# Patient Record
Sex: Female | Born: 1995 | Race: Black or African American | Hispanic: No | Marital: Single
Health system: Midwestern US, Academic
[De-identification: ages and names within clinical notes are randomized; demographics above are authoritative.]

---

## 2016-08-25 ENCOUNTER — Encounter: Admit: 2016-08-25 | Discharge: 2016-08-25 | Payer: MEDICARE

## 2016-09-01 ENCOUNTER — Encounter: Admit: 2016-09-01 | Discharge: 2016-09-01 | Payer: MEDICARE

## 2016-09-02 ENCOUNTER — Encounter: Admit: 2016-09-02 | Discharge: 2016-09-02 | Payer: MEDICARE

## 2016-09-02 NOTE — Telephone Encounter
New Referral Intake  TO PATIENT:  Thank you for contacting us, how did  you hear about our transplant program? [facility]      Is English your primary language? Yes      Who is your kidney doctor?  Dr. Mercie Eon  Who is your primary doctor?  Arlys John Tagget   Have you had a hospitalization in the past 6 months?  Yes, in [place] - [facility] - infection from PD     Parathyroidectomy - 2 or 3 years ago at [facility]: Lambert Rhonda W     Past Medical History:  (if answer is yes, please specify)    How tall are you?  5 5  How much do you weigh? 238    Calculated BMI:  39.6   What is the cause of your kidney disease? Kidneys stopped growing at 21years old , did biopsy didn't find anything   Are you currently on dialysis?  Yes    Type/Start date/ Schedule:  PD/08/04/2007   What center: [facility]    Are you diabetic?  No     at what age were you diagnosed?  Have you been denied or listed by another transplant center?  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   No   Do you have a heart doctor? No   History of heart stents?  No    Are you on medication to keep stents open (ex. Coumadin, Plavix, Brilinta)? No    Are you on medication to help raise your blood pressure (Midodrine)? No    Lung disease? No    Are you on oxygen? No    Do you wear CPAP (breathing machine at night to sleep)? CPAP in [place], currently not using it    Are you currently smoking (marijuana or cigerettes)? No   TO PATIENT: If you currently smoke, you need to quit to be considered a transplant candidate.  Liver disease?  No      Cancer? No   Any active infections? No     History of HIV or Hepatitis? No     open wounds or sores? 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  Yes in [place] - [place] Do you use a cane/walker/ other assistive devices to get around? No     Past Surgical History:    Have you had any abdominal surgeries? PD Cath. , Removed 2x and replaced. Previous kidney txp   Have you had any surgeries on blood vessels? No   Have you had any amputations?  No   Would you accept a lifesaving blood transfusion? Yes     Demographic and Social History:    What gender were you at birth?  Female   What gender do you identify with currently? Female   What race do you identify with? 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 No    If so, they are welcome to come to evaluation with you.    TO PATIENT: You will need to bring your support person with you to the transplant evaluation.  This is required to be considered a candidate for transplant.  If you have interested living donors, they are welcome to come with you, as well.       Note to staff -   Obtain the following records:  ??? nephrology note  ??? hospital discharge summary within 6 month (or most recent)   ??? pathology report    Notify Dr. Curt Bears if delay in scheduling due to lack of records     If patient has possible living donor - have them call:  ??? If recipient last name, Forrest Moron, [phone]  ??? If recipient last name Kelli Churn, [phone]

## 2016-09-08 ENCOUNTER — Encounter: Admit: 2016-09-08 | Discharge: 2016-09-08 | Payer: MEDICARE

## 2016-10-05 ENCOUNTER — Encounter: Admit: 2016-10-05 | Discharge: 2016-10-05 | Payer: MEDICARE

## 2016-10-05 DIAGNOSIS — N186 End stage renal disease: Principal | ICD-10-CM

## 2016-10-07 ENCOUNTER — Encounter: Admit: 2016-10-07 | Discharge: 2016-10-07 | Payer: MEDICARE

## 2016-10-08 ENCOUNTER — Encounter: Admit: 2016-10-08 | Discharge: 2016-10-08 | Payer: MEDICARE

## 2016-10-08 NOTE — Telephone Encounter
BENEFIT COLLECTION:     Verified by: Barbaraann Faster         Date:  October 08, 2016  ID #: [id number]    EFF:10/10/07 A&B   Subscriber:Self    Ins Plan: Medicare A&B    Phone#:[phone]   Plan Type:Traditional  MEDICARE A&B - 2018 BENEFIT SUMMARY:  PART A: DAYS REFRESH AFTER 60 CONSECUTIVE OUTPT DAYS  DAYS 1-60 $1340 DEDUCTIBLE  DAYS 61-90 $335/DAY COPAY  DAYS 91-150 (USING ANY LIFETIME RESERVE DAYS) $670/DAY COPAY  DAYS > 150 PT PORTION 100%; after a 3-day minimum medically necessary inpatient hospital stay for a related illness or injury.  PART A/ SNF BENEFITS: IN 2018: DAYS 1-20 PT PORTION $0; DAYS 21-100 $167.50/DAY COPAY; DAYS > 100 PT PORTION 100%  PART B IN 2018: - 2018 PREM $134  DEDUCTIBLE $183, WITH 80% REIMBURSEMENT OF ALLOWABLE, NO OUT OF POCKET MAXIMUM  MEDICARE WILL PAY FOR DRUGS INFUSED THROUGH AN ITEM OF DME, LIKE AN INFUSION PUMP, OR DRUGS GIVEN BY A NEBULIZER WHEN GIVEN BY A LICENSED MEDICAL PROVIDER.  PART B WILL COVER IMMUNOSUPPRESSIVE DRUGS IF [facility] COVERED THE TRANSPLANT, EVEN AS SECONDARY PAYER  No Case Management, No transplant Network, No prior authorizations, No benefit limits    RX Plan:[employer]etna       Phone #: [phone]   Drug Copay/ Coinsurance: $1.25 - $3.70   Current Subsidy: Full Benefit Dual Eligible Effective 02/09/16  Monthly Premium: $0  Annual Drug Deductible: $0     SECONDARY  ID #: [id number]   Subscriber:Self   Ins Plan:[employer]     Phone#:[phone]   Plan Type:[employer]  KANCARE (prev [place] Medicaid): No Spend Down as of 10/08/16  $48 INPT ADMIT COPAY  NURSING HOME/LONG TERM CARE [phone]  OUTPT HOSPITAL $3/VISIT  SURGICENTER $3/DOS  OUTPT REHAB $1/VISIT  DME $3/CLAIM  HHC $3/VISIT  NON-EMERGENCY TRANSPORT OR AMBULANCE $3/DOS  OV $2/COPAY  MENTAL HEALTH CALL [facility] [phone], $3/VISIT  DENTAL $3/DOS  RX $3/FILL (NO MAIL ORDER)  If 2ndry to Medicare: PAYS [place] FOR IMMUNOS AFTER PART B  **IS IT COVERED? CALL [phone]  NO AUTHORIZATION REQUIRED WHEN SECONDARY Medicare A&B : Benefits verified/appropriate for evaluation with Medicare A&B and [employer]. No auth required to activate.

## 2016-10-12 ENCOUNTER — Encounter: Admit: 2016-10-12 | Discharge: 2016-10-12 | Payer: MEDICARE

## 2016-10-13 ENCOUNTER — Encounter: Admit: 2016-10-13 | Discharge: 2016-10-13 | Payer: MEDICARE

## 2016-10-13 DIAGNOSIS — Z01818 Encounter for other preprocedural examination: Principal | ICD-10-CM

## 2016-10-19 ENCOUNTER — Encounter: Admit: 2016-10-19 | Discharge: 2016-10-19 | Payer: MEDICARE

## 2016-11-11 ENCOUNTER — Encounter: Admit: 2016-11-11 | Discharge: 2016-11-11 | Payer: MEDICARE

## 2016-11-11 NOTE — Telephone Encounter
TC from [place], RN at Dr Packett's office. Left VM , has questions, provided callback number.    TC to [place], Charity fundraiser. In a room with a patient, left callback number.

## 2016-11-16 ENCOUNTER — Encounter: Admit: 2016-11-16 | Discharge: 2016-11-16 | Payer: MEDICARE

## 2016-11-16 NOTE — Progress Notes
Interventional Radiology Outpatient Scheduling Checklist      1.  Procedure:   Portacath placement      2.  Date of Procedure:   11/22/16      3.  Arrival Time:   0645      4.  Procedure Time:  0800      5.  Correct Procedural Room Assignment:  Bell Room 3      6.  Blood Thinners Triaged and instructed per protocol: N/A      7.  Order Verified: Yes      8.  Patient informed regarding procedure: Yes      9.  Patient instructed to have a driver:  Yes     10.  Patient instructed on NPO status: No solids after midnight, clear liquids until 0600, NPO from 0600 until after procedure     11.  Specimen needed: Y/N: NA     12.  Allergies Verified:  Y/N:  Yes     13.  Iodine Allergy: Y/N:  If yes and receiving Iodine has the patient been premedicated: Y/N: No     14.  Does the patient have labs according to IR procedural policy: Y/N: [facility] dialysis is drawing labs 11/17/16, will fax results to IR     15.  Will the patient need to be admitted/possible admission: Y/N:   NA     16.  If YES to possible admission has the patient been instructed: Y/N: NA     17.  Patient States Understanding:  Y/N  Yes     18.  Hx OSA:  Y/N: Pt instructed to bring PAP Machine:Yes, had a CPAP but left it in [place]

## 2016-11-22 ENCOUNTER — Ambulatory Visit: Admit: 2016-11-22 | Discharge: 2016-11-22 | Payer: MEDICARE

## 2016-11-22 DIAGNOSIS — N186 End stage renal disease: Principal | ICD-10-CM

## 2016-11-22 DIAGNOSIS — I871 Compression of vein: ICD-10-CM

## 2016-11-22 DIAGNOSIS — Z4901 Encounter for fitting and adjustment of extracorporeal dialysis catheter: Secondary | ICD-10-CM

## 2016-11-22 MED ORDER — MIDAZOLAM 1 MG/ML IJ SOLN
1 mg | Freq: Once | INTRAVENOUS | 0 refills | Status: CP
Start: 2016-11-22 — End: ?
  Administered 2016-11-22: 14:00:00 1 mg via INTRAVENOUS

## 2016-11-22 MED ORDER — MIDAZOLAM 1 MG/ML IJ SOLN
0 refills | Status: CP
Start: 2016-11-22 — End: ?
  Administered 2016-11-22 (×2): 1 mg via INTRAVENOUS

## 2016-11-22 MED ORDER — IOPAMIDOL 61 % IV SOLN
20 mL | Freq: Once | INTRAVENOUS | 0 refills | Status: CP
Start: 2016-11-22 — End: ?
  Administered 2016-11-22: 16:00:00 20 mL via INTRAVENOUS

## 2016-11-22 MED ORDER — ONDANSETRON HCL (PF) 4 MG/2 ML IJ SOLN
4 mg | Freq: Once | INTRAVENOUS | 0 refills | Status: CP
Start: 2016-11-22 — End: ?
  Administered 2016-11-22: 16:00:00 4 mg via INTRAVENOUS

## 2016-11-22 MED ORDER — FENTANYL CITRATE (PF) 50 MCG/ML IJ SOLN
0 refills | Status: CP
Start: 2016-11-22 — End: ?
  Administered 2016-11-22 (×2): 50 ug via INTRAVENOUS

## 2016-11-22 MED ORDER — CEFAZOLIN INJ 1GM IVP
2 g | Freq: Once | INTRAVENOUS | 0 refills | Status: CP
Start: 2016-11-22 — End: ?
  Administered 2016-11-22: 16:00:00 2 g via INTRAVENOUS

## 2016-11-22 MED ORDER — FENTANYL CITRATE (PF) 50 MCG/ML IJ SOLN
50 ug | Freq: Once | INTRAVENOUS | 0 refills | Status: CP
Start: 2016-11-22 — End: ?
  Administered 2016-11-22: 14:00:00 50 ug via INTRAVENOUS

## 2016-11-22 NOTE — Progress Notes
Sedation physician present in room.  Recent vitals and patient condition reviewed between sedating physician and nurse.  Reassessment completed.  Determination made to proceed with planned sedation.

## 2016-11-22 NOTE — Progress Notes
OK to be d/c'd home per MD order protocol. Pt is awake and alert. Denies pain. O2 sats >97% on RA. NSR on telemetry. Tolerating food and fluids well. Dressing is C/D/I. Pt verbalized understanding of d/c instructions. Advised to call the MD for any questions or concerns or return to ER if fever or significant pain develops. Family to drive pt home. Pt will have IV removed and will be transported downstairs to lobby via wheelchair with all belongings.

## 2016-11-22 NOTE — Other
Immediate Post Procedure Note    Date:  11/22/2016                                         Attending Physician:   Levora Angel, MD  Performing Provider:  Merla Riches, DO    Consent:  Consent obtained from patient.  Time out performed: Consent obtained, correct patient verified, correct procedure verified, correct site verified, patient marked as necessary.  Indications: ESRD, need for venous access    Anesthesia: Conscious Sedation  Procedure(s):  portacath placement  Findings:  Right and left IJs occluded. Placed via right innominate vein. See PACS dictation for additional description.     Estimated Blood Loss:  Minimal  Specimen(s) Removed/Disposition:  None  Complications: None  Patient Tolerated Procedure: Well  Post-Procedure Condition:  stable    Merla Riches, DO  Pager [phone]

## 2016-11-22 NOTE — H&P (View-Only)
Pre Procedure History and Physical/Sedation Plan-OP    Procedure Date: 11/22/2016     Planned Procedure(s):  Port placement    Indication for exam:  End Stage Renal disease, need for venous access   __________________________________________________________________    Chief Complaint:  See above    History of Present Illness: Debra Lam is a 21 y.o. female that presents to IR today for port placement. Patient denies complaint on exam.     There are no active problems to display for this patient.    No past medical history on file.   No past surgical history on file.   Prescriptions Prior to Admission   Medication Sig Dispense Refill Last Dose   ??? amLODIPine (NORVASC) 10 mg tablet Take 10 mg by mouth daily.   11/22/2016   ??? calcitriol (ROCALTROL) 0.5 mcg capsule Take 0.5 mcg by mouth twice daily.   11/22/2016   ??? calcium acetate (PHOSLO) 667 mg capsule Take 1,334 mg by mouth three times daily with meals.      ??? carvedilol (COREG) 25 mg tablet Take 25 mg by mouth twice daily with meals. Take with food.   Past Week   ??? fosinopril (MONOPRIL) 20 mg tablet Take 20 mg by mouth daily.   11/22/2016   ??? polyethylene glycol 3350 (MIRALAX) 17 g packet Take 17 g by mouth daily.      ??? potassium chloride SR (K-DUR) 10 mEq tablet Take 10 mEq by mouth daily. Take with a meal and a full glass of water.   11/22/2016   ??? trimethoprim/sulfamethoxazole (BACTRIM SS) 80/400 mg tablet Take 1 tablet by mouth daily.   11/22/2016     Allergies   Allergen Reactions   ??? Advair Diskus [Fluticasone-Salmeterol] ANAPHYLAXIS   ??? Adhesive Tape (Rosins) RASH   ??? Coumadin [Warfarin] RASH       Social History:   Social History   Substance Use Topics   ??? Smoking status: Not on file   ??? Smokeless tobacco: Not on file   ??? Alcohol use Not on file      No family history on file.     Review of Systems  Constitutional: negative  Respiratory: negative  Cardiovascular: negative  Gastrointestinal: negative Previous Anesthetic/Sedation History:  Patient denies adverse event.     Physical Exam:  Vital Signs: Last Filed In 24 Hours Vital Signs: 24 Hour Range   BP: 141/92 (10/15 0752)  Temp: 36.7 ???C (98.1 ???F) (10/15 0752)  Pulse: 98 (10/15 0752)  Respirations: 18 PER MINUTE (10/15 0752)  SpO2: 100 % (10/15 0752)  O2 Delivery: None (Room Air) (10/15 0752)  Height: 160 cm (63) (10/15 0752) BP: (141)/(92)   Temp:  [36.7 ???C (98.1 ???F)]   Pulse:  [98]   Respirations:  [18 PER MINUTE]   SpO2:  [100 %]   O2 Delivery: None (Room Air)            General appearance: alert and cooperative  Head and Neck: no abnormalities noted  Mouth: no abnormalities noted   Neurologic: Grossly normal  Lungs: nonlabored  Heart: regular rate and rhythm        Airway:  airway assessment performed  Mallampati III (soft palate, base of uvula visible)   Anesthesia Classification:  ASA III (A patient with a severe systemic disease that limits activity, but is not incapacitating)  Sedation/Medication Plan: Fentanyl and Midazolam  Personal history of sedation complications: Denies adverse event.   Family history of sedation complications: Denies adverse  event.   Medications for Reversal: Naloxone and Flumazenil  Discussion/Reviews:  Physician has discussed risks and alternatives of this type of sedation and above planned procedures with patient  NPO Status: Acceptable  Pregnancy Status: Denies chance of pregnancy        Lab/Radiology/Other Diagnostic Tests:  Labs:  Pertinent labs reviewed - hgb 9.1 on 10/10           Ski Polich Nugent, [facility]  Pager [phone]

## 2016-12-01 ENCOUNTER — Encounter: Admit: 2016-12-01 | Discharge: 2016-12-01 | Payer: MEDICARE

## 2016-12-06 ENCOUNTER — Encounter: Admit: 2016-12-06 | Discharge: 2016-12-06 | Payer: MEDICARE

## 2016-12-06 ENCOUNTER — Ambulatory Visit: Admit: 2016-12-06 | Discharge: 2016-12-06 | Payer: MEDICARE

## 2016-12-06 DIAGNOSIS — E1129 Type 2 diabetes mellitus with other diabetic kidney complication: ICD-10-CM

## 2016-12-06 DIAGNOSIS — N186 End stage renal disease: Principal | ICD-10-CM

## 2016-12-06 DIAGNOSIS — Z01818 Encounter for other preprocedural examination: ICD-10-CM

## 2016-12-06 DIAGNOSIS — Z114 Encounter for screening for human immunodeficiency virus [HIV]: ICD-10-CM

## 2016-12-06 DIAGNOSIS — Z0389 Encounter for observation for other suspected diseases and conditions ruled out: ICD-10-CM

## 2016-12-06 LAB — COMPREHENSIVE METABOLIC PANEL
Lab: 11 U/L (ref 7–56)
Lab: 137 MMOL/L (ref 137–147)
Lab: 14 U/L (ref 7–40)
Lab: 3.8 g/dL (ref 3.5–5.0)
Lab: 5.3 MMOL/L — ABNORMAL HIGH (ref 3.5–5.1)
Lab: 7.9 g/dL (ref 6.0–8.0)

## 2016-12-06 LAB — CBC AND DIFF
Lab: 28 % — ABNORMAL LOW (ref 36–45)
Lab: 29 pg (ref 26–34)
Lab: 3.1 M/UL — ABNORMAL LOW (ref 4.0–5.0)
Lab: 4.7 10*3/uL (ref 4.5–11.0)

## 2016-12-06 LAB — PROTIME INR (PT): Lab: 1.1 (ref 0.8–1.2)

## 2016-12-06 LAB — PHOSPHORUS: Lab: 10 mg/dL — ABNORMAL HIGH (ref >60)

## 2016-12-06 NOTE — Telephone Encounter
Financial Transplant Evaluation    Debra Lam met with Debra Lam, mom, brother and sister in law  for insurance consult. There appear to be no coverage/insurance barriers to transplant with Medicare ABD full subsidized with [employer] no spend down plan. Debra Lam expressed no insurance concerns related to cost of post-transplant care and medications and verbalized understanding of Transplant Finance Coordinator's discussion and documents presented. Advised patient to call me if any insurance changes. Provided patient with my contact information. Patient verbalized understanding. NO Financial Assessment given due to NO financial liabilities. NO Authorization required prior to listing.    Documents presented:  Signed Patient Liability Form, Medication Cost Estimate. MM 12/06/16

## 2016-12-06 NOTE — Progress Notes
Pharmacy Kidney Transplant Evaluation   I met with Debra Lam for their pharmacy transplant evaluation. A medication history and reconciliation were performed (including prescription medications, supplements, over the counter, and herbal products). The medication list was updated and the patient???s current medication list is included below.     Home Medications    Medication Sig   amLODIPine (NORVASC) 10 mg tablet Take 10 mg by mouth daily.   calcitriol (ROCALTROL) 0.5 mcg capsule Take 1 mcg by mouth daily.   calcium acetate (PHOSLO) 667 mg capsule Take 1,334 mg by mouth three times daily with meals.   carvedilol (COREG) 25 mg tablet Take 25 mg by mouth twice daily with meals. Take with food.   cetirizine (ZYRTEC) 5 mg tablet Take 5 mg by mouth every morning.   fosinopril (MONOPRIL) 40 mg tablet Take 40 mg by mouth daily.   polyethylene glycol 3350 (MIRALAX) 17 g packet Take 17 g by mouth daily as needed.   potassium chloride SR (K-DUR) 10 mEq tablet Take 10 mEq by mouth daily. Take with a meal and a full glass of water.        Medication Adherence:   Key (0= Never, 1= Rarely, 2= Sometimes, 3= Often, 4= Always)    Method utilized to manage medications: Pillbox for whole     How often do you forget to take one or more of your prescription medications? 0, states lately hasn't forgotten to take medication    How often do you decide not to take one or more of your prescription medications? 0    How often do you run out of one or more of your prescription medications before getting a new refill? 1, usually doesn't run out of medications but has been out of calcitriol not that long (about a week) but has not called to follow up with medications. Other medications are on automatic refill.     How often do you have difficulty swallowing medications or taking your medications? 0    How often do you have difficulty affording your prescription medications? 0 How often does your pharmacy have difficulty filing your prescription medications? 1, [facility] delayed calcitriol delivery; other medications are on automatic     Patient Education:  During this visit the patient was provided with an overview of therapies that will be initiated post-transplant, including but not limited to significant side effects and dosing schedules.Because this patient is a female of child-bearing potential, there is risk for fetal toxicity associated with mycophenolate use during pregnancy. REMS acknowledgement and documentation were previously completed and the patient was reminded of the importance of pregnancy prevention while taking mycophenolate.    Emphasis was placed on the importance of medication adherence both pre and post-transplant.  The patient was encouraged to contact the transplant pharmacist with any follow up questions.   Debra Lam expressed understanding of the information discussed.     Assessment:  Comments: Patient reports good medication compliance however she has not been taking calcitriol for at least a week and has not followed up with her pharmacy to obtain further refills. Patient also has a history of prior transplant however family and patient did not remember any medications patient was on previously.  Potential Drug Interactions: None  Potential Barriers to Transplantation: Medication compliance  Recommendation: Medication compliance should be address prior to transplantation.    Nonnie Done, PharmD, BCPS  Pager: [phone]  Office: [phone]

## 2016-12-06 NOTE — Progress Notes
12/06/16      Debra Lam    Dear Dr. Mercie Lam,    We had pleasure of meeting Debra Lam in our [facility] for evaluation for Kidney transplant. Enclosed are details from our visit.    HPI:    Debra Lam is a 21 y.o. 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 We do not have these records yet.     Mother, sister in law and brother are present for transplant evaluation today. Patient previously lived in [place], [place]. Moved to [facility] area in April 2018 to live with brother.     She was evaluated at [facility] but not listed for kidney transplant due to BMI of 38. BMI here today is 119.7 kg due to additional fluid gains. She performed echocardiogram but did not complete stress test (was not NPO).    Last hospitalization was 10/15 for port placement. She has limited peripheral venous access. Currently receiving IV iron.     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 One daytime well  EDW: Was 105 kg. Fluid gains have been increasing due to low prescription bags. Current weight 119.7 kg.   Urine output: Nil    History of PE   2016, likely provoked while on OCP  Allergy listed to coumadin  Was on eliquis, not currently on anticoagulation  No hypercoagulable panel reported    Sleep apnea  Has machine but needs tubing replacement    History of parathyroidectomy   2016  On phoslo    H/o perirectal abscess 2016  H/o perineal abscess- req I&D/ IV abx 2017    Childhood asthma, resolved  Currently on zyrtect    PUD  Endoscopy 2017, duodenitis    Screening  Pap smear October 2018, reported as normal Past Surgical History  PD catheter  Right chest port  kidney transplant  Parathyroidectomy    REVIEW OF SYSTEMS   Pre-Transplant Considerations:  CAD: No  Stroke: No  Cancer: No}  Liver Disease/Hepatitis:No  OSA/Pulmonary HTN: Yes  Midodrine use: No  Sensitizing Events: Transfusion, transplant  DVT: Yes, PE, on OCP  Nephrotic Syndrome:No  Weight Loss needed: Yes, goal 105 kg    General: Denies fever/chills  Skin: Denies rash. +dry skin  HENT: Denies cough, visual changes  Pulmonary: Denies dyspnea, wheezes  Gastrointestinal: Denies GERD and Diarrhea. History of duodenitis on EGD, currently asymptomatic  Genitourinary: Denies urinary tract infection and hematuria  Neurologic: Denies Weakness and Stroke  Psychological: Denies Depression and Anxiety  Hematologic/Lymphatic: Denies Bleeding. History of dvt/pe  Viral infections/ID: Denies history of shingles and herpes    Exercise Tolerance:  Debra Lam does not exercise routinely. She is able to ascend 2 flights of stairs without chest pain, shortness of breath, orthopnea, PND, claudication or resting leg pain.    All other pertinent ROS as above or negative      Mammogram Not applicable    Pap Smear Yes  Last performed: October 2018    Social History  Social History     Social History   ??? Marital status: Single     Spouse name: N/A   ??? Number  of children: 0   ??? Years of education: 65     Occupational History   ???       unemployed     Social History Main Topics   ??? Smoking status: Never Smoker   ??? Smokeless tobacco: Never Used   ??? Alcohol use No   ??? Drug use: No   ??? Sexual activity: Yes     Other Topics Concern   ??? Blood Transfusions Yes   ??? Exercise No     Social History Narrative   ??? No narrative on file       Family History  Family History   Problem Relation Age of Onset   ??? Hypertension Mother    ??? None Reported Brother          Allergies:   Allergies   Allergen Reactions   ??? Advair Diskus [Fluticasone-Salmeterol] ANAPHYLAXIS and HIVES ??? Adhesive Tape (Rosins) RASH   ??? Coumadin [Warfarin] RASH     Bleeding lesions           Current Outpatient Prescriptions:   ???  amLODIPine (NORVASC) 10 mg tablet, Take 10 mg by mouth daily., Disp: , Rfl:   ???  calcitriol (ROCALTROL) 0.5 mcg capsule, Take 1 mcg by mouth daily., Disp: , Rfl:   ???  calcium acetate (PHOSLO) 667 mg capsule, Take 1,334 mg by mouth three times daily with meals., Disp: , Rfl:   ???  carvedilol (COREG) 25 mg tablet, Take 25 mg by mouth twice daily with meals. Take with food., Disp: , Rfl:   ???  cetirizine (ZYRTEC) 5 mg tablet, Take 5 mg by mouth every morning., Disp: , Rfl:   ???  fosinopril (MONOPRIL) 40 mg tablet, Take 40 mg by mouth daily., Disp: , Rfl:   ???  polyethylene glycol 3350 (MIRALAX) 17 g packet, Take 17 g by mouth daily as needed., Disp: , Rfl:   ???  potassium chloride SR (K-DUR) 10 mEq tablet, Take 10 mEq by mouth daily. Take with a meal and a full glass of water., Disp: , Rfl:     CBC w/Diff    No results found for: WBC, RBC, HGB, HCT, MCV, MCH, MCHC, RDW, PLTCT, MPV No results found for: NEUT, ANC, LYMA, ALC, MONA, AMC, EOSA, AEC, BASA, ABC     Comprehensive Metabolic Profile    No results found for: NA, K, CL, CO2, GAP, BUN, CR, GLU No results found for: CA, PO4, ALBUMIN, TOTPROT, ALKPHOS, AST, ALT, TOTBILI, GFR, GFRAA     Urine    No results found for: UCOLOR, TURBID, USPGR, UPH, UPROTEIN, UAGLU, UKET, UBILE, UBLD, UROB No results found for: UNIT, ULEU, SULFSALAC, ICTOTEST, EHRLICH, UWBC, URBC, UCOMMENT     No results found for this or any previous visit.    Physical Exam:  Vitals:    12/06/16 1229 12/06/16 1230   BP: 136/75 118/74   Pulse: 81 86   Temp: 36.3 ???C (97.4 ???F)    TempSrc: Oral    SpO2: 100%    Weight: 119.7 kg (264 lb)    Height: 162.6 cm (64)      General:NAD, Alert and Oriented x3, calm and pleasant  HEENT exam: Moist Mucus Membranes, No oral lesions  Neck: Normal ROM, no LAD, no JVD. No carotid bruit is present  Heart: RRR, S1, S2, without murmur, rub, or gallop Lungs: Clear to auscultation bilaterally without wheezes or rhonchi  Abdomen: Soft, large pannus. Non tender abdomen without hepatosplenomegaly  RLQ retained allograft.  Neuro: Nonfocal deficits without tremors  M/S: Normal ROM and strength  Ext: 1+ edema is present in the lower extremities.   2+ dorsalis pedis pulses are present  Vascular: 2+ femoral pulses are present  Psyc: Appropriate affect and mood         Assessment and Plan:    Debra Lam is a 21 y.o. year old with ESRD from congenital/hypoplastic kidneys. 2008 kidney transplant failed quickly due to adenovirus and rejection after reducing immunosuppression.    Debra Lam is a reasonable candidate for a Kidney transplant.    Potential living donors: Yes, aunts     Potential barriers for Transplantation:  Multiple medical comorbidites, Body habitus and Psychosicial issues (low MOCA), prior transplant with likely sensitization    Plan for second transplant to go in [place].     Social support seems adequate from brother, sister in law and mother (mother lives in [place]).    ESRD  Hypoplastic kidneys per report.  Will request records from previous transplant.    Cardiovascular risk stratification  03/2016 Echo grade 2 diastolic dysfunction, EF 65%  Needs stress testing.    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 Recommend neuropsych testing for clarification. Social support seems adequate.    Imaging  Will obtain MRI[facility] from [facility]       I explained the advantages of transplantation over dialysis to the patient. I explained the advantages of living kidney donation over deceased donation. I reviewed immunosuppression and the increased risk of infections, diabetes, and malignancy post-transplantation. Deane Tomecia Taha was accompanied by her mother, brother, and sister in law.   We also discussed KDPI scores and new allocation system. The use, compliance with meds and side effects of immunosuppression including cancer and infection were also discussed. The risk of primary graft non-function, SGF, DGF, rejection, primary disease recurrence were also discussed. The patient demonstrated a full and adequate understanding and denied any questions or concerns at this time.    Marcena Legna Laskowski was informed they have a right to refuse any kidney that is offered to them for transplant without affecting their status on the wait list.    We will complete the evaluation process and discuss candidacy in the Transplant Selection Committee for a final decision.    Thank you for giving [place] the opportunity in taking care of this patient.  Please do not hesitate to contact us with any questions or concerns that you may have.    [phone]      Sincerely,  Angelita Ingles, MD    [facility]  CC: [place]

## 2016-12-06 NOTE — Progress Notes
Financial Transplant Evaluation    Debra Lam met with Debra Lam, mom, brother and sister in law  for insurance consult. There appear to be no coverage/insurance barriers to transplant with Medicare ABD full subsidized with [employer] no spend down plan. Debra Lam expressed no insurance concerns related to cost of post-transplant care and medications and verbalized understanding of Transplant Finance Coordinator's discussion and documents presented. Advised patient to call me if any insurance changes. Provided patient with my contact information. Patient verbalized understanding. NO Financial Assessment given due to NO financial liabilities. NO Authorization required prior to listing.    Documents presented:  Signed Patient Liability Form, Medication Cost Estimate. MM 12/06/16

## 2016-12-06 NOTE — Progress Notes
Date of Service: 12/06/2016     Subjective:             Debra Lam is a 21 y.o. female.    History of Present Illness  Hx of HTN, congenital hypoplastic kidneys, on HD since 07/23/2007 had a decease donor kidney transplant in 2008-07-22 failed due to rejection and potentially due to non compliance, she is currently on PD due to exhausted UE dialysis access, morbid obesity and sleep apnea, she is here for re do kidney transplant evaluation       Review of Systems       Past Medical History  ESRD diagnosis presumed hypoplastic kidneys, unclear if underlying GN  Dialysis Initiation Date: 07/23/07  ???  History of PE   07/23/14, likely provoked while on OCP  Allergy listed to coumadin  Was on eliquis, not currently on anticoagulation  No hypercoagulable panel reported  ???  Sleep apnea  Has machine but needs tubing replacement  ???  History of parathyroidectomy   07-23-2014  On phoslo  ???  H/o perirectal abscess 07-23-2014  H/o perineal abscess- req I&D/ IV abx Jul 23, 2015  ???  Childhood asthma, resolved  Currently on zyrtect  ???  PUD  Endoscopy Jul 23, 2015, duodenitis  ???  Screening  Pap smear October 2018, reported as normal  ???  ???  ???  Past Surgical History  PD catheter  Right chest port  kidney transplant  Parathyroidectomy      Family History   Problem Relation Age of Onset   ??? Hypertension Mother    ??? None Reported Brother      Social History     Social History   ??? Marital status: Single     Spouse name: N/A   ??? Number of children: 0   ??? Years of education: 87     Occupational History   ???       unemployed     Social History Main Topics   ??? Smoking status: Never Smoker   ??? Smokeless tobacco: Never Used   ??? Alcohol use No   ??? Drug use: No   ??? Sexual activity: Yes     Other Topics Concern   ??? Blood Transfusions Yes   ??? Exercise No     Social History Narrative   ??? No narrative on file           Objective:   Allergies as of 12/06/2016 - Reviewed 12/06/2016   Allergen Reaction Noted   ??? Advair diskus [fluticasone-salmeterol] ANAPHYLAXIS and HIVES 10/08/2016 ??? Adhesive tape (rosins) RASH 10/08/2016   ??? Coumadin [warfarin] RASH 10/08/2016           ??? amLODIPine (NORVASC) 10 mg tablet Take 10 mg by mouth daily.   ??? calcitriol (ROCALTROL) 0.5 mcg capsule Take 1 mcg by mouth daily.   ??? calcium acetate (PHOSLO) 667 mg capsule Take 1,334 mg by mouth three times daily with meals.   ??? carvedilol (COREG) 25 mg tablet Take 25 mg by mouth twice daily with meals. Take with food.   ??? cetirizine (ZYRTEC) 5 mg tablet Take 5 mg by mouth every morning.   ??? fosinopril (MONOPRIL) 40 mg tablet Take 40 mg by mouth daily.   ??? polyethylene glycol 3350 (MIRALAX) 17 g packet Take 17 g by mouth daily as needed.   ??? potassium chloride SR (K-DUR) 10 mEq tablet Take 10 mEq by mouth daily. Take with a meal and a full glass of water.  Vitals:    12/06/16 1235 12/06/16 1236   BP: 136/75 118/74   Pulse: 81 86   Temp: 36.3 ???C (97.4 ???F)    TempSrc: Oral    SpO2: 100%    Weight: 119.7 kg (264 lb)    Height: 162.6 cm (64)      Body mass index is 45.32 kg/m???.     Physical Exam    No results found for any previous visit.            Assessment and Plan:    21 y.o. female with past medical history significant for Hx of HTN, congenital hypoplastic kidneys, on HD since July 28, 2007 had a decease donor kidney transplant in 2008/07/27 failed due to rejection and potentially due to non compliance, she is currently on PD due to exhausted UE dialysis access, morbid obesity and sleep apnea, she is here for re do kidney transplant evaluation    Plan: The patient was seen and examined. she is currently not a good surgical candidate due to central obesity, and volume overload. I went ahead in discussed with the patient surgical aspects of kidney transplant, life commitment as well as elaborated on deceased versus living donor transplants, immunosuppression, risks and benefits in detail.  We will follow the patient's CT scan. Change PD to hemodialysis if possible,  The patient will be discussed in our selection committee. York Cerise, MD

## 2016-12-07 LAB — HIV 1& 2 AG-AB SCRN W REFLEX HIV 1 PCR QUANT: Lab: NEGATIVE MMOL/L — ABNORMAL LOW (ref 21–30)

## 2016-12-07 LAB — HEPATITIS B CORE AB TOT (IGG+IGM): Lab: NEGATIVE mg/dL — ABNORMAL HIGH (ref 7–25)

## 2016-12-07 LAB — HEPATITIS C ANTIBODY W REFLEX HCV PCR QUANT: Lab: NEGATIVE U/L — ABNORMAL HIGH (ref 25–110)

## 2016-12-07 LAB — HEMOGLOBIN A1C: Lab: 5.1 % (ref 4.0–6.0)

## 2016-12-07 LAB — HEPATITIS B SURFACE AG: Lab: NEGATIVE mg/dL (ref 0.3–1.2)

## 2016-12-07 LAB — HEPATITIS B SURFACE AB: Lab: 36 m[IU]/mL — ABNORMAL LOW (ref 8.5–10.6)

## 2016-12-07 LAB — SYPHILIS AB SCREEN: Lab: NEGATIVE

## 2016-12-07 NOTE — Progress Notes
Renal Transplant Nutrition Evaluation      Impressions: Patient has excessive abdominal obesity, would benefit on weight loss. She also does not follow her fluid or diet restrictions. Otherwise, I see no nutritional contraindications to transplantation at this time. Pt has an appropriate body habitus for RTx. Body mass index is 45.32 kg/m.  Estimated body mass index is 45.32 kg/m as calculated from the following:    Height as of this encounter: 162.6 cm (64").    Weight as of this encounter: 119.7 kg (264 lb).                  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 She was on dialysis (PD) 06/2007 and had a previous kidney transplant in 2008 that failed shortly after transplant due to adenovirus infection followed by rejection. Pt reports she has been trying to lose weight, but recently has been gaining weight, which she suspects is related to fluid retention. 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 Once there she snacks on chicken tenders with ranch, onion rings, mashed potatoes, macaroni and cheese, cookies and unlimited regular soda. Discussed eating before she goes to work, picking healthier options while she is there, along with limiting her soda intake. Encouraged >64 fluid ounces of fluid daily along with decreasing her sodium intake to prevent further fluid retention. Pt verbalized understanding.        Instructed on post-transplant nutrition related expectations. Discussed importance of hand hygiene, food safety, grapefruit avoidance.    Goals: Weight Loss    MD referral received on 12/07/2016 for medical nutrition therapy services.    Lajuana Carry, MS, RD, LD *[phone]

## 2016-12-08 ENCOUNTER — Encounter: Admit: 2016-12-08 | Discharge: 2016-12-08 | Payer: MEDICARE

## 2016-12-08 NOTE — Committee Review
Committee Review Note     Evaluation Date: 12/06/2016  Committee Review Date: 12/08/2016    Organ being evaluated for: Kidney    Transplant Phase: Evaluation  Transplant Status:  Active    Transplant Coordinator: Francee Piccolo  Transplant Surgeon:       Referring Physician: Malachy Chamber    Primary Diagnosis:   Secondary Diagnosis:     Committee Review Members:  Dietitian Lajuana Carry   Finance Mayra Alwyn Pea   Licensed Clinical Social Worker [facility]   Nephrology Sadie Haber, MD, Geri Seminole, MD, Angelita Ingles, MD, Pamella Pert, MD, Martyn Ehrich, APRN   Pharmacist Nonnie Done, [place]   Psychiatry Vanita Panda, MD   Transplant Carolee Rota, RN, Ralph Dowdy, RN, Alger Simons, RN, Sandria Senter, Vernia Buff, RN   Transplant Surgery Rodney Cruise, MD, Alfredo Bach, MD, Elayne Snare, MD       Transplant Eligibility: Adults age 53 or older.  A minor may be considered on an individual basis, End Stage Renal Disease as defined by CrCl or GFR of less than or equal to 20 mL/min; or currently on dialysis    Committee Review Decision: Deferred    Relative Contraindications: BMI > 40    Absolute Contraindications:     Committee Discussion Details: hypoplastic kidneys; txp in 2008? RLE, failed d/t Adenovirus; tried to get listed at [facility], BMI too high (45); on PD; PE in 2016- oral contraception; no functional limitations; +potential LD's; PRA 97%; Surg- SOA sitting there, volume issue; Dietitian- drinks pop/ eats fried foods; SW- does "ok"; may need to switch to ; low [facility]- Neuro Psych?    Action Items: DEFER- needs: Weight to 105 kilos; stress test eventually; Dr Graciela Husbands to talk to Dr Mercie Eon re: HD; possibly Neuro Psych    Financial Plan Submitted?: No    Financial Plan Approved?: Not applicable     Is Financial Auth Needed?: No    Recommended for KDPI > 85%:  Not applicable    Does patient need to return to committee?: Yes Reason: Address action item(s)

## 2016-12-08 NOTE — Telephone Encounter
Selection Meeting Preparation    Insurance has been verified though [employer].  As of 12/08/16 patient has active Medicare ABD. Ref.#:[id number]. With   [employer] No Spend Down. Ref.#: [id number].  *NO Financial Assessment Requried No OOP  *No Auth Needed for Listing  *No Financial Concerns / Barriers  MM 101/31/18

## 2016-12-09 ENCOUNTER — Encounter: Admit: 2016-12-09 | Discharge: 2016-12-09 | Payer: MEDICARE

## 2016-12-09 LAB — EPSTEIN BARR  PANEL(EBV)
Lab: NEGATIVE mg/dL — ABNORMAL HIGH (ref 0.4–1.00)
Lab: POSITIVE MMOL/L — ABNORMAL LOW (ref 98–110)
Lab: POSITIVE mg/dL (ref 70–100)

## 2016-12-09 LAB — CMV AB IGM: Lab: POSITIVE FL — AB (ref 80–100)

## 2016-12-09 LAB — HERPES SIMPLEX IGG AB (HSV IGG)
Lab: POSITIVE mL/min — AB
Lab: POSITIVE — AB (ref 3–12)

## 2016-12-09 LAB — CMV AB IGG: Lab: POSITIVE g/dL — ABNORMAL LOW (ref 12.0–15.0)

## 2016-12-09 NOTE — Telephone Encounter
Called patient and no answer. VM was full and could not leave a message.  Attempted the other number listed, and LVM for call back.    Letters completed and provided to MA to be mailed.    Transplant tab changed to appropriate phase.

## 2016-12-10 LAB — TOXICOLOGY,SERUM
Lab: NEGATIVE
Lab: NEGATIVE

## 2016-12-20 ENCOUNTER — Encounter: Admit: 2016-12-20 | Discharge: 2016-12-20 | Payer: MEDICARE

## 2016-12-21 ENCOUNTER — Encounter: Admit: 2016-12-21 | Discharge: 2016-12-21 | Payer: MEDICARE

## 2016-12-24 ENCOUNTER — Encounter: Admit: 2016-12-24 | Discharge: 2016-12-24 | Payer: MEDICARE

## 2016-12-24 DIAGNOSIS — N186 End stage renal disease: Principal | ICD-10-CM

## 2016-12-24 NOTE — Progress Notes
Interventional Radiology Outpatient Scheduling Checklist      1.  Procedure:   Hemodialysis Catheter Placement      2.  Date of Procedure:   01/03/17      3.  Arrival Time:  0900       4.  Procedure Time:1000       5.  Correct Procedural Room Assignment:  ICC Rm # 1      6.  Blood Thinners Triaged and instructed per protocol: N/A      7.  Order Verified: Yes      8.  Patient informed regarding procedure: Yes      9.  Patient instructed to have a driver:  Yes     10.  Patient instructed on NPO status: No solids 8 hours prior to procedure 0200, clear liquids till 2 hours prior to procedure 0800, then @ 2 hours prior to procedure NPO.      11.  Specimen needed: Y/N: No     12.  Allergies Verified:  Y/N:  Yes     13.  Iodine Allergy: Y/N:  If yes and receiving Iodine has the patient been premedicated: Y/N: No     14.  Does the patient have labs according to IR procedural policy: Y/N: Yes     15.  Will the patient need to be admitted/possible admission: Y/N:   NA     16.  If YES to possible admission has the patient been instructed: Y/N: NA     17.  Patient States Understanding:  Y/N  Yes     18.  Hx OSA:  Y/N: Pt instructed to bring PAP Machine:Patient on Oxygen. Instructed to bring CPAP.

## 2016-12-24 NOTE — Progress Notes
Interventional Radiology Outpatient Scheduling Checklist      1.  Procedure:   Hemodialysis Catheter Placement      2.  Date of Procedure: 12/27/16        3.  Arrival Time:  1000       4.  Procedure Time:  1100      5.  Correct Procedural Room Assignment:  ICC Rm # 2      6.  Blood Thinners Triaged and instructed per protocol: N/A      7.  Order Verified: Yes      8.  Patient informed regarding procedure: Yes      9.  Patient instructed to have a driver:  Yes     10.  Patient instructed on NPO status: No solids 8 hours prior to procedure 0300, clear liquids till 2 hours prior to procedure 0900, then @ 2 hours prior to procedure NPO.      11.  Specimen needed: Y/N: No     12.  Allergies Verified:  Y/N:  Yes     13.  Iodine Allergy: Y/N:  If yes and receiving Iodine has the patient been premedicated: Y/N: No     14.  Does the patient have labs according to IR procedural policy: Y/N: Yes     15.  Will the patient need to be admitted/possible admission: Y/N:   NA     16.  If YES to possible admission has the patient been instructed: Y/N: NA     17.  Patient States Understanding:  Y/N  Yes     18.  Hx OSA:  Y/N: Pt instructed to bring PAP Machine:N/A

## 2017-01-03 ENCOUNTER — Encounter: Admit: 2017-01-03 | Discharge: 2017-01-03 | Payer: MEDICARE

## 2017-01-03 NOTE — Telephone Encounter
Pt called to cancel appointment today and reschedule.  The dialysis catheter insertion was rescheduled for 01/07/2017 at 1400 in room IC 2.  Pre-procedure instructions given. Nothing to eat after 0600 and clear liquids until 1200.  Pt voiced understanding.  LDRN

## 2017-01-07 ENCOUNTER — Encounter: Admit: 2017-01-07 | Discharge: 2017-01-07 | Payer: MEDICARE

## 2017-01-07 ENCOUNTER — Ambulatory Visit: Admit: 2017-01-07 | Discharge: 2017-01-07 | Payer: MEDICARE

## 2017-01-07 DIAGNOSIS — Z9009 Acquired absence of other part of head and neck: ICD-10-CM

## 2017-01-07 DIAGNOSIS — Z4901 Encounter for fitting and adjustment of extracorporeal dialysis catheter: ICD-10-CM

## 2017-01-07 DIAGNOSIS — I2699 Other pulmonary embolism without acute cor pulmonale: ICD-10-CM

## 2017-01-07 DIAGNOSIS — I120 Hypertensive chronic kidney disease with stage 5 chronic kidney disease or end stage renal disease: Principal | ICD-10-CM

## 2017-01-07 DIAGNOSIS — G4730 Sleep apnea, unspecified: ICD-10-CM

## 2017-01-07 DIAGNOSIS — I10 Essential (primary) hypertension: ICD-10-CM

## 2017-01-07 DIAGNOSIS — N19 Unspecified kidney failure: Principal | ICD-10-CM

## 2017-01-07 DIAGNOSIS — K279 Peptic ulcer, site unspecified, unspecified as acute or chronic, without hemorrhage or perforation: ICD-10-CM

## 2017-01-07 DIAGNOSIS — F329 Major depressive disorder, single episode, unspecified: ICD-10-CM

## 2017-01-07 DIAGNOSIS — K611 Rectal abscess: ICD-10-CM

## 2017-01-07 DIAGNOSIS — F5089 Other specified eating disorder: ICD-10-CM

## 2017-01-07 DIAGNOSIS — N186 End stage renal disease: Secondary | ICD-10-CM

## 2017-01-07 MED ORDER — MIDAZOLAM 1 MG/ML IJ SOLN
0 refills | Status: CP
Start: 2017-01-07 — End: ?
  Administered 2017-01-07: 21:00:00 1 mg via INTRAVENOUS

## 2017-01-07 MED ORDER — HEPARIN (PORCINE) 1,000 UNIT/ML IJ SOLN
10 mL | Freq: Once | 0 refills | Status: DC
Start: 2017-01-07 — End: 2017-01-07

## 2017-01-07 MED ORDER — MIDAZOLAM 1 MG/ML IJ SOLN
1 mg | Freq: Once | INTRAVENOUS | 0 refills | Status: CP
Start: 2017-01-07 — End: ?
  Administered 2017-01-07: 20:00:00 1 mg via INTRAVENOUS

## 2017-01-07 MED ORDER — FENTANYL CITRATE (PF) 50 MCG/ML IJ SOLN
0 refills | Status: CP
Start: 2017-01-07 — End: ?
  Administered 2017-01-07 (×2): 25 ug via INTRAVENOUS

## 2017-01-07 MED ORDER — FENTANYL CITRATE (PF) 50 MCG/ML IJ SOLN
25-50 ug | Freq: Once | INTRAVENOUS | 0 refills | Status: CP
Start: 2017-01-07 — End: ?
  Administered 2017-01-07: 20:00:00 50 ug via INTRAVENOUS

## 2017-01-07 MED ORDER — ANTICOAG SODIUM CITRATE SOLN 5ML
5.8 mL | Freq: Once | 0 refills | Status: CP
Start: 2017-01-07 — End: ?
  Administered 2017-01-07: 21:00:00 5.8 mL

## 2017-01-07 NOTE — Progress Notes
Notified Dr. Loma MessingHund prior to sedating pt or wheeling back to procedure room that pt has a hx of obstructive sleep apnea and does not have CPAP because it is still in [place]. Okay per Dr. Loma MessingHund to proceed without.

## 2017-01-07 NOTE — Other
Immediate Post Procedure Note    Date:  01/07/2017                                         Attending Physician:   Robbie LisAdam Alli, MD  Performing Provider:  Claudina LickSamuel Tracey Stewart, MD    Consent:  Consent obtained from patient.  Time out performed: Consent obtained, correct patient verified, correct procedure verified, correct site verified, patient marked as necessary.  Pre/Post Procedure Diagnosis:  Renal Failure  Indications:  Need for HD    Anesthesia: Local 10 mL 1% lidocaine with epinephrine with IV sedation Fentanyl and Versed  Procedure(s):  hemodialysis catheter placement, tunneled, right femoral   Findings:  Patent right femoral vein via ultrasound. Catheter tip within subhepatic IVC. Please see separately dictated radiology report for complete details of procedure.      Estimated Blood Loss:  Minimal  Specimen(s) Removed/Disposition:  None  Complications: None  Patient Tolerated Procedure: Well  Post-Procedure Condition:  stable    Claudina LickSamuel Mohini Heathcock, MD  Pager [phone]

## 2017-01-07 NOTE — H&P (View-Only)
IR Pre Procedure History and Physical/Sedation Plan    Name:Debra Lam                                                                   MRN: [id number]                 DOB:Mar 13, 1995          Age: 21 y.o.  Admission Date: 01/07/2017             Days Admitted: LOS: 0 days      Procedure Date: 01/07/2017     Planned Procedure(s):  Tunneled HD line placement  Indication:  HD    ________________________________________________________________________    Chief Complaint:  Need for hemodialysis    History of Present Illness: Debra Lam is a 21 y.o. female.  Presents today for procedure.    Active Problems:    * No active hospital problems. *    Past Medical History:   Diagnosis Date   ??? H/O parathyroidectomy    ??? Hypertension    ??? Morbid obesity (HCC)    ??? Perirectal abscess    ??? PUD (peptic ulcer disease)    ??? Pulmonary embolism (HCC)    ??? Renal failure    ??? Sleep apnea       Past Surgical History:   Procedure Laterality Date   ??? KIDNEY TRANSPLANT  2009   ??? CENTRAL VENOUS CATHETER      hemodialysis catheter placement   ??? TUNNELED VENOUS PORT PLACEMENT          Medications:  For Outpatients and Same Day Surgery   No current facility-administered medications for this encounter.        Allergies:  Advair diskus [fluticasone-salmeterol]; Adhesive tape (rosins); and Coumadin [warfarin]    Social History     Social History   ??? Marital status: Single     Spouse name: N/A   ??? Number of children: 0   ??? Years of education: 60     Occupational History   ???       unemployed     Social History Main Topics   ??? Smoking status: Never Smoker   ??? Smokeless tobacco: Never Used   ??? Alcohol use No   ??? Drug use: No   ??? Sexual activity: Yes     Other Topics Concern   ??? Blood Transfusions Yes   ??? Exercise No     Social History Narrative   ??? No narrative on file     Family history reviewed; non-contributory    Review of Systems:  All other systems reviewed and are negative. Previous Anesthetic/Sedation History:  Yes   no adverse events      Physical Exam:                       Vital Signs: Last Filed                 Vital Signs: 24 Hour Range           There were no vitals filed for this visit.      General appearance: alert, cooperative and no distress  Neurologic: Grossly normal  Lungs: Equal chest rise bilaterally, no  audible stridor or wheezing  Heart: normal apical impulse, distal pulses in tact with regular rate and rhythm, no audible murmur  Abdomen: soft, non-tender. No masses,  no organomegaly  Extremities: extremities normal, atraumatic, no cyanosis or edema      Airway:  airway assessment performed  Mallampati II (soft palate, uvula, fauces visible)  Anesthesia Classification:  ASA II (A normal patient with mild systemic disease)  NPO Status: Acceptable  Pregnancy Status: Not Pregnant  Sedation/Medication Plan:  Fentanyl and Midazolam  Discussion/Reviews: Physician has discussed risks and alternatives of this type of sedation and above planned procedures with patient    Lab/Other Diagnostic Tests:  Labs: 24-hour labs:  No results found for this visit on 01/07/17 (from the past 24 hour(s)).    Notes:  none    Claudina Lick, MD  Pager [phone]

## 2017-01-07 NOTE — Progress Notes
Sedation physician present in room. Recent vitals and patient condition reviewed between sedating physician and nurse. Reassessment completed. Determination made to proceed with planned sedation.

## 2017-01-09 ENCOUNTER — Encounter: Admit: 2017-01-09 | Discharge: 2017-01-10 | Payer: MEDICARE

## 2017-01-12 ENCOUNTER — Encounter: Admit: 2017-01-12 | Discharge: 2017-01-12 | Payer: MEDICARE

## 2017-01-12 DIAGNOSIS — Z7901 Long term (current) use of anticoagulants: ICD-10-CM

## 2017-01-12 DIAGNOSIS — R578 Other shock: ICD-10-CM

## 2017-01-12 MED ORDER — CARVEDILOL 25 MG PO TAB
25 mg | Freq: Once | ORAL | 0 refills | Status: CP
Start: 2017-01-12 — End: ?
  Administered 2017-01-13: 06:00:00 25 mg via ORAL

## 2017-01-12 MED ORDER — FENTANYL CITRATE (PF) 50 MCG/ML IJ SOLN
25 ug | Freq: Once | INTRAVENOUS | 0 refills | Status: CP
Start: 2017-01-12 — End: ?
  Administered 2017-01-13: 06:00:00 25 ug via INTRAVENOUS

## 2017-01-12 MED ORDER — PIPERACILLIN-TAZOBACTAM-DEXTRS 2.25 GRAM/50 ML IV PGBK
2.25 g | INTRAVENOUS | 0 refills | Status: DC
Start: 2017-01-12 — End: 2017-01-17
  Administered 2017-01-13 – 2017-01-17 (×12): 2.25 g via INTRAVENOUS

## 2017-01-12 MED ORDER — PIPERACILLIN-TAZOBACTAM-DEXTRS 3.375 GRAM/50 ML IV PGBK
3.375 g | INTRAVENOUS | 0 refills | Status: DC
Start: 2017-01-12 — End: 2017-01-13

## 2017-01-12 MED ORDER — SODIUM CHLORIDE 0.9 % IV SOLP
300 mL | INTRAVENOUS | 0 refills | Status: CP | PRN
Start: 2017-01-12 — End: ?
  Administered 2017-01-13: 06:00:00 300 mL via INTRAVENOUS

## 2017-01-12 MED ORDER — HEPARIN, PORCINE (PF) 5,000 UNIT/0.5 ML IJ SYRG
5000 [IU] | SUBCUTANEOUS | 0 refills | Status: DC
Start: 2017-01-12 — End: 2017-01-14
  Administered 2017-01-13 – 2017-01-14 (×4): 5000 [IU] via SUBCUTANEOUS

## 2017-01-12 MED ORDER — VANCOMYCIN RANDOM DOSING
1 | INTRAVENOUS | 0 refills | Status: DC
Start: 2017-01-12 — End: 2017-01-13

## 2017-01-12 MED ORDER — VANCOMYCIN 1,750 MG IVPB
15 mg/kg | Freq: Two times a day (BID) | INTRAVENOUS | 0 refills | Status: DC
Start: 2017-01-12 — End: 2017-01-13

## 2017-01-12 MED ORDER — CETIRIZINE 10 MG PO TAB
5 mg | Freq: Every morning | ORAL | 0 refills | Status: DC
Start: 2017-01-12 — End: 2017-01-29
  Administered 2017-01-13 – 2017-01-28 (×15): 5 mg via ORAL

## 2017-01-12 MED ORDER — AMLODIPINE 10 MG PO TAB
10 mg | Freq: Every day | ORAL | 0 refills | Status: DC
Start: 2017-01-12 — End: 2017-01-14
  Administered 2017-01-13 – 2017-01-14 (×2): 10 mg via ORAL

## 2017-01-12 MED ORDER — SODIUM CHLORIDE 0.9 % IV SOLP
250 mL | INTRAVENOUS | 0 refills | Status: DC | PRN
Start: 2017-01-12 — End: 2017-01-13

## 2017-01-12 MED ORDER — VANCOMYCIN PHARMACY TO MANAGE
1 | 0 refills | Status: DC
Start: 2017-01-12 — End: 2017-01-13

## 2017-01-12 MED ORDER — CARVEDILOL 25 MG PO TAB
25 mg | Freq: Two times a day (BID) | ORAL | 0 refills | Status: DC
Start: 2017-01-12 — End: 2017-01-14
  Administered 2017-01-13 – 2017-01-14 (×3): 25 mg via ORAL

## 2017-01-12 MED ORDER — ANTICOAG SODIUM CITRATE SOLN 5ML
1-5 mL | Freq: Once | 0 refills | Status: CP
Start: 2017-01-12 — End: ?

## 2017-01-12 MED ORDER — SODIUM CHLORIDE 0.9 % IV SOLP
1000 mL | INTRAVENOUS | 0 refills | Status: DC | PRN
Start: 2017-01-12 — End: 2017-01-13

## 2017-01-13 ENCOUNTER — Inpatient Hospital Stay
Admit: 2017-01-13 | Discharge: 2017-01-28 | Disposition: A | Discharge status: Home / Self Care | Payer: MEDICARE | Source: Other Acute Inpatient Hospital

## 2017-01-13 ENCOUNTER — Inpatient Hospital Stay: Admit: 2017-01-13 | Discharge: 2017-01-13 | Payer: MEDICARE

## 2017-01-13 ENCOUNTER — Encounter: Admit: 2017-01-13 | Discharge: 2017-01-13 | Payer: MEDICARE

## 2017-01-13 LAB — PHOSPHORUS
Lab: 5.4 mg/dL — ABNORMAL HIGH (ref 2.0–4.5)
Lab: 2.5 mg/dL — ABNORMAL HIGH (ref >60)

## 2017-01-13 LAB — BETA-HCG: Lab: 1 U/L (ref <5)

## 2017-01-13 LAB — COMPREHENSIVE METABOLIC PANEL
Lab: 115 U/L — ABNORMAL HIGH (ref 25–110)
Lab: 12 U/L (ref 7–40)
Lab: 13 10*3/uL — ABNORMAL HIGH (ref 3–12)
Lab: 131 MMOL/L — ABNORMAL LOW (ref 137–147)
Lab: 22 MMOL/L — ABNORMAL HIGH (ref 21–30)
Lab: 3 mL/min — ABNORMAL LOW (ref >60)
Lab: 4 mL/min — ABNORMAL LOW (ref >60)
Lab: 5.9 MMOL/L — ABNORMAL HIGH (ref 3.5–5.1)
Lab: 7 U/L — ABNORMAL LOW (ref 7–56)
Lab: 134 MMOL/L — ABNORMAL LOW (ref >60)

## 2017-01-13 LAB — THYROID STIMULATING HORMONE-TSH: Lab: 0.3 uU/mL (ref 0.35–5.00)

## 2017-01-13 LAB — BLOOD GASES, CENTRAL VENOUS
Lab: 27 MMOL/L
Lab: 3.3 MMOL/L
Lab: 36 mmHg — ABNORMAL LOW (ref 40–50)
Lab: 44 mmHg (ref >40)
Lab: 65 % (ref 65–75)
Lab: 7.4 — ABNORMAL HIGH (ref 7.30–7.40)

## 2017-01-13 LAB — PTT (APTT): Lab: 25 s — ABNORMAL LOW (ref 20.0–36.0)

## 2017-01-13 LAB — VANCOMYCIN TROUGH: Lab: 28 ug/mL — ABNORMAL HIGH (ref 10.0–20.0)

## 2017-01-13 LAB — RVP VIRAL PANEL PCR: Lab: DETECTED

## 2017-01-13 LAB — MAGNESIUM
Lab: 2.1 mg/dL — ABNORMAL LOW (ref 1.6–2.6)
Lab: 1.8 mg/dL — ABNORMAL LOW (ref 1.6–2.6)

## 2017-01-13 LAB — LACTIC ACID (BG - RAPID LACTATE)
Lab: 1 MMOL/L (ref 0.5–2.0)
Lab: 0.8 MMOL/L (ref 0.5–2.0)
Lab: 0.8 MMOL/L (ref 0.5–2.0)

## 2017-01-13 LAB — CREATINE KINASE-CPK: Lab: 150 U/L (ref 21–215)

## 2017-01-13 LAB — PROTIME INR (PT): Lab: 1.1 M/UL — ABNORMAL LOW (ref 0.8–1.2)

## 2017-01-13 LAB — SED RATE: Lab: 44 mm/h — ABNORMAL HIGH (ref 0–20)

## 2017-01-13 LAB — LIPASE: Lab: 50 U/L (ref 11–82)

## 2017-01-13 LAB — CBC AND DIFF
Lab: 15 10*3/uL — ABNORMAL HIGH (ref 4.5–11.0)
Lab: 14 K/UL — ABNORMAL HIGH (ref 4.5–11.0)

## 2017-01-13 LAB — BNP (B-TYPE NATRIURETIC PEPTI): Lab: 49 pg/mL (ref 0–100)

## 2017-01-13 MED ORDER — FENTANYL CITRATE (PF) 50 MCG/ML IJ SOLN
25 ug | INTRAVENOUS | 0 refills | Status: DC | PRN
Start: 2017-01-13 — End: 2017-01-13
  Administered 2017-01-13 (×5): 25 ug via INTRAVENOUS

## 2017-01-13 MED ORDER — ACETAMINOPHEN 325 MG PO TAB
650 mg | ORAL | 0 refills | Status: DC | PRN
Start: 2017-01-13 — End: 2017-01-15
  Administered 2017-01-13 – 2017-01-15 (×6): 650 mg via ORAL

## 2017-01-13 MED ORDER — ONDANSETRON HCL (PF) 4 MG/2 ML IJ SOLN
4 mg | INTRAVENOUS | 0 refills | Status: DC | PRN
Start: 2017-01-13 — End: 2017-01-29
  Administered 2017-01-15 – 2017-01-27 (×21): 4 mg via INTRAVENOUS

## 2017-01-13 MED ORDER — LABETALOL 5 MG/ML IV SYRG
10 mg | Freq: Once | INTRAVENOUS | 0 refills | Status: DC
Start: 2017-01-13 — End: 2017-01-13

## 2017-01-13 MED ORDER — HYDROCODONE-ACETAMINOPHEN 5-325 MG PO TAB
1 | ORAL | 0 refills | Status: DC | PRN
Start: 2017-01-13 — End: 2017-01-14
  Administered 2017-01-13 – 2017-01-14 (×2): 1 via ORAL

## 2017-01-13 MED ORDER — LIDOCAINE 5 % TP PTMD
1 | Freq: Every day | TOPICAL | 0 refills | Status: DC
Start: 2017-01-13 — End: 2017-01-29
  Administered 2017-01-13 – 2017-01-24 (×9): 1 via TOPICAL

## 2017-01-13 MED ORDER — EMU OIL 120ML
TOPICAL | 0 refills | Status: DC | PRN
Start: 2017-01-13 — End: 2017-01-29
  Administered 2017-01-13 – 2017-01-15 (×2): 120.000 mL via TOPICAL

## 2017-01-13 MED ORDER — LINEZOLID IN DEXTROSE 5% 600 MG/300 ML IV PGBK
600 mg | Freq: Two times a day (BID) | INTRAVENOUS | 0 refills | Status: DC
Start: 2017-01-13 — End: 2017-01-17
  Administered 2017-01-14 – 2017-01-17 (×8): 600 mg via INTRAVENOUS

## 2017-01-13 MED ADMIN — LABETALOL 5 MG/ML IV SYRG [86579]: INTRAVENOUS | @ 08:00:00 | Stop: 2017-01-13

## 2017-01-13 MED ADMIN — ANTICOAG SODIUM CITRATE SOLN 5ML [210585]: 5 mL | @ 10:00:00 | Stop: 2017-01-13 | NDC 54029018302

## 2017-01-13 NOTE — Case Management (ED)
Case Management Admission Assessment    NAME:Debra Lam                          MRN: [id number]             DOB:05/30/1995          AGE: 21 y.o.  ADMISSION DATE: 01/12/2017             DAYS ADMITTED: LOS: 1 day      Today???s Date: 01/13/2017    Source of Information: Patient, Brother, Sister-in-law    Patient deferred assessment to brother at bedside. Brother deferred assessment to Sister-in-law via telephone; pt consented to [facility] completing assessment with sister-in-law via telephone.     12/05- Admitted from OSH w/worsening renal failure and right thigh pain.       Plan  Plan: Case Management Assessment, Assist PRN with SW/NCM Services, Discharge Planning for Home Anticipated d/t adequate support system in place prior to admission w/o significant changes in the interim.    Introduced self and CM role. Run sheets will need to be sent to pt's OP HD clinic upon d/c. [facility] contacted regarding pt's [place] Medicaid status.     Patient Address/Phone  [address]  [place] [address]  [phone] (home)     Emergency Contact  Extended Emergency Contact Information  Primary Emergency Contact: [employer]  Home Phone: [phone]  Mobile Phone: [phone]  Relation: Brother    Healthcare Directive     None    Transportation  Does the patient need discharge transport arranged?: No  Transportation Name, Phone and Availability #1: Johny Drilling (brother) - [phone] or Asher Muir (sister-in-law) [phone]    Expected Discharge Date       Living Situation Prior to Admission  ? Living Arrangements  Type of Residence: Home, independent  Living Arrangements: Family members(brother and sister-in-law)  How many levels in the residence?: 1  Does residence have entry and/or side stairs?: Yes  Assistance needed prior to admit or anticipated on discharge: Yes(intermittant when sick)  Who provides assistance or could if needed?: brother and sister-in-law  Are they in good health?: Unknown ? Level of Function   Prior level of function: Independent  ? Cognitive Abilities   Cognitive Abilities: Alert and Oriented    Financial Resources  ? Coverage  Primary Insurance: Medicare(Part A and B)  Secondary Insurance: Medicaid  Additional Coverage: RX(no difficulties w/affordability)    ? Source of Income   Source Of Income: SSDI  ? Financial Assistance Needed?  [facility] e-mailed [facility] as pt currently has a lapse in [place] Medicaid d/t clerical confusion regarding her address being in [place] or [place]. Pt moved to [place] from [place] in April 2018; Medicaid was transferred from [place] to [place] without issue until very recently.     Psychosocial Needs -Unable to assess d/t completing assessment w/family.   ? Mental Health  Mental Health History: (unable to assess)  ? Substance Use History  Substance Use History Screen: (unable to assess)  ? Other  Unable to assess.     Current/Previous Services  ? PCP  Wilford Grist, MD, [phone], [phone]  ? Pharmacy    [facility] [phone] - [place] [place] - [address]  [address]  [place] [address]  Phone: [phone] Fax: [phone]    [facility] - [place], [place] - [address]  [address]  [place]  [address]  Phone: [phone] Fax: [phone]    ? Durable Medical Equipment   Durable Medical Equipment at home: None  ? Home Health  Receiving home health: No  ? Hemodialysis or Peritoneal Dialysis  Undergoing hemodialysis or peritoneal dialysis: Yes  Hemodialysis or Peritoneal Dialysis: Hemodialysis  Location: [facility]  Days attending: Tuesday, Thursday, Saturday  Transportation to treatment via: Family  ? Tube/Enteral Feeds  Receive tube/enteral feeds: No  ? Infusion  Receive infusions: No  ? Private Duty  Private duty help used: No  ? Home and Community Based Services  Home and community based services: No  ? [employer]: N/A  ? Hospice  Hospice: No  ? Outpatient Therapy  PT: No  OT: No  SLP: No  ? Skilled Nursing Facility/Nursing Home  SNF: No NH: No  ? Inpatient Rehab  IPR: No  ? Long-Term Acute Care Hospital  LTACH: No  ? Acute Hospital Stay  Acute Hospital Stay: No      Gorden Harms, BSN, RN, CCRN, SCRN, CNRN  Integrated Nurse Case Manager  Pager: [phone]

## 2017-01-13 NOTE — Progress Notes
21 yo female pt presents to OSH ED with pain in her rt thigh.  Pt had a dialysis catheter placed on 11/30 here at [facility].  The day after placement she presented to the ED with pain but was treated and released.  Pt was dialyzed using the catheter yesterday for the first time.  Today she presents with severe thigh pain.  Redness and bruising noted around site.  Distal pulses present.  MD states that pt has an elevated potassium and creatinine with a fever even after dialysis.  Pt alert and oriented x 3.  VSS. Pain is better after pain meds.

## 2017-01-13 NOTE — Progress Notes
MICU STAFF NOTE      I have seen, examined, and personally fully evaluated the patient, who is critically ill with the conditions listed below:    Active Problems:    ESRD (end stage renal disease) on dialysis (HCC)    Morbid obesity (HCC)    Sepsis (HCC)    Hyperkalemia      I spent 40 minutes (excluding time spent performing or supervising any procedures) providing and personally directing critical care services including:     Systems review and physical examination   Serial evaluation and management of hemodynamics   Serial evaluation and management of respiratory status   Review and management of ICU prophylaxis and core measures   Review of laboratory data   Review of telemetry data   Review of imaging studies   Review of medications   Fluid and electrolyte management    Patient is critically ill with acute hypoxemic respiratory failure, severe sepsis, ESRD requiring emergent HD on 12/5, hx of DDRT s/p rejection and + RVP for parainfluenza. Patient reporting severe right HD thigh pain around catheter site. CT RLE and pelvis with no acute process. Continue sepsis workup. Continue broad spectrum abx. Patient currently requiring continuous NIPPV for dyspnea. Continue broncho-dilators. B-HCG negative.  Obtain lower extremity dopplers.  Patient with a hx of non-compliance with medications and CPAP per family. Continue supportive and ICU care.     Staff name:  Dionne AnoUsman Jovian Lembcke, MD Date:  01/13/2017

## 2017-01-13 NOTE — Progress Notes
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 Placed right femoral tunneled HD catheter in IR on 11/30. Admitted with right thigh pain and sepsis. Found to have parainfluenza virus.    Right Thigh Pain  - Tunneled HD catheter placed 11/30  - No erythema or discharge on exam  - Right groin moist and malodorous on exam  - HD catheter functional   - Current pain control with APAP, Norco and lidoderm patch  - Likely post surgical changes about tunneled HD on recent CT and [place]    Recommendations:  - Keep area (right groin) clean and dry   - Apply nystatin powder to groin as needed   - Pain control per primary team  - No need for catheter removal at this time      We appreciate taking part in Debra Lam' care. Please do not hesitate to contact our team with any further questions or concerns. Discussed above findings with Dr. Felipa Furnace who agrees with assessment and plan.    Debra Lick, MD  Pager [phone]      ________________________________________________________________________    Subjective:    Post-Procedural Day:  6  Debra Lam is a 21 y.o. female.  Overnight Events:Right thigh pain. Leukocytosis. Fever .      Objective:  Medications:  Scheduled Meds:  amLODIPine (NORVASC) tablet 10 mg 10 mg Oral QDAY carvedilol (COREG) tablet 25 mg 25 mg Oral BID w/meals   cetirizine (ZYRTEC) tablet 5 mg 5 mg Oral QAM8   heparin (porcine) PF syringe 5,000 Units 5,000 Units Subcutaneous Q8H   lidocaine (LIDODERM) 5 % topical patch 1 patch 1 patch Topical QDAY   piperacillin/tazobactam  (ZOSYN) 2.25 g/50 mL iso-osmotic IVPB 2.25 g Intravenous Q8H*   vancomycin, random dosing 1 each Intravenous Random Dosing   Continuous Infusions:  PRN and Respiratory Meds:acetaminophen Q4H PRN, emu PRN, fentaNYL citrate PF Q1H PRN, HYDROcodone/acetaminophen Q6H PRN, ondansetron (ZOFRAN) IV Q6H PRN, vancomycin, pharmacy to manage Per Pharmacy                      Vital Signs: Last Filed                Vital Signs: 24 Hour Range   BP: 141/81 (12/06 1700)  Temp: 37.9 ???C (100.3 ???F) (12/06 1600)  Pulse: 107 (12/06 1700)  Respirations: 30 PER MINUTE (12/06 1700)  SpO2: 100 % (  12/06 1700)  O2 Delivery: Non Invasive Ventilation (CPAP) (12/06 1000)  SpO2 Pulse: 107 (12/06 1700)  Height: 162.6 cm (64) (12/05 2235)  BP: (76-218)/(32-138)   Temp:  [37.2 ???C (99 ???F)-39.6 ???C (103.2 ???F)]   Pulse:  [93-137]   Respirations:  [0 PER MINUTE-45 PER MINUTE]   SpO2:  [73 %-100 %]   O2 Delivery: Non Invasive Ventilation (CPAP)   Intensity Pain Scale (Self Report): (not recorded)                   Physical Exam  General appearance: alert  Neurologic: Grossly normal  Extremities: Tunneled HD in right upper thigh. Access site in right groin. Right inguinal crease moist and malodorous. No skin erythyma or discharge from surgical sites or around drain.    Lab Review    Results for orders placed or performed during the hospital encounter of 01/12/17 (from the past 24 hour(s))   CREATINE KINASE-CPK    Collection Time: 01/12/17 11:10 PM   Result Value Ref Range    Creatine Kinase 150 21 - 215 U/L   CBC AND DIFF    Collection Time: 01/12/17 11:10 PM   Result Value Ref Range    White Blood Cells 15.8 (H) 4.5 - 11.0 K/UL    RBC 2.80 (L) 4.0 - 5.0 M/UL Hemoglobin 8.2 (L) 12.0 - 15.0 GM/DL    Hematocrit 16.1 (L) 36 - 45 %    MCV 89.9 80 - 100 FL    MCH 29.3 26 - 34 PG    MCHC 32.6 32.0 - 36.0 G/DL    RDW 09.6 (H) 11 - 15 %    Platelet Count 175 150 - 400 K/UL    MPV 7.8 7 - 11 FL    Neutrophils 88 (H) 41 - 77 %    Lymphocytes 5 (L) 24 - 44 %    Monocytes 7 4 - 12 %    Eosinophils 0 0 - 5 %    Basophils 0 0 - 2 %    Absolute Neutrophil Count 13.90 (H) 1.8 - 7.0 K/UL    Absolute Lymph Count 0.80 (L) 1.0 - 4.8 K/UL    Absolute Monocyte Count 1.10 (H) 0 - 0.80 K/UL    Absolute Eosinophil Count 0.00 0 - 0.45 K/UL    Absolute Basophil Count 0.00 0 - 0.20 K/UL   PROTIME INR (PT)    Collection Time: 01/12/17 11:10 PM   Result Value Ref Range    INR 1.1 0.8 - 1.2   PTT (APTT)    Collection Time: 01/12/17 11:10 PM   Result Value Ref Range    APTT 25.3 20.0 - 36.0 SEC   COMPREHENSIVE METABOLIC PANEL    Collection Time: 01/12/17 11:10 PM   Result Value Ref Range    Sodium 131 (L) 137 - 147 MMOL/L    Potassium 5.9 (H) 3.5 - 5.1 MMOL/L    Chloride 96 (L) 98 - 110 MMOL/L    Glucose 99 70 - 100 MG/DL    Blood Urea Nitrogen 58 (H) 7 - 25 MG/DL    Creatinine 04.54 (H) 0.4 - 1.00 MG/DL    Calcium 8.0 (L) 8.5 - 10.6 MG/DL    Total Protein 8.1 (H) 6.0 - 8.0 G/DL    Total Bilirubin 0.4 0.3 - 1.2 MG/DL    Albumin 3.7 3.5 - 5.0 G/DL    Alk Phosphatase 098 (H) 25 - 110 U/L    AST (SGOT) 12 7 - 40  U/L    CO2 22 21 - 30 MMOL/L    ALT (SGPT) 7 7 - 56 U/L    Anion Gap 13 (H) 3 - 12    eGFR Non African American 3 (L) >60 mL/min    eGFR African American 4 (L) >60 mL/min   MAGNESIUM    Collection Time: 01/12/17 11:10 PM   Result Value Ref Range    Magnesium 2.1 1.6 - 2.6 mg/dL   PHOSPHORUS    Collection Time: 01/12/17 11:10 PM   Result Value Ref Range    Phosphorus 5.4 (H) 2.0 - 4.5 MG/DL   VANCOMYCIN TROUGH    Collection Time: 01/12/17 11:10 PM   Result Value Ref Range    Vancomycin Trough 28.0 (H) 10.0 - 20.0 MCG/ML   LACTIC ACID (BG - RAPID LACTATE)    Collection Time: 01/12/17 11:10 PM Result Value Ref Range    Lactic Acid,BG 1.0 0.5 - 2.0 MMOL/L   THYROID STIMULATING HORMONE-TSH    Collection Time: 01/12/17 11:10 PM   Result Value Ref Range    TSH 0.380 0.35 - 5.00 MCU/ML   BLOOD BANK SAMPLE HOLD    Collection Time: 01/12/17 11:10 PM   Result Value Ref Range    BB Sample hold IN LAB    LACTIC ACID (BG - RAPID LACTATE)    Collection Time: 01/13/17  3:25 AM   Result Value Ref Range    Lactic Acid,BG 0.8 0.5 - 2.0 MMOL/L   CBC AND DIFF    Collection Time: 01/13/17  3:27 AM   Result Value Ref Range    White Blood Cells 14.7 (H) 4.5 - 11.0 K/UL    RBC 2.97 (L) 4.0 - 5.0 M/UL    Hemoglobin 8.6 (L) 12.0 - 15.0 GM/DL    Hematocrit 81.1 (L) 36 - 45 %    MCV 89.1 80 - 100 FL    MCH 29.1 26 - 34 PG    MCHC 32.7 32.0 - 36.0 G/DL    RDW 91.4 (H) 11 - 15 %    Platelet Count 172 150 - 400 K/UL    MPV 7.8 7 - 11 FL    Neutrophils 88 (H) 41 - 77 %    Lymphocytes 5 (L) 24 - 44 %    Monocytes 7 4 - 12 %    Eosinophils 0 0 - 5 %    Basophils 0 0 - 2 %    Absolute Neutrophil Count 13.00 (H) 1.8 - 7.0 K/UL    Absolute Lymph Count 0.70 (L) 1.0 - 4.8 K/UL    Absolute Monocyte Count 1.00 (H) 0 - 0.80 K/UL    Absolute Eosinophil Count 0.00 0 - 0.45 K/UL    Absolute Basophil Count 0.00 0 - 0.20 K/UL   COMPREHENSIVE METABOLIC PANEL    Collection Time: 01/13/17  3:27 AM   Result Value Ref Range    Sodium 134 (L) 137 - 147 MMOL/L    Potassium 3.4 (L) 3.5 - 5.1 MMOL/L    Chloride 95 (L) 98 - 110 MMOL/L    Glucose 98 70 - 100 MG/DL    Blood Urea Nitrogen 19 7 - 25 MG/DL    Creatinine 7.82 (H) 0.4 - 1.00 MG/DL    Calcium 8.8 8.5 - 95.6 MG/DL    Total Protein 8.7 (H) 6.0 - 8.0 G/DL    Total Bilirubin 0.6 0.3 - 1.2 MG/DL    Albumin 3.8 3.5 - 5.0 G/DL    Alk Phosphatase  120 (H) 25 - 110 U/L    AST (SGOT) 14 7 - 40 U/L    CO2 27 21 - 30 MMOL/L    ALT (SGPT) 10 7 - 56 U/L    Anion Gap 12 3 - 12    eGFR Non African American 10 (L) >60 mL/min    eGFR African American 13 (L) >60 mL/min   MAGNESIUM    Collection Time: 01/13/17  3:27 AM Result Value Ref Range    Magnesium 1.8 1.6 - 2.6 mg/dL   PHOSPHORUS    Collection Time: 01/13/17  3:27 AM   Result Value Ref Range    Phosphorus 2.5 2.0 - 4.5 MG/DL   BETA-HCG    Collection Time: 01/13/17  3:27 AM   Result Value Ref Range    Beta-HCG,Serum 1 <5 U/L   LIPASE    Collection Time: 01/13/17  3:27 AM   Result Value Ref Range    Lipase 50 11 - 82 U/L   RVP VIRAL PANEL PCR    Collection Time: 01/13/17  8:43 AM   Result Value Ref Range    Specimen Source       NASOPHARYNGEAL SWAB  This assay uses analyte specific reagents and has not been cleared by the [place]   Food and Drug Administration.  The performance characteristics were determined   by the [facility].      Adenovirus NOT DETECTED     Coronavirus 229E NOT DETECTED     Coronavirus HKU1 NOT DETECTED     Coronavirus NL63 NOT DETECTED     Coronavirus OC43 NOT DETECTED     Human Metapneumovirus NOT DETECTED     Human Rhinovirus/ENTEROVIRUS NOT DETECTED     Influenza A H1N1 2009 NOT DETECTED     Influenza A H1 NOT DETECTED     Influenza A H3 NOT DETECTED     Influenza B NOT DETECTED     Parainfluenza 1 NOT DETECTED     Parainfluenza 2 NOT DETECTED     Parainfluenza 3 NOT DETECTED     Parainfluenza 4 DETECTED     RSV NOT DETECTED     Bordetella Pertussis NOT DETECTED     Chlamydophila Pneumoniae NOT DETECTED     Mycoplasma Pneumoniae NOT DETECTED    LACTIC ACID (BG - RAPID LACTATE)    Collection Time: 01/13/17  9:17 AM   Result Value Ref Range    Lactic Acid,BG 0.8 0.5 - 2.0 MMOL/L   BLOOD GASES, CENTRAL VENOUS    Collection Time: 01/13/17  9:17 AM   Result Value Ref Range    PH-Central Venous 7.42 (H) 7.30 - 7.40    PCO2-Central Venous 44 >40 MMHG    PO2-Central Venous 36 (L) 40 - 50 MMHG    Base Excess-Central Venous 3.3 MMOL/L    O2 Sat (Calc)-Central Venous 65.2 65 - 75 %    Bicarb-Central Venous 27.0 MMOL/L

## 2017-01-13 NOTE — Other
Critical result or procedure called (document test and value, and read back):  Rt HD culture positive for gram + cocci resembling strep  Time MD/NP Notified:  1745  MD/NP Name:  APRN Bebermeyer  MD/NP Response/Orders Given:  Noted

## 2017-01-13 NOTE — H&P (View-Only)
Trauma/Critical Care    Admission History and Physical Assessment    Name:  Debra Lam                                                     MRN:  [id number]   Admission Date:  01/12/2017                     Assessment/Plan:    Active Problems:    Acute renal failure on dialysis [facility])      FEN: No IVF, on CRRT, replace lytes as needed, NPO for now  ID:    #Sepsis  -Febrile, elevated wBC, tachycardic at [facility]  -Started on Vanc and Zosyn prior to transport here.    -Suspect R HD cath as source.  Patient also has port  PLAN  -Blood cultures x2, lactic acid 1.0, CXR without infiltrate or effusion, UA with culture if producing urine     -Continue Vanc and Zosyn  -Concern for abscess at R femoral access.  Will order CT Pelvis and RLE to evaluate.     CNS:    Patient alert and oriented, answers questions appropriately.  No concerns at this time.    Cardiovascular:    #HTN  -Patient with BP in the 180s systolic  -PTA meds include Amlodipine, Coreg, and Fosinopril  PLAN  -Continue PTA Amlodipine and Coreg  -Hold ACEi for hyperkalemia    Respiratory:    #Obstructive Sleep Apnea  -Has CPAP at home but has not used for the past month as she has lost the power cord.     GI:    Denies N/V, ABD pain, constipation, diarrhea.  No concerns at this time.     GU:    #Hyperkalemia  -No EKG changes associated with hyperkalemia.   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  -On HD since 2010.  Patient had to switch to PD due to lack of venous access.  -Re-evaluated October 2018 for kidney transplant  -R Femoral tunneled HD cath placed by IR on 11/30.  -Last hemodialysis on 01/11/17, tolerated well.  -Presented to OLF on 12/5 for R thigh pain, found to be febrile, tachycardic, elevated WBC, elevated potassium and creatinine.  PLAN  -Needs urgent dialysis.  Dialysis nurses present at bedside to setup equipment.  -Nephro consulted, recommendations appreciated. -Patient received abx en route to [place] and will continue after dialysis.    Patient seen and discussed with Dr. Felipe Drone    Prophylaxis Review:  Lines:  Yes; [employer]; Indication:  Hemodialysis/Plasmapheresis; Type:  Femoral  Urinary Catheter:  No  Antibiotic Usage:  Yes; Infection present or suspected:  Femoral line infection  VTE:  Pharmacological prophylaxis; SQ Heparin    Disposition/Family:   Family present. Continue admission for CRRT  Code Status:  Full Code  __________________________________________________________________________________  Primary Care Physician: [facility]  Verified    Chief Complaint:  Acute renal failure  History of Present Illness:  Debra Lam is a 21 y.o. 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      Patient  had R tunneled hemodialysis catheter placed by IR here on 11/30.  Patient reporting the pain in her R thigh has been present ever since that time.  The catheter was first used for hemodialysis on 01/11/17 and patient states this was complicated as the line kept clotting off.  Patient presented to the ED in [facility] for continued thigh pain and was found to have elevated Cr with potassium in the 7s, febrile, tachycardic, with elevated WBCs.  Patient was started on Vanc and Zosyn for suspected line infection , received insulin for hyperkalemia, and transferred here for urgent dialysis.    Patient reports exquisite R thigh pain, chills, oliguria.  Denies SHOB, CP, ABD pain, N/V, numbness or tingling in extremities.    Past Medical History:   Diagnosis Date   ??? Depression    ??? H/O parathyroidectomy    ??? Hypertension    ??? Morbid obesity (HCC)    ??? Perirectal abscess    ??? Pica    ??? PUD (peptic ulcer disease)    ??? Pulmonary embolism (HCC)    ??? Renal failure ??? Sleep apnea      Past Surgical History:   Procedure Laterality Date   ??? KIDNEY TRANSPLANT  2009   ??? CENTRAL VENOUS CATHETER      hemodialysis catheter placement   ??? TUNNELED VENOUS PORT PLACEMENT       Family history reviewed; non-contributory  Social History     Social History   ??? Marital status: Single     Spouse name: N/A   ??? Number of children: 0   ??? Years of education: 66     Occupational History   ???       unemployed     Social History Main Topics   ??? Smoking status: Never Smoker   ??? Smokeless tobacco: Never Used   ??? Alcohol use No   ??? Drug use: No   ??? Sexual activity: Yes     Other Topics Concern   ??? Blood Transfusions Yes   ??? Exercise No     Social History Narrative   ??? No narrative on file     Immunizations (includes history and patient reported):   There is no immunization history on file for this patient.   Allergies:  Advair diskus [fluticasone-salmeterol]; Adhesive tape (rosins); and Coumadin [warfarin]    Medications:    Prescriptions Prior to Admission   Medication Sig   ??? amLODIPine (NORVASC) 10 mg tablet Take 10 mg by mouth daily.   ??? calcitriol (ROCALTROL) 0.5 mcg capsule Take 1 mcg by mouth daily.   ??? calcium acetate (PHOSLO) 667 mg capsule Take 1,334 mg by mouth three times daily with meals.   ??? carvedilol (COREG) 25 mg tablet Take 25 mg by mouth twice daily with meals. Take with food.   ??? cetirizine (ZYRTEC) 5 mg tablet Take 5 mg by mouth every morning.   ??? fosinopril (MONOPRIL) 40 mg tablet Take 40 mg by mouth daily.   ??? polyethylene glycol 3350 (MIRALAX) 17 g packet Take 17 g by mouth daily as needed.   ??? potassium chloride SR (K-DUR) 10 mEq tablet Take 10 mEq by mouth daily. Take with a meal and a full glass of water.     Review of Systems:  A 14 point review of systems was negative except for: as noted in HPI    Vital Signs: Last Filed In 24 Hours  Vital Signs: 24 Hour Range   BP: 171/109 (12/05 2235)  Temp: 37.2 ???C (99 ???F) (12/05 2235)  Pulse: 112 (12/05 2235) Respirations: 22 PER MINUTE (12/05 2235)  SpO2: 100 % (12/05 2235)  O2 Delivery: None (Room Air) (12/05 2235)  SpO2 Pulse: 113 (12/05 2235)  Height: 162.6 cm (64) (12/05 2235)  BP: (171)/(109)   Temp:  [37.2 ???C (99 ???F)]   Pulse:  [112]   Respirations:  [22 PER MINUTE]   SpO2:  [100 %]   O2 Delivery: None (Room Air)          Physical Exam:   General appearance:  alert, cooperative, mild distress and morbidly obese  Head:  Normocephalic, without obvious abnormality, atraumatic  Eyes:  conjunctivae/corneas clear. PERRL, EOM's intact. Fundi benign  Neck:  supple, symmetrical, trachea midline, no JVD and    Lungs:  clear to auscultation bilaterally  Heart:  regular rate and rhythm, S1, S2 normal, no murmur  Abdomen:  soft, non-tender. Bowel sounds normal. No masses,  no organomegaly  Extremities:  extremities normal, atraumatic, no cyanosis or edema.  R thigh tender to palpation  Neurologic:  Grossly normal  Peripheral pulses: DP 2+ and symmetric  Skin:  Skin color, texture, turgor normal. No rashes or lesions  Psych:   Anxious  Ventilator/ Respiratory Support:  No     Lab:  24-hour labs:    Results for orders placed or performed during the hospital encounter of 01/12/17 (from the past 24 hour(s))   CREATINE KINASE-CPK    Collection Time: 01/12/17 11:10 PM   Result Value Ref Range    Creatine Kinase 150 21 - 215 U/L   CBC AND DIFF    Collection Time: 01/12/17 11:10 PM   Result Value Ref Range    White Blood Cells 15.8 (H) 4.5 - 11.0 K/UL    RBC 2.80 (L) 4.0 - 5.0 M/UL    Hemoglobin 8.2 (L) 12.0 - 15.0 GM/DL    Hematocrit 16.1 (L) 36 - 45 %    MCV 89.9 80 - 100 FL    MCH 29.3 26 - 34 PG    MCHC 32.6 32.0 - 36.0 G/DL    RDW 09.6 (H) 11 - 15 %    Platelet Count 175 150 - 400 K/UL    MPV 7.8 7 - 11 FL    Neutrophils 88 (H) 41 - 77 %    Lymphocytes 5 (L) 24 - 44 %    Monocytes 7 4 - 12 %    Eosinophils 0 0 - 5 %    Basophils 0 0 - 2 %    Absolute Neutrophil Count 13.90 (H) 1.8 - 7.0 K/UL Absolute Lymph Count 0.80 (L) 1.0 - 4.8 K/UL    Absolute Monocyte Count 1.10 (H) 0 - 0.80 K/UL    Absolute Eosinophil Count 0.00 0 - 0.45 K/UL    Absolute Basophil Count 0.00 0 - 0.20 K/UL   PROTIME INR (PT)    Collection Time: 01/12/17 11:10 PM   Result Value Ref Range    INR 1.1 0.8 - 1.2   PTT (APTT)    Collection Time: 01/12/17 11:10 PM   Result Value Ref Range    APTT 25.3 20.0 - 36.0 SEC   COMPREHENSIVE METABOLIC PANEL    Collection Time: 01/12/17 11:10 PM   Result Value Ref Range    Sodium 131 (L) 137 - 147 MMOL/L    Potassium 5.9 (H) 3.5 - 5.1 MMOL/L  Chloride 96 (L) 98 - 110 MMOL/L    Glucose 99 70 - 100 MG/DL    Blood Urea Nitrogen 58 (H) 7 - 25 MG/DL    Creatinine 81.19 (H) 0.4 - 1.00 MG/DL    Calcium 8.0 (L) 8.5 - 10.6 MG/DL    Total Protein 8.1 (H) 6.0 - 8.0 G/DL    Total Bilirubin 0.4 0.3 - 1.2 MG/DL    Albumin 3.7 3.5 - 5.0 G/DL    Alk Phosphatase 147 (H) 25 - 110 U/L    AST (SGOT) 12 7 - 40 U/L    CO2 22 21 - 30 MMOL/L    ALT (SGPT) 7 7 - 56 U/L    Anion Gap 13 (H) 3 - 12    eGFR Non African American 3 (L) >60 mL/min    eGFR African American 4 (L) >60 mL/min   MAGNESIUM    Collection Time: 01/12/17 11:10 PM   Result Value Ref Range    Magnesium 2.1 1.6 - 2.6 mg/dL   PHOSPHORUS    Collection Time: 01/12/17 11:10 PM   Result Value Ref Range    Phosphorus 5.4 (H) 2.0 - 4.5 MG/DL   VANCOMYCIN TROUGH    Collection Time: 01/12/17 11:10 PM   Result Value Ref Range    Vancomycin Trough 28.0 (H) 10.0 - 20.0 MCG/ML   LACTIC ACID (BG - RAPID LACTATE)    Collection Time: 01/12/17 11:10 PM   Result Value Ref Range    Lactic Acid,BG 1.0 0.5 - 2.0 MMOL/L          Radiology and Other Diagnostic Procedures Review:    Pertinent radiology reviewed.                                                                                       Stephanie Coup. Sibyl Parr, DO  Pager [phone]

## 2017-01-13 NOTE — Progress Notes
0740: Report received, bedside safety check and assessment complete. Monitor and alarms verified. Vital signs consistent with patient trends per flow sheet. Patient is AOx4, moving all extremities, and following commands. Patient complains of a 7/10 pain in right leg - states PRN fentanyl is managing pain at this time. Bed is locked in lowest position, call light within reach, patient's brother at bedside. Please see flow sheet for complete assessment.

## 2017-01-13 NOTE — Consults
Renal Consult    [name]  Admission Date:  01/12/2017         Assessment and Plan     Active Problems:    ESRD (end stage renal disease) on dialysis (HCC)    Morbid obesity (HCC)    Sepsis (HCC)    Hyperkalemia        Debra Lam is a 21 y.o. 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 admitted with sepsis     ESRD on HD  Hx of failed renal transplant in 2008  - ESRD 2/2 congenital/hypoplastic kidneys  - Dialysis schedule: [facility] (recently switched from PD; last PD date 12/2-12/3; first HD session on 01/11/17)  - Dialysis unit: [facility]  - Access: Right tunneled femoral line; PD catheter  - Dry weight: unknown     Sepsis  - febrile, leukocytosis, and tachycardic on admission   - has port, RLE tunneled HD, and PD catheter    Severe right thigh pain  - CT abd and LE reviewed     History of parathyroidectomy  Hyperphosphatemia  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   - 2016 parathyroidectomy   - On phoslo    SOB  - positive for parainfluenza     H/o perirectal abscess 2016  H/o perineal abscess- req I&D/ IV abx 2017  HTN  OSA  Hx of PE  - 2016, likely provoked while on OCP  - Was on eliquis, not currently on anticoagulation    Recommendations:  - renally dose meds  - HD on 01/12/17 for hyperkalemia; hyperK has resolved  - renal diet with low K  - no indication for RRT today  - Will daily monitor for RRT needs  - agree with broad spectrum abx and sepsis workup  - would evaluate for DVT  - follow up OSH cultures  - discussed with IR; IR to evaluate Right femoral HD cath      pt discussed with Dr. Lou Miner, MD  Nephrology fellow  Pager [phone]        History     Reason for Consult: ESRD on HD with hyperkalemia     HPI: Debra Lam is a 21 y.o. 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. She was on dialysis (PD) 06/2007 and had a previous kidney transplant in 2008 that failed shortly after transplant due to adenovirus infection followed by rejection, per record review. Per reports, pt has limited peripheral venous access. Pt has been on peritoneal dialysis, however, she continued to gain weight on PD and thus decision was made to switch pt to HD to assist her to qualify for renal transplant. She had a tunneled right femoral line placed on 01/07/17; she continued PD with last night on 01/09/17. She had her first HD session on 01/11/17. Pt presented was transferred from OSH for right thigh pain and sepsis. Pt reports that her right thigh has been painful since the tunneled right femoral HD placement on 01/07/17.     Pt had emergent dialysis on 01/12/17 for hyperkalemia. She is on BiPAP this morning and was found to be parainfluenza positive. Hyperkalemia has resolved.       Past Medical History   Past Medical History:   Diagnosis Date   ??? Depression    ??? H/O parathyroidectomy    ??? Hypertension    ??? Morbid obesity (HCC)    ??? Perirectal abscess    ??? Pica    ???  PUD (peptic ulcer disease)    ??? Pulmonary embolism (HCC)    ??? Renal failure    ??? Sleep apnea        Past Surgical History:   Procedure Laterality Date   ??? KIDNEY TRANSPLANT  2009   ??? CENTRAL VENOUS CATHETER      hemodialysis catheter placement   ??? TUNNELED VENOUS PORT PLACEMENT         Family History  Family History   Problem Relation Age of Onset   ??? Hypertension Mother    ??? None Reported Brother          Social History   Social History     Socioeconomic History   ??? Marital status: Single     Spouse name: Not on file   ??? Number of children: 0   ??? Years of education: 40   ??? Highest education level: Not on file   Social Needs   ??? Financial resource strain: Not on file   ??? Food insecurity - worry: Not on file   ??? Food insecurity - inability: Not on file   ??? Transportation needs - medical: Not on file   ??? Transportation needs - non-medical: Not on file   Occupational History     Comment: unemployed   Tobacco Use   ??? Smoking status: Never Smoker ??? Smokeless tobacco: Never Used   Substance and Sexual Activity   ??? Alcohol use: No   ??? Drug use: No   ??? Sexual activity: Yes   Other Topics Concern   ??? Military Service Not Asked   ??? Blood Transfusions Yes   ??? Caffeine Concern Not Asked   ??? Occupational Exposure Not Asked   ??? Hobby Hazards Not Asked   ??? Sleep Concern Not Asked   ??? Stress Concern Not Asked   ??? Weight Concern Not Asked   ??? Special Diet Not Asked   ??? Back Care Not Asked   ??? Exercise No   ??? Bike Helmet Not Asked   ??? Seat Belt Not Asked   ??? Self-Exams Not Asked   Social History Narrative   ??? Not on file         Medications  MEDS  amLODIPine 10 mg Oral QDAY   carvedilol 25 mg Oral BID w/meals   cetirizine 5 mg Oral QAM8   heparin (porcine) 5,000 Units Subcutaneous Q8H   lidocaine 1 patch Topical QDAY   piperacillin/tazobactam  (ZOSYN) IVPB 2.25 g Intravenous Q8H*   vancomycin, random dosing 1 each Intravenous Random Dosing    IV MEDS  Prn acetaminophen Q4H PRN 650 mg at 01/13/17 0502, fentaNYL citrate PF Q1H PRN 25 mcg at 01/13/17 0932, HYDROcodone/acetaminophen Q6H PRN, ondansetron (ZOFRAN) IV Q6H PRN, vancomycin, pharmacy to manage Per Pharmacy     HOME MEDS  Prior to Admission Medications   Prescriptions Last Dose Informant Patient Reported? Taking?   amLODIPine (NORVASC) 10 mg tablet   Yes No   Sig: Take 10 mg by mouth daily.   calcitriol (ROCALTROL) 0.5 mcg capsule   Yes No   Sig: Take 1 mcg by mouth daily.   calcium acetate (PHOSLO) 667 mg capsule   Yes No   Sig: Take 1,334 mg by mouth three times daily with meals.   carvedilol (COREG) 25 mg tablet   Yes No   Sig: Take 25 mg by mouth twice daily with meals. Take with food.   cetirizine (ZYRTEC) 5 mg tablet   Yes No  Sig: Take 5 mg by mouth every morning.   fosinopril (MONOPRIL) 40 mg tablet   Yes No   Sig: Take 40 mg by mouth daily.   polyethylene glycol 3350 (MIRALAX) 17 g packet   Yes No   Sig: Take 17 g by mouth daily as needed.   potassium chloride SR (K-DUR) 10 mEq tablet   Yes No Sig: Take 10 mEq by mouth daily. Take with a meal and a full glass of water.      Facility-Administered Medications: None          Review of Systems  Constitutional: fatigue   Eyes: negative  Ears, nose, mouth, throat, and face: negative  Respiratory: SOB  Cardiovascular: negative  Gastrointestinal: negative  Genitourinary:negative  Integument/breast: negative  Hematologic/lymphatic: negative  Musculoskeletal: Right thigh pain  Neurological: negative  Endocrine: negative        Physical Exam        Vital Signs: Last Filed In 24 Hours Vital Signs: 24 Hour Range   BP: 163/92 (12/06 1000)  Temp: 39.3 ???C (102.7 ???F) (12/06 1914)  Pulse: 120 (12/06 1000)  Respirations: 19 PER MINUTE (12/06 1000)  SpO2: 90 % (12/06 0830)  O2 Delivery: Non Invasive Ventilation (CPAP) (12/06 1000)  SpO2 Pulse: 126 (12/06 0900)  Height: 162.6 cm (64) (12/05 2235) BP: (76-218)/(32-138)   Temp:  [37.2 ???C (99 ???F)-39.4 ???C (103 ???F)]   Pulse:  [93-137]   Respirations:  [0 PER MINUTE-45 PER MINUTE]   SpO2:  [90 %-100 %]   O2 Delivery: Non Invasive Ventilation (CPAP)   Intensity Pain Scale (Self Report): Asleep (01/13/17 0600)      Vitals:    01/12/17 2235 01/12/17 2315 01/13/17 0345   Weight: 112.4 kg (247 lb 12.8 oz) 111.4 kg (245 lb 9.5 oz) 111 kg (244 lb 11.4 oz)       Intake/Output Summary (Last 24 hours) at 01/13/2017 1122  Last data filed at 01/13/2017 0400  Gross per 24 hour   Intake 1290 ml   Output 2402 ml   Net -1112 ml        General:  Alert, cooperative; on BiPAP  Head:  Normocephalic, without obvious abnormality, atraumatic  Eyes:  Conjunctivae/corneas clear.  PERRL, EOMs intact.  Neck:  Supple  Chest: port  Lungs:  CTAB anteriorly   Heart:    RRR; S1, S2 normal; No M/R/G  Abdomen:  Soft, non-tender; PD catheter  Extremities:  Right thigh exquisitely tender to light touch around HD catheter; right thigh edema > left thigh;    Skin:   Dry skin; No rashes or lesions  Neurologic:  CNII - XII grossly intact.       Labs     Recent Labs 01/12/17   2310  01/13/17   0327   NA  131*  134*   K  5.9*  3.4*   CL  96*  95*   CO2  22  27   GAP  13*  12   BUN  58*  19   CR  13.89*  5.10*   GLU  99  98   CA  8.0*  8.8   ALBUMIN  3.7  3.8   MG  2.1  1.8   PO4  5.4*  2.5   TSH  0.380   --        Recent Labs      01/12/17   2310  01/13/17   0327   WBC  15.8*  14.7*  HGB  8.2*  8.6*   HCT  25.1*  26.4*   PLTCT  175  172   INR  1.1   --    PTT  25.3   --    AST  12  14   ALT  7  10   ALKPHOS  115*  120*      Estimated Creatinine Clearance: 20.9 mL/min (A) (based on SCr of 5.1 mg/dL (H)).  Vitals:    01/12/17 2235 01/12/17 2315 01/13/17 0345   Weight: 112.4 kg (247 lb 12.8 oz) 111.4 kg (245 lb 9.5 oz) 111 kg (244 lb 11.4 oz)    No results for input(s): PHART, PO2ART in the last 72 hours.    Invalid input(s): PC02A                        Radiology     Pertinent radiology reviewed.

## 2017-01-13 NOTE — Progress Notes
Pharmacy Vancomycin Note  Subjective:   Debra Lam is a 21 y.o. female being treated for concern for line infection.    Objective:     Current Vancomycin Orders   Medication Dose Route Frequency    vancomycin, pharmacy to manage  1 each Service Per Pharmacy    vancomycin, random dosing  1 each Intravenous Random Dosing     Start Date of  Vancomycin therapy: 01/12/2017  Additional Abx: zosyn    White Blood Cells   Date/Time Value Ref Range Status   01/13/2017 0327 14.7 (H) 4.5 - 11.0 K/UL Final   01/12/2017 2310 15.8 (H) 4.5 - 11.0 K/UL Final     Creatinine   Date/Time Value Ref Range Status   01/13/2017 0327 5.10 (H) 0.4 - 1.00 MG/DL Final   13/08/657812/06/2016 [id number] 13.89 (H) 0.4 - 1.00 MG/DL Final     Blood Urea Nitrogen   Date/Time Value Ref Range Status   01/13/2017 0327 19 7 - 25 MG/DL Final     Estimated CrCl: HD    Intake/Output Summary (Last 24 hours) at 01/13/2017 1105  Last data filed at 01/13/2017 0400  Gross per 24 hour   Intake 1290 ml   Output 2402 ml   Net -1112 ml      UOP:    Actual Weight:  111 kg (244 lb 11.4 oz)  Dosing BW:  111 kg   Drug Levels:  Vancomycin Trough   Date/Time Value Ref Range Status   01/12/2017 2310 28.0 (H) 10.0 - 20.0 MCG/ML Final       Assessment:   The vancomycin random obtained on admission yesterday was above the target range for giving a dose. Unknown the timing of this level in relation to the dose. Pt received 1 session of HD after this level but not anticipated that the level will be < 15 w/ one HD session.   Target levels for this patient: trough 10-15 mcg/ml; random < 15 mcg/ml.    Plan:   1. Continue vancomycin dosed based on random levels. No dose today  2. Next scheduled level(s): 12/7 w/ AM labs  3. Pharmacy will continue to monitor and adjust therapy as needed.    Farrell OursMikka Shanon Becvar, [facility]urg[facility] LLCHARMD  01/13/2017

## 2017-01-14 LAB — GRAM STAIN

## 2017-01-14 LAB — HEPATITIS PANEL, ACUTE
Lab: NEGATIVE
Lab: NEGATIVE

## 2017-01-14 LAB — C REACTIVE PROTEIN (CRP): Lab: 22 mg/dL — ABNORMAL HIGH (ref <1.0)

## 2017-01-14 LAB — PROTIME INR (PT): Lab: 1.3 % — ABNORMAL HIGH (ref 0.8–1.2)

## 2017-01-14 LAB — CORTISOL,RANDOM: Lab: 11 ug/dL (ref 5.0–20.0)

## 2017-01-14 LAB — FIBRINOGEN: Lab: 563 mg/dL — ABNORMAL HIGH (ref 200–400)

## 2017-01-14 LAB — LDH-LACTATE DEHYDROGENASE: Lab: 205 U/L (ref >60)

## 2017-01-14 LAB — CBC AND DIFF: Lab: 14 10*3/uL — ABNORMAL HIGH (ref 4.5–11.0)

## 2017-01-14 LAB — HAPTOGLOBIN: Lab: 336 mg/dL — ABNORMAL HIGH (ref 16–200)

## 2017-01-14 LAB — CBC
Lab: 12 K/UL — ABNORMAL HIGH (ref 4.5–11.0)
Lab: 2.3 M/UL — ABNORMAL LOW (ref 4.0–5.0)
Lab: 6.8 g/dL — ABNORMAL LOW (ref 12.0–15.0)
Lab: 90 FL (ref 80–100)

## 2017-01-14 LAB — HIV 1& 2 AG-AB SCRN W REFLEX HIV 1 PCR QUANT: Lab: NEGATIVE

## 2017-01-14 LAB — COMPREHENSIVE METABOLIC PANEL: Lab: 130 MMOL/L — ABNORMAL LOW (ref 137–147)

## 2017-01-14 LAB — PHOSPHORUS: Lab: 6.6 mg/dL — ABNORMAL HIGH (ref 2.0–4.5)

## 2017-01-14 LAB — MAGNESIUM: Lab: 1.9 mg/dL — ABNORMAL LOW (ref 1.6–2.6)

## 2017-01-14 MED ORDER — HYDROCODONE-ACETAMINOPHEN 5-325 MG PO TAB
1 | ORAL | 0 refills | Status: DC | PRN
Start: 2017-01-14 — End: 2017-01-15
  Administered 2017-01-14 – 2017-01-15 (×2): 1 via ORAL

## 2017-01-14 MED ORDER — ANTICOAG SODIUM CITRATE SOLN 5ML
1-5 mL | Freq: Once | 0 refills | Status: DC
Start: 2017-01-14 — End: 2017-01-14

## 2017-01-14 MED ORDER — HEPARIN (PORCINE) BOLUS FROM DIALYSIS SYRINGE
1000 [IU] | INTRAVENOUS | 0 refills | Status: DC | PRN
Start: 2017-01-14 — End: 2017-01-15

## 2017-01-14 MED ORDER — ANTICOAG SODIUM CITRATE SOLN 5ML
1-5 mL | Freq: Once | 0 refills | Status: DC
Start: 2017-01-14 — End: 2017-01-15

## 2017-01-14 MED ORDER — HEPARIN (PORCINE) 1000 UNITS/ML FOR USE IN DIALYSIS MACHINE
500 [IU]/h | INTRAVENOUS | 0 refills | Status: DC
Start: 2017-01-14 — End: 2017-01-15
  Administered 2017-01-14: 21:00:00 1000 [IU]/h via INTRAVENOUS

## 2017-01-14 MED ORDER — SODIUM CHLORIDE 0.9 % IV SOLP
300 mL | INTRAVENOUS | 0 refills | Status: DC | PRN
Start: 2017-01-14 — End: 2017-01-14

## 2017-01-14 MED ORDER — SODIUM CHLORIDE 0.9 % IV SOLP
1000 mL | INTRAVENOUS | 0 refills | Status: DC | PRN
Start: 2017-01-14 — End: 2017-01-15

## 2017-01-14 MED ORDER — FENTANYL CITRATE (PF) 50 MCG/ML IJ SOLN
25-100 ug | Freq: Once | INTRAVENOUS | 0 refills | Status: CP
Start: 2017-01-14 — End: ?
  Administered 2017-01-15: 01:00:00 50 ug via INTRAVENOUS

## 2017-01-14 MED ORDER — SODIUM CHLORIDE 0.9 % IV SOLP
1000 mL | INTRAVENOUS | 0 refills | Status: DC | PRN
Start: 2017-01-14 — End: 2017-01-14

## 2017-01-14 MED ORDER — SODIUM CHLORIDE 0.9 % IV SOLP
300 mL | INTRAVENOUS | 0 refills | Status: CP | PRN
Start: 2017-01-14 — End: ?
  Administered 2017-01-14: 20:00:00 300 mL via INTRAVENOUS

## 2017-01-14 MED ORDER — SODIUM CHLORIDE 0.9 % IV SOLP
250 mL | INTRAVENOUS | 0 refills | Status: DC | PRN
Start: 2017-01-14 — End: 2017-01-14

## 2017-01-14 MED ORDER — SODIUM CHLORIDE 0.9 % IV SOLP
250 mL | INTRAVENOUS | 0 refills | Status: DC | PRN
Start: 2017-01-14 — End: 2017-01-15

## 2017-01-14 MED ORDER — PERFLUTREN LIPID MICROSPHERES 1.1 MG/ML IV SUSP
1-20 mL | Freq: Once | INTRAVENOUS | 0 refills | Status: CP
Start: 2017-01-14 — End: ?
  Administered 2017-01-14: 15:00:00 2 mL via INTRAVENOUS

## 2017-01-14 MED ADMIN — ANTICOAG SODIUM CITRATE SOLN 5ML [210585]: 5 mL | INTRAVENOUS_CENTRAL | @ 20:00:00 | Stop: 2017-01-14 | NDC 54029018302

## 2017-01-14 MED ADMIN — HEPARIN (PORCINE) 1,000 UNIT/ML IJ SOLN [10176]: 10000 [IU] | INTRAVENOUS_CENTRAL | @ 21:00:00 | Stop: 2017-01-14 | NDC 63323054011

## 2017-01-14 NOTE — Progress Notes
Spoke with [employer] Charge Nurse who reports that Ms. Maurine MinisterDennis will get her line pulled today after dialysis. They understand it is infected and she needs it removed tonight after dialysis.

## 2017-01-14 NOTE — Progress Notes
Dr Sibyl Parrhapman [phone]([id number]) notified of patient's 103.3 F temp  Orders to provide tylenol and continue monitoring.

## 2017-01-14 NOTE — Progress Notes
RT Adult Assessment Note    NAME:Debra Lam             MRN: [id number]             DOB:11/19/1995          AGE: 21 y.o.  ADMISSION DATE: 01/12/2017             DAYS ADMITTED: LOS: 2 days    RT Treatment Plan:       Protocol Plan: Procedures  Oxygen/Humidity: O2 to keep SpO2 > 92%  Monitoring: Pulse oximetry BID & PRN  Comment: OSA    Additional Comments:  Impressions of the patient: Pt Alert  Intervention(s)/outcome(s): Bipap at noc for OSA    Vital Signs:  Pulse: Pulse: 105  RR: Respirations: 16 PER MINUTE  SpO2: SpO2: 100 %  O2 Device: $$ O2 Device: Cannula  Liter Flow: O2 Liter Flow: 2 lpm  O2%:    Breath Sounds:  Clear/ Decrease  Respiratory Effort: Respiratory Effort: Non-Labored

## 2017-01-14 NOTE — Other
Critical result or procedure called (document test and value, and read back): R chest port + blood culture gram + cocci resembling strep  Time MD/NP Notified:  1550  MD/NP Name:  Joni ReiningNicole, APRN  MD/NP Response/Orders Given:  No new orders.

## 2017-01-14 NOTE — Progress Notes
[id number] - patient removed CPAP to get up to the bedside commode - refused to replace CPAP on return to bed    2200 Per[facility]    2225 Dr Sibyl Parrhapman ([id number]) notified of inability to obtain second peripheral blood culture at this time - orders to draw from accessed right SC port.    2300 Rt called to discuss patient's decreased Sp02, poor perfusion waveform, and the and need to turn NC up to 4L p NC because patient declines to wear CPAP at this time.    2320  RT at bedside attempted with trouble shooting SP02 and strongly encouraged patient to wear CPAP - patient refused RT's request at this time.    2340 Second blood culture send from accessed right SC port.

## 2017-01-14 NOTE — Transfer Summaries
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 To ICU for urgent HD and management. On zosyn/zyvox for sepsis, blood cx + for Gm + cocci. RVP + for parainfluenza. Renal consulted, plan for HD today and removal of cath. ID following as well. Stable for floor today.   Significant Medication Information (to include antibiotic duration/indication, anticoagulation and steroids, etc.):    Zosyn, zyvox - ID following  Held PTA coreg and norvasc after this AM doses 2/2 soft BPs and plan for HD  PRN norco for thigh pain, use with caution 2/2 renal failure and sedation yesterday  Procedures With Dates:    Plan for HD cath removal  Consults:  ID  Renal  Follow-Up Items:  Cultures - blood from HD and Port; HD cath site swab, HD cath tip cx   Renal recs  Activity/Weight bearing status:  As tolerated  Discharge Plan:  Likely home - pt states [facility] [place] [facility]     Tammi Boulier, APRN  Pulmonary Critical Care  Pager: [phone]    M3 team pager [phone]

## 2017-01-14 NOTE — Progress Notes
Report received and care assumed. Patient alert and oriented x4, see eMAR for pain or discomfort interventions.  VSS per patient trends. Initial assessment completed and charted per ICU flowsheet. Plan of care reviewed. Lines, gtts, and monitor verified.     1030-Pt now tele status.     1200-Pt c/o R leg pain, pain meds not due at this time. Joni ReiningNicole, APRN notified.    1315-Pt taken to IP HD at this time, updated Arlys JohnBrian, RN with needing a blood culture from HD line and 1 unit of PRBC to be given.     1645-Full report given to unit 46 Elmyra RicksNicolas, RN.   1700-Notified Joni ReiningNicole, APRN regarding pt's 1 unit of PRBC. Blood bank still screening antibodies.  1800-Pt's belongings taken to 4605, pt will be transferring to 4605 after HD is completed.  Notified unit 2446 Nicolas, RN that blood bank found PRBC for pt and able to give.    1815-IR resident pager [phone]

## 2017-01-14 NOTE — Progress Notes
[facility]   Brother at bedside - questions answered

## 2017-01-14 NOTE — Progress Notes
OCCUPATIONAL THERAPY      OT attempted to see pt this pm although pt unavailable; down for dialysis. OT will continue to follow and complete evaluation as available.  Simon Rheinraci Dezi Brauner, OTR/L [phone]

## 2017-01-14 NOTE — Progress Notes
Renal Consult    Debra Lam  Admission Date:  01/12/2017         Assessment and Plan     Active Problems:    ESRD (end stage renal disease) on dialysis (HCC)    Morbid obesity (HCC)    Sepsis (HCC)    Hyperkalemia    Infection due to parainfluenza virus 4    Encephalopathy    Tachycardia    HTN (hypertension)    OSA (obstructive sleep apnea)    Acute respiratory failure with hypoxia (HCC)    Anemia        Debra Lam is a 21 y.o. 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 admitted with sepsis     ESRD on HD  Hx of failed renal transplant in 2008  - ESRD 2/2 congenital/hypoplastic kidneys  - Dialysis schedule: [facility] (recently switched from PD; last PD date 12/2-12/3; first HD session on 01/11/17)  - Dialysis unit: [facility]  - Access: Right tunneled femoral line; PD catheter  - Dry weight: unknown     Sepsis  - febrile, leukocytosis, and tachycardic on admission   - has port, RLE tunneled HD, and PD catheter    Severe right thigh pain  - CT abd and LE reviewed     History of parathyroidectomy  Hyperphosphatemia  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 Pt seen on HD, plan for 4 hr run  ??? Written for 1-2L UF but probably will not be able to remove given borderline hypotension  ??? Plan for IR to remove femoral catheter after HD and send tip for culture  ??? We will evaluate her dialysis needs on a daily basis. I suspect she will do fine for a few days without dialysis. If needed, we can bridge her by restarting PD until it is deemed safe to replace a tunneled HD catheter.      Francena Hanly  Pager [phone]        History     HPI: Debra Lam is a 21 y.o. female Pt seen on dialysis this afternoon. She feels fine other than her fevers. Blood cultures grew Group B Strep in 1/2 bottles. She remains on Zosyn and linezolid. I/O +1272 mL. BP is a little low at start of dialysis.    Past Medical History   Past Medical History:   Diagnosis Date   ??? Depression    ??? H/O parathyroidectomy    ??? Hypertension    ??? Morbid obesity (HCC)    ??? Perirectal abscess    ??? Pica    ??? PUD (peptic ulcer disease)    ??? Pulmonary embolism (HCC)    ??? Renal failure    ??? Sleep apnea        Past Surgical History:   Procedure Laterality Date   ??? KIDNEY TRANSPLANT  2009   ??? CENTRAL VENOUS CATHETER      hemodialysis catheter placement   ??? TUNNELED VENOUS PORT PLACEMENT         Family History  Family History   Problem Relation Age of  Onset   ??? Hypertension Mother    ??? None Reported Brother          Social History   Social History     Socioeconomic History   ??? Marital status: Single     Spouse name: Not on file   ??? Number of children: 0   ??? Years of education: 48   ??? Highest education level: Not on file   Social Needs   ??? Financial resource strain: Not on file   ??? Food insecurity - worry: Not on file   ??? Food insecurity - inability: Not on file   ??? Transportation needs - medical: Not on file   ??? Transportation needs - non-medical: Not on file   Occupational History     Comment: unemployed   Tobacco Use   ??? Smoking status: Never Smoker   ??? Smokeless tobacco: Never Used   Substance and Sexual Activity   ??? Alcohol use: No   ??? Drug use: No   ??? Sexual activity: Yes   Other Topics Concern   ??? Military Service Not Asked   ??? Blood Transfusions Yes   ??? Caffeine Concern Not Asked   ??? Occupational Exposure Not Asked   ??? Hobby Hazards Not Asked   ??? Sleep Concern Not Asked   ??? Stress Concern Not Asked   ??? Weight Concern Not Asked   ??? Special Diet Not Asked   ??? Back Care Not Asked   ??? Exercise No   ??? Bike Helmet Not Asked   ??? Seat Belt Not Asked   ??? Self-Exams Not Asked   Social History Narrative   ??? Not on file Medications  MEDS    amLODIPine 10 mg Oral QDAY   anticoagulant sodium citrate   NOW   anticoagulant sodium citrate 1-5 mL Intra-catheter Once in IP Dialysis   carvedilol 25 mg Oral BID w/meals   cetirizine 5 mg Oral QAM8   heparin (porcine)   NOW   lidocaine 1 patch Topical QDAY   linezolid  (ZYVOX) IVPB 600 mg Intravenous Q12H*   piperacillin/tazobactam  (ZOSYN) IVPB 2.25 g Intravenous Q8H*    IV MEDS  ??? heparin (porcine) 1,000 units/mL syringe for use in dialysis machine Stopped (01/14/17 0800)     Prn acetaminophen Q4H PRN 650 mg at 01/13/17 2343, emu PRN, heparin IP Dialysis PRN, HYDROcodone/acetaminophen Q8H PRN 1 tablet at 01/14/17 1047, ondansetron (ZOFRAN) IV Q6H PRN, sodium chloride 0.9% (NS) IP Dialysis PRN, sodium chloride 0.9% (NS) IP Dialysis PRN, sodium chloride 0.9% (NS) IP Dialysis PRN     HOME MEDS  Prior to Admission Medications   Prescriptions Last Dose Informant Patient Reported? Taking?   amLODIPine (NORVASC) 10 mg tablet   Yes No   Sig: Take 10 mg by mouth daily.   calcitriol (ROCALTROL) 0.5 mcg capsule   Yes No   Sig: Take 1 mcg by mouth daily.   calcium acetate (PHOSLO) 667 mg capsule   Yes No   Sig: Take 1,334 mg by mouth three times daily with meals.   carvedilol (COREG) 25 mg tablet   Yes No   Sig: Take 25 mg by mouth twice daily with meals. Take with food.   cetirizine (ZYRTEC) 5 mg tablet   Yes No   Sig: Take 5 mg by mouth every morning.   fosinopril (MONOPRIL) 40 mg tablet   Yes No   Sig: Take 40 mg by mouth daily.   polyethylene glycol 3350 (MIRALAX) 17 g  packet   Yes No   Sig: Take 17 g by mouth daily as needed.   potassium chloride SR (K-DUR) 10 mEq tablet   Yes No   Sig: Take 10 mEq by mouth daily. Take with a meal and a full glass of water.      Facility-Administered Medications: None          Review of Systems  12 pt ROS was negative        Physical Exam        Vital Signs: Last Filed In 24 Hours Vital Signs: 24 Hour Range   BP: 116/72 (12/07 1430) Temp: 37.3 ???C (99.2 ???F) (12/07 1200)  Pulse: 86 (12/07 1430)  Respirations: 28 PER MINUTE (12/07 1430)  SpO2: 91 % (12/07 1430)  O2 Delivery: None (Room Air) (12/07 1400)  SpO2 Pulse: 86 (12/07 1430)  Height: 162.6 cm (64) (12/07 0904) BP: (97-151)/(50-99)   Temp:  [37.3 ???C (99.2 ???F)-39.6 ???C (103.3 ???F)]   Pulse:  [86-118]   Respirations:  [0 PER MINUTE-39 PER MINUTE]   SpO2:  [73 %-100 %]   O2 Delivery: None (Room Air)   Intensity Pain Scale (Self Report): 8 (01/14/17 0800)      Vitals:    01/13/17 0345 01/14/17 0500 01/14/17 0904   Weight: 111 kg (244 lb 11.4 oz) 112 kg (246 lb 14.6 oz) 112 kg (246 lb 14.6 oz)       Intake/Output Summary (Last 24 hours) at 01/14/2017 1456  Last data filed at 01/14/2017 1430  Gross per 24 hour   Intake 1202 ml   Output ???   Net 1202 ml        General:  Alert, cooperative, obese female  Head:  Normocephalic, without obvious abnormality, atraumatic  Eyes:  Conjunctivae/corneas clear.  PERRL, EOMs intact.  Neck:  Supple  Chest: port  Lungs:  CTAB anteriorly   Heart:    RRR; S1, S2 normal; No M/R/G  Abdomen:  Soft, non-tender; PD catheter, site clean and dry  Extremities:  Right thigh exquisitely tender to light touch around HD catheter; right thigh edema > left thigh;    Skin:   Dry skin; No rashes or lesions  Neurologic:  CNII - XII grossly intact.       Labs     Recent Labs      01/12/17   2310  01/13/17   0327  01/14/17   0418   NA  131*  134*  130*   K  5.9*  3.4*  4.6   CL  96*  95*  93*   CO2  22  27  27    GAP  13*  12  10   BUN  58*  19  41*   CR  13.89*  5.10*  10.94*   GLU  99  98  104*   CA  8.0*  8.8  7.1*   ALBUMIN  3.7  3.8  3.3*   MG  2.1  1.8  1.9   PO4  5.4*  2.5  6.6*   TSH  0.380   --    --        Recent Labs      01/12/17   2310  01/13/17   0327  01/14/17   0418  01/14/17   0930  01/14/17   1035   WBC  15.8*  14.7*  14.1*  12.1*   --    HGB  8.2*  8.6*  7.1*  6.8*   --    HCT  25.1*  26.4*  22.0*  21.1*   --    PLTCT  175  172  142*  137*   -- INR  1.1   --    --    --   1.3*   PTT  25.3   --    --    --    --    AST  12  14  17    --    --    ALT  7  10  11    --    --    ALKPHOS  115*  120*  89   --    --       Estimated Creatinine Clearance: 9.8 mL/min (A) (based on SCr of 10.94 mg/dL (H)).  Vitals:    01/13/17 0345 01/14/17 0500 01/14/17 0904   Weight: 111 kg (244 lb 11.4 oz) 112 kg (246 lb 14.6 oz) 112 kg (246 lb 14.6 oz)    No results for input(s): PHART, PO2ART in the last 72 hours.    Invalid input(s): PC02A                        Radiology     Pertinent radiology reviewed.

## 2017-01-14 NOTE — Progress Notes
PHYSICAL THERAPY  NOTE         Patient denies changes from baseline mobility and reports no history of balance loss or falls in the last three months.  Patient endorses no concerns with mobility at home.  Currently, the patient is ambulating (in room/ on unit) without difficulty.       Encouraged patient to continue mobilization while in the hospital and discussed the mobility plan with the bedside nursing staff.  Physical therapy services will be discontinued at this time, please re-consult if the patient has a change in mobility status.      Therapist: Ernestine McmurrayBrandon Hennessy Bartel, PT  Date: 01/14/2017

## 2017-01-14 NOTE — Progress Notes
Call from OSH ([facility] [phone] - lab number),  Morrie SheldonAshley, who reports blood cultures drawn in the ER  01/12/17 @1745  from patient's left arm returned a critical result of   GRAM POSITIVE COCCI RESEMBLING STREPTOCOCCI.    Blood cultures drawn in the [facility] ED - 01/12/17 @ 2300 from right HD cath also showed GRAM POSITIVE COCCI RESEMBLING STREPTOCOCCI.    No changes to plan of care at this time.

## 2017-01-14 NOTE — Procedures
Report received from Primary Care RN, Halina AndreasKeating  Pt arrived via bed and attached to nbp, cardiac and oximetry monitoring.   Pt ID and consent verified.   Right Tunneled dialysis catheter accessed, with both ports aspirating and flushing without difficulty.   HD ORDERS: Conventional Hemodialysis for 4.0 hours, DiaCap Pro 13, 1-2 L net UF pr Dr. Cathie HoopsYu, 2K 2.5Ca bath per protocol, Max Blood Blow 400, Max Dialysate Flow 800.   1344 TX START. Prescribed treatment parameters achieved. Lines and access secure In view and intact. Face uncovered and in view. Dr. Cathie HoopsYu notified patient on treatment and estimated time off.   1745TX END. Net UF 700 MLS   Tunneled Hemodialysis Line flushed and packed with citrate, See Hemodialysis Flow Sheet and MAR for details.. Report called to Primary Care RN,  Halina AndreaskEATING

## 2017-01-14 NOTE — Case Management (ED)
Case Management Progress Note    NAME:Debra Lam                          MRN: [id number]              DOB:04/23/1995          AGE: 21 y.o.  ADMISSION DATE: 01/12/2017             DAYS ADMITTED: LOS: 2 days      Todays Date: 01/14/2017    Plan  DC planning ongoing.   EMR reviewed. Discussed patient with SW.     Interventions  ? Support      ? Info or Referral     [facility]     ? Discharge Planning       ? Medication Needs      ? Financial      ? Legal      ? Other        Disposition  ? Expected Discharge Date    Expected Discharge Date: 01/18/17  ? Transportation   Does the patient need discharge transport arranged?: No  Transportation Name, Phone and Availability #1: Johny DrillingJermaine Murphy (brother) - [phone] or Asher MuirJamie (sister-in-law) - [phone]  ? Next Level of Care (Acute Psych discharges only)      ? Discharge Disposition                  Durable Medical Equipment      No service has been selected for the patient.      [facility] Destination      No service has been selected for the patient.      [facility]      No service has been selected for the patient.      [facility]      Service Provider Request Status Selected Services Address Phone Number Fax Number    [facility] [address], [place] [phone]        Mertie MooresMeghan [name], RN, BSN   Nurse Case Manager  Pediatrics/ PICU  Phone [phone]  Pager [phone]                                                                     Nurse Case Manager Weekend Needs      Instructions for CM W/E Staff:  Send Run sheets to dialysis and notify of dc.     Teaching Needs Prior to DC:  n/a    Expected delivery of any needed medication/supplies/equipment:  n/a    Agency Expected SOC and Services Planned:

## 2017-01-14 NOTE — Care Coordination-Inpatient
Patient stable for floor transfer, bed assigned in Unit 46. Will place on MPF. MICU to take calls overnight.       Sylvan CheeseSidra Jodye Scali, MD  AOD

## 2017-01-15 ENCOUNTER — Inpatient Hospital Stay: Admit: 2017-01-15 | Discharge: 2017-01-15 | Payer: MEDICARE

## 2017-01-15 DIAGNOSIS — T80211A Bloodstream infection due to central venous catheter, initial encounter: Principal | ICD-10-CM

## 2017-01-15 LAB — CBC AND DIFF: Lab: 9.4 K/UL (ref 4.5–11.0)

## 2017-01-15 LAB — IRON + BINDING CAPACITY + %SAT+ FERRITIN
Lab: 10 % — ABNORMAL LOW (ref 28–42)
Lab: 207 ug/dL — ABNORMAL LOW (ref 270–380)
Lab: 21 ug/dL — ABNORMAL LOW (ref 50–160)
Lab: 610 ng/mL — ABNORMAL HIGH (ref 10–200)

## 2017-01-15 LAB — COMPREHENSIVE METABOLIC PANEL: Lab: 134 MMOL/L — ABNORMAL LOW (ref 137–147)

## 2017-01-15 LAB — MAGNESIUM: Lab: 1.8 mg/dL — ABNORMAL LOW (ref 1.6–2.6)

## 2017-01-15 LAB — PHOSPHORUS: Lab: 4.7 mg/dL — ABNORMAL HIGH (ref 2.0–4.5)

## 2017-01-15 LAB — HEMOGLOBIN & HEMATOCRIT
Lab: 20 % — ABNORMAL LOW (ref 36–45)
Lab: 6.6 g/dL — ABNORMAL LOW (ref 12.0–15.0)

## 2017-01-15 LAB — PTT (APTT): Lab: 24 s (ref 24.0–36.5)

## 2017-01-15 MED ORDER — TIZANIDINE 4 MG PO TAB
4 mg | ORAL | 0 refills | Status: DC | PRN
Start: 2017-01-15 — End: 2017-01-26
  Administered 2017-01-15 – 2017-01-19 (×5): 4 mg via ORAL

## 2017-01-15 MED ORDER — HYDROCODONE-ACETAMINOPHEN 5-325 MG PO TAB
1 | ORAL | 0 refills | Status: DC | PRN
Start: 2017-01-15 — End: 2017-01-15
  Administered 2017-01-15: 10:00:00 1 via ORAL

## 2017-01-15 MED ORDER — CALCIUM ACETATE 667 MG PO CAP
1334 mg | Freq: Three times a day (TID) | ORAL | 0 refills | Status: DC
Start: 2017-01-15 — End: 2017-01-28
  Administered 2017-01-15 – 2017-01-27 (×15): 1334 mg via ORAL

## 2017-01-15 MED ORDER — HEPARIN (PORCINE) BOLUS FOR CONTINUOUS INF (VIAL)
80 [IU]/kg | Freq: Once | INTRAVENOUS | 0 refills | Status: CP
Start: 2017-01-15 — End: ?
  Administered 2017-01-15: 21:00:00 9180 [IU] via INTRAVENOUS

## 2017-01-15 MED ORDER — HYDROCODONE-ACETAMINOPHEN 5-325 MG PO TAB
1 | ORAL | 0 refills | Status: DC | PRN
Start: 2017-01-15 — End: 2017-01-19
  Administered 2017-01-15 – 2017-01-19 (×7): 1 via ORAL

## 2017-01-15 MED ORDER — HEPARIN (PORCINE) 1,000 UNIT/ML IJ SOLN
20-40 [IU]/kg | INTRAVENOUS | 0 refills | Status: DC
Start: 2017-01-15 — End: 2017-01-19
  Administered 2017-01-16: 13:00:00 2296 [IU] via INTRAVENOUS
  Administered 2017-01-16: 20:00:00 2300 [IU] via INTRAVENOUS

## 2017-01-15 MED ORDER — ACETAMINOPHEN 325 MG PO TAB
650 mg | ORAL | 0 refills | Status: DC | PRN
Start: 2017-01-15 — End: 2017-01-15
  Administered 2017-01-15: 14:00:00 650 mg via ORAL

## 2017-01-15 MED ORDER — HEPARIN (PORCINE) IN 5 % DEX 20,000 UNIT/500 ML (40 UNIT/ML) IV SOLP
100-2000 [IU]/h | INTRAVENOUS | 0 refills | Status: DC
Start: 2017-01-15 — End: 2017-01-19
  Administered 2017-01-15: 21:00:00 2000 [IU]/h via INTRAVENOUS
  Administered 2017-01-16: 15:00:00 2100 [IU]/h via INTRAVENOUS
  Administered 2017-01-16: 06:00:00 2000 [IU]/h via INTRAVENOUS
  Administered 2017-01-17 – 2017-01-19 (×7): 2200 [IU]/h via INTRAVENOUS

## 2017-01-15 MED ORDER — SENNA/DOCUSATE(#) 8.8/50MG/10ML PO SOLN
10 mL | Freq: Two times a day (BID) | ORAL | 0 refills | Status: DC
Start: 2017-01-15 — End: 2017-01-29
  Administered 2017-01-24: 16:00:00 10 mL via ORAL

## 2017-01-15 MED ORDER — PANTOPRAZOLE 40 MG IV SOLR
40 mg | Freq: Two times a day (BID) | INTRAVENOUS | 0 refills | Status: DC
Start: 2017-01-15 — End: 2017-01-19
  Administered 2017-01-15 – 2017-01-19 (×8): 40 mg via INTRAVENOUS

## 2017-01-15 NOTE — Other
Immediate Post Procedure Note    Date:  01/14/2017                                         Attending Physician:   B. Ree Kidauster, MD  Performing Provider:  Paschal DoppSilas H. Mayford KnifeWilliams, MD    Pre/Post Procedure Diagnosis:  Suspected right femoral line infection  Indications:  Suspected right femoral line infection    Anesthesia: Local 5 mL 2% lidocaine without epinephrine  Procedure(s):  Right femoral tunneled line removal  Findings:  Successful removal of tunneled right femoral venous catheter. Catheter tip collected and sent to the lab in sterile fashion.     Estimated Blood Loss:  Minimal  Specimen(s) Removed/Disposition:  Yes, sent to pathology  Complications: None  Patient Tolerated Procedure: Well  Post-Procedure Condition:  stable    Debra Krull H. Mayford KnifeWilliams, MD  Pager [phone]

## 2017-01-15 NOTE — Consults
Gastroenterology Consult Note  Patient Name:Debra Lam         ZOX:[id number]  Admission Date: 01/12/2017 10:33 PM        Reason for consult:  acute anemia. Hx of PUD due to NSAID. need AC for DVT    Assessment:  Debra Lam is a 21 y.o. 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 We are consulted to perform EGD to r/o any possible bleeding GI sources.    # Acute on chronic anemia  # iron studies suggestive of mixed picture, IDA and ACD  # no signs of overt GI bleeding, no abdominal pain, no reflux symptoms  # hx of PUD 9 years ago  DDx: PUD, esophagtitis, gastritis, AVMs  - given her down-trending Hgb she was started on PPI iv bid    Recommendations:  - Keep NPO past midnight on Sunday for EGD on Monday  - please obtain records from previous [facility] [place], [place])    Patient discussed with attending on service, Dr. Jomarie Longs.    Renea Ee, MD  Gastroenterology & Hepatology Fellow  Pager [phone]      -----------------------------  HPI/Subjective:  Debra Lam is a 21 y.o. 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 She was transferred from OSH for worsening renal failure and R thigh pain and at R femoral access and fever and leukocytosis, she was started on Vancomycin and Zosyn for suspected line infection, she was also hyperkalemic and was given insulin and required urgent dialysis.    She also has a hx of PUD and currently her Hgb dropped mildly. We are being consulted to perform EGD to r/o any source of bleeding since patient will also require anticoagulation due to newly diagnosed DVT.    She denies any hematemesis, melena, hematochezia. She denies any NSAID use, she has also stopped taking PPI, since she does not have any reflux or dyspepsia symptoms anymore. Her PUD hx is from 9 years ago, when she was 21 yrs old, at that time she had severe epigastric pain and melena.    US doppler of LE 01/15/17 shows partially occlusive acute appearing thrombus within the right external iliac and right common femoral veins.    CT Pelvis w/o contrast 01/13/17 shows Right lower extremity tunneled femoral central venous catheter in place. There is subcutaneous stranding along the catheter tract within the upper thigh and groin, which is likely postprocedural in nature. Mild cellulitis is less likely. There is no evidence of abscess or subcutaneous gas or significant fascial fluid to suggest necrotizing fasciitis.    Pt denies any smoking, ETOH abuse, recreational drug use.    She denies any FHx of GI cancers.    PMH:  Past Medical History:   Diagnosis Date   ??? Depression    ??? H/O parathyroidectomy    ??? Hypertension    ??? Morbid obesity (HCC)    ??? Perirectal abscess    ??? Pica    ??? PUD (peptic ulcer disease)    ??? Pulmonary embolism (HCC)    ??? Renal failure    ??? Sleep apnea  Past Surgical History:   Procedure Laterality Date   ??? KIDNEY TRANSPLANT  2009   ??? CENTRAL VENOUS CATHETER      hemodialysis catheter placement   ??? TUNNELED VENOUS PORT PLACEMENT       Social History     Socioeconomic History   ??? Marital status: Single     Spouse name: Not on file   ??? Number of children: 0   ??? Years of education: 77   ??? Highest education level: Not on file   Social Needs   ??? Financial resource strain: Not on file   ??? Food insecurity - worry: Not on file   ??? Food insecurity - inability: Not on file   ??? Transportation needs - medical: Not on file   ??? Transportation needs - non-medical: Not on file   Occupational History     Comment: unemployed   Tobacco Use ??? Smoking status: Never Smoker   ??? Smokeless tobacco: Never Used   Substance and Sexual Activity   ??? Alcohol use: No   ??? Drug use: No   ??? Sexual activity: Yes   Other Topics Concern   ??? Military Service Not Asked   ??? Blood Transfusions Yes   ??? Caffeine Concern Not Asked   ??? Occupational Exposure Not Asked   ??? Hobby Hazards Not Asked   ??? Sleep Concern Not Asked   ??? Stress Concern Not Asked   ??? Weight Concern Not Asked   ??? Special Diet Not Asked   ??? Back Care Not Asked   ??? Exercise No   ??? Bike Helmet Not Asked   ??? Seat Belt Not Asked   ??? Self-Exams Not Asked   Social History Narrative   ??? Not on file     Family History   Problem Relation Age of Onset   ??? Hypertension Mother    ??? None Reported Brother        Current medications:  No current facility-administered medications on file prior to encounter.      Current Outpatient Medications on File Prior to Encounter   Medication Sig Dispense Refill   ??? amLODIPine (NORVASC) 10 mg tablet Take 10 mg by mouth daily.     ??? calcitriol (ROCALTROL) 0.5 mcg capsule Take 1 mcg by mouth daily.     ??? calcium acetate (PHOSLO) 667 mg capsule Take 1,334 mg by mouth three times daily with meals.     ??? carvedilol (COREG) 25 mg tablet Take 25 mg by mouth twice daily with meals. Take with food.     ??? cetirizine (ZYRTEC) 5 mg tablet Take 5 mg by mouth every morning.     ??? Diphenhydramine-Acetaminophen (TYLENOL PM EXTRA STRENGTH) 25-500 mg tab tablet Take 2 tablets by mouth at bedtime as needed.     ??? fosinopril (MONOPRIL) 40 mg tablet Take 40 mg by mouth daily.     ??? HYDROcodone/acetaminophen (NORCO) 5/325 mg tablet Take 1 tablet by mouth every 4 hours as needed for Pain     ??? polyethylene glycol 3350 (MIRALAX) 17 g packet Take 17 g by mouth daily as needed.     ??? potassium chloride SR (K-DUR) 10 mEq tablet Take 10 mEq by mouth daily. Take with a meal and a full glass of water.         Review of Systems:  General: No fevers, + chills, no weight loss or gain, no B-symptoms. Oropharynx: No lesion, ulcer.  Neck: No pain or decreased ROM.  Pulm:  No dyspnea, cough.  CV: No palpitations, no chest pain.  Abdomen: No abdominal pain, brown stool, no changes in bowel habits. No N/V.  Extremities: R L swelling and pain, no deformity.  Heme: No for easy bleeding or bruising.  Neuro: No lightheadedness/dizziness, no LOC.  Skin: No rashes, no jaundice.  Please see HPI for additional pertinent documentation    Physical Exam:  Vitals:    01/15/17 0600 01/15/17 0748 01/15/17 1215 01/15/17 1221   BP:  130/70 (!) 143/102 150/89   Pulse:  92 106    Temp:  37 ???C (98.6 ???F) 37.3 ???C (99.1 ???F)    SpO2:  99% 100%    Weight: 114.8 kg (253 lb 1.4 oz)      Height:         General - Alert and oriented, no acute distress. Sleepy from pain medication. Obese.  Head - Normocephalic, atraumatic.   Eyes -  EOMI grossly. No icterus or injection.   Oropharynx- No ulcer or bleeding, moist mucosa.  Neck - No swelling or tracheal deviation.   Pulmonary/Chest - Normal effort, normal breath sounds.  CV - Normal rate, regular rhythm, normal heart sounds and intact distal pulses.  Abd - ND, soft, Non-TTP. No hepatospenomegaly. Normal bowel sounds. No masses palpable.  Extremities - Warm, dry.   Skin - No exposed rash, lesion.  Neurological - AAOx4, No gross deficit    Labs/Imaging:  Pertient labs/imaging were reviewed on initiation of progress note.  24-hour labs:    Results for orders placed or performed during the hospital encounter of 01/12/17 (from the past 24 hour(s))   CULTURE-BLOOD W/SENSITIVITY    Collection Time: 01/14/17  1:35 PM   Result Value Ref Range    Battery Name BLOOD CULTURE     Specimen Description BLOOD  DIALYSIS CATHER       Special Requests NONE     Culture NO GROWTH 1 DAY     Report Status     CULTURE-CATHETER TIP W/SENSITIVITY    Collection Time: 01/14/17  7:25 PM   Result Value Ref Range    Battery Name CATHETER TIP CULTURE     Specimen Description CATHETER TIP     Special Requests NONE Culture Culture in progress     Report Status     CULTURE-BLOOD W/SENSITIVITY    Collection Time: 01/14/17  8:15 PM   Result Value Ref Range    Battery Name BLOOD CULTURE     Specimen Description BLOOD  R CHEST PORT       Special Requests NONE     Culture NO GROWTH 1 DAY     Report Status     TRANSFUSION REACTION EVALUATION    Collection Time: 01/15/17  1:00 AM   Result Value Ref Range    Clerical Check NO ERRORS DETECTED     Pre Trans Spec Appearance NO VISIBLE HEMOLYSIS     Post Trans Spec Appearance NO VISIBLE HEMOLYSIS     ABO/RH(D) O POS     DAT, [employer] [place] NEG     Hemoglobin on Urine Supernate      Path Interpretation of Reaction      Transfusion Reaction Report      Pathology Signature     CBC AND DIFF    Collection Time: 01/15/17  3:25 AM   Result Value Ref Range    White Blood Cells 9.4 4.5 - 11.0 K/UL    RBC 2.28 (L) 4.0 - 5.0 M/UL  Hemoglobin 6.7 (L) 12.0 - 15.0 GM/DL    Hematocrit 04.5 (L) 36 - 45 %    MCV 90.7 80 - 100 FL    MCH 29.4 26 - 34 PG    MCHC 32.4 32.0 - 36.0 G/DL    RDW 40.9 (H) 11 - 15 %    Platelet Count 153 150 - 400 K/UL    MPV 8.2 7 - 11 FL    Neutrophils 69 41 - 77 %    Lymphocytes 16 (L) 24 - 44 %    Monocytes 15 (H) 4 - 12 %    Eosinophils 0 0 - 5 %    Basophils 0 0 - 2 %    Absolute Neutrophil Count 6.50 1.8 - 7.0 K/UL    Absolute Lymph Count 1.50 1.0 - 4.8 K/UL    Absolute Monocyte Count 1.40 (H) 0 - 0.80 K/UL    Absolute Eosinophil Count 0.00 0 - 0.45 K/UL    Absolute Basophil Count 0.00 0 - 0.20 K/UL   COMPREHENSIVE METABOLIC PANEL    Collection Time: 01/15/17  3:25 AM   Result Value Ref Range    Sodium 134 (L) 137 - 147 MMOL/L    Potassium 3.6 3.5 - 5.1 MMOL/L    Chloride 96 (L) 98 - 110 MMOL/L    Glucose 100 70 - 100 MG/DL    Blood Urea Nitrogen 21 7 - 25 MG/DL    Creatinine 8.11 (H) 0.4 - 1.00 MG/DL    Calcium 7.9 (L) 8.5 - 10.6 MG/DL    Total Protein 7.1 6.0 - 8.0 G/DL    Total Bilirubin 0.3 0.3 - 1.2 MG/DL    Albumin 3.2 (L) 3.5 - 5.0 G/DL Alk Phosphatase 77 25 - 110 U/L    AST (SGOT) 17 7 - 40 U/L    CO2 27 21 - 30 MMOL/L    ALT (SGPT) 9 7 - 56 U/L    Anion Gap 11 3 - 12    eGFR Non African American 7 (L) >60 mL/min    eGFR African American 8 (L) >60 mL/min   MAGNESIUM    Collection Time: 01/15/17  3:25 AM   Result Value Ref Range    Magnesium 1.8 1.6 - 2.6 mg/dL   PHOSPHORUS    Collection Time: 01/15/17  3:25 AM   Result Value Ref Range    Phosphorus 4.7 (H) 2.0 - 4.5 MG/DL   IRON + BINDING CAPACITY + %SAT+ FERRITIN    Collection Time: 01/15/17  3:25 AM   Result Value Ref Range    Iron 21 (L) 50 - 160 MCG/DL    Iron Binding-TIBC 914 (L) 270 - 380 MCG/DL    % Saturation 10 (L) 28 - 42 %    Ferritin 610 (H) 10 - 200 NG/ML   HEMOGLOBIN & HEMATOCRIT    Collection Time: 01/15/17 10:24 AM   Result Value Ref Range    Hemoglobin 6.6 (L) 12.0 - 15.0 GM/DL    Hematocrit 78.2 (L) 36 - 45 %

## 2017-01-15 NOTE — Progress Notes
OCCUPATIONAL THERAPY  OT orders received, chart review completed. Pt with recent LE DVT, INR currently not in therapeutic range, MD requests OT hold off on eval for today. Will f/u with evaluation as able.  Therapist: Kennyth Arnoldynthia Yianni Skilling, OT  Date: 01/15/2017

## 2017-01-15 NOTE — Progress Notes
Text paged MICU about patients morning HgB(6.7). Awaiting orders.

## 2017-01-15 NOTE — Progress Notes
Spoke to Dr. Gar PontoJamin about pt fever and tachycardia. Orders to hold off on the blood for now and to recheck V/S in 1hr.

## 2017-01-15 NOTE — Progress Notes
Discussed with pt results of doppler.     Pt had Hx of PE due to OCP and was treated with coumadin.   Had Hx of PUD s/p EGD 5 y ago, mostly due to NSAID use ( 800 mg ibuprofen 3-4 times intermittently)   Hx multiple transfusions before and no complications.       Discussed benefits risks of different options including filter placements.     Decision was to try to start heparin gtt and monitor Hb, if dropping may need filter.   Pt would like to try transfusion again. Anemia could be from menstruation vs CKD

## 2017-01-15 NOTE — Progress Notes
Pt pre-blood bp was 85/48. All other vitals WNL. Pt asymptomatic and states "she feels fine." RN started blood and after 15 minutes recheck bp was 83/54. Pt again states she feels fine and is asymptomatic with other vitals WNL. RN notified primary team of situation and ordered to continue blood unless pt starts to feel unwell. Rn will continue blood and continue to monitor.

## 2017-01-15 NOTE — Progress Notes
Renal Consult    Debra Lam  Admission Date:  01/12/2017         Assessment and Plan     Active Problems:    ESRD (end stage renal disease) on dialysis (HCC)    Morbid obesity (HCC)    Sepsis (HCC)    Hyperkalemia    Infection due to parainfluenza virus 4    Encephalopathy    Tachycardia    HTN (hypertension)    OSA (obstructive sleep apnea)    Acute respiratory failure with hypoxia (HCC)    Anemia        Debra Lam is a 21 y.o. 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 admitted with sepsis     ESRD on HD  Hx of failed renal transplant in 2008  - ESRD 2/2 congenital/hypoplastic kidneys  - Dialysis schedule: [facility] (recently switched from PD; last PD date 12/2-12/3; first HD session on 01/11/17)  - Dialysis unit: [facility]  - Access: Right tunneled femoral line removed 12/6 ; PD catheter in place  - Dry weight: unknown     Sepsis  - febrile, leukocytosis, and tachycardic on admission   - has port, RLE tunneled HD, and PD catheter    Severe right thigh pain  - CT abd and LE reviewed     History of parathyroidectomy  Hyperphosphatemia  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 No indication for dialysis today - electrolytes and fluid status okay  ??? Will continue to observe off dialysis      Debra Lam  Pager [phone]        History     HPI: Debra Lam is a 21 y.o. female     Pt had femoral catheter removed last night and says her thigh is much less painful today. Tmax was 101, still on Zosyn and linezolid.     Past Medical History   Past Medical History:   Diagnosis Date   ??? Depression    ??? H/O parathyroidectomy    ??? Hypertension    ??? Morbid obesity (HCC) ??? Perirectal abscess    ??? Pica    ??? PUD (peptic ulcer disease)    ??? Pulmonary embolism (HCC)    ??? Renal failure    ??? Sleep apnea        Past Surgical History:   Procedure Laterality Date   ??? KIDNEY TRANSPLANT  2009   ??? CENTRAL VENOUS CATHETER      hemodialysis catheter placement   ??? TUNNELED VENOUS PORT PLACEMENT         Family History  Family History   Problem Relation Age of Onset   ??? Hypertension Mother    ??? None Reported Brother          Social History   Social History     Socioeconomic History   ??? Marital status: Single     Spouse name: Not on file   ??? Number of children: 0   ??? Years of education: 30   ??? Highest education level: Not on file   Social Needs   ???  Financial resource strain: Not on file   ??? Food insecurity - worry: Not on file   ??? Food insecurity - inability: Not on file   ??? Transportation needs - medical: Not on file   ??? Transportation needs - non-medical: Not on file   Occupational History     Comment: unemployed   Tobacco Use   ??? Smoking status: Never Smoker   ??? Smokeless tobacco: Never Used   Substance and Sexual Activity   ??? Alcohol use: No   ??? Drug use: No   ??? Sexual activity: Yes   Other Topics Concern   ??? Military Service Not Asked   ??? Blood Transfusions Yes   ??? Caffeine Concern Not Asked   ??? Occupational Exposure Not Asked   ??? Hobby Hazards Not Asked   ??? Sleep Concern Not Asked   ??? Stress Concern Not Asked   ??? Weight Concern Not Asked   ??? Special Diet Not Asked   ??? Back Care Not Asked   ??? Exercise No   ??? Bike Helmet Not Asked   ??? Seat Belt Not Asked   ??? Self-Exams Not Asked   Social History Narrative   ??? Not on file         Medications  MEDS    calcium acetate 1,334 mg Oral TID w/ meals   cetirizine 5 mg Oral QAM8   heparin (porcine) 20-40 Units/kg Intravenous As Prescribed   lidocaine 1 patch Topical QDAY   linezolid  (ZYVOX) IVPB 600 mg Intravenous Q12H*   pantoprazole 40 mg Intravenous BID   piperacillin/tazobactam  (ZOSYN) IVPB 2.25 g Intravenous Q8H*   senna/docusate 10 mL Oral BID IV MEDS  ??? heparin (porcine) 20,000 units/D5W 500 mL infusion (std conc)(premade) 2,000 Units/hr (01/15/17 1453)     Prn emu PRN, HYDROcodone/acetaminophen Q4H PRN 1 tablet at 01/15/17 1219, ondansetron (ZOFRAN) IV Q6H PRN 4 mg at 01/15/17 1220, tiZANidine Q8H PRN 4 mg at 01/15/17 1219     HOME MEDS  Prior to Admission Medications   Prescriptions Last Dose Informant Patient Reported? Taking?   amLODIPine (NORVASC) 10 mg tablet   Yes No   Sig: Take 10 mg by mouth daily.   calcitriol (ROCALTROL) 0.5 mcg capsule   Yes No   Sig: Take 1 mcg by mouth daily.   calcium acetate (PHOSLO) 667 mg capsule   Yes No   Sig: Take 1,334 mg by mouth three times daily with meals.   carvedilol (COREG) 25 mg tablet   Yes No   Sig: Take 25 mg by mouth twice daily with meals. Take with food.   cetirizine (ZYRTEC) 5 mg tablet   Yes No   Sig: Take 5 mg by mouth every morning.   fosinopril (MONOPRIL) 40 mg tablet   Yes No   Sig: Take 40 mg by mouth daily.   polyethylene glycol 3350 (MIRALAX) 17 g packet   Yes No   Sig: Take 17 g by mouth daily as needed.   potassium chloride SR (K-DUR) 10 mEq tablet   Yes No   Sig: Take 10 mEq by mouth daily. Take with a meal and a full glass of water.      Facility-Administered Medications: None          Review of Systems  12 pt ROS was negative        Physical Exam        Vital Signs: Last Filed In 24 Hours Vital Signs: 24 Hour Range  BP: 83/54 (12/08 1530)  Temp: 36.9 ???C (98.5 ???F) (12/08 1530)  Pulse: 98 (12/08 1530)  Respirations: 20 PER MINUTE (12/08 1530)  SpO2: 99 % (12/08 1530)  O2 Delivery: None (Room Air) (12/08 1530)  SpO2 Pulse: 117 (12/07 1800) BP: (83-150)/(48-102)   Temp:  [36.8 ???C (98.2 ???F)-39.3 ???C (102.7 ???F)]   Pulse:  [83-122]   Respirations:  [17 PER MINUTE-32 PER MINUTE]   SpO2:  [95 %-100 %]   O2 Delivery: None (Room Air)   Intensity Pain Scale (Self Report): 9 (01/15/17 4540)      Vitals:    01/14/17 1335 01/14/17 1800 01/15/17 0600 Weight: 112 kg (246 lb 14.6 oz) 112 kg (246 lb 14.6 oz) 114.8 kg (253 lb 1.4 oz)       Intake/Output Summary (Last 24 hours) at 01/15/2017 1627  Last data filed at 01/15/2017 1512  Gross per 24 hour   Intake 600 ml   Output 1000 ml   Net -400 ml        General:  Alert, cooperative, obese female  Head:  Normocephalic, without obvious abnormality, atraumatic  Eyes:  Conjunctivae/corneas clear.  PERRL, EOMs intact.  Neck:  Supple  Chest: port  Lungs:  CTAB anteriorly   Heart:    RRR; S1, S2 normal; No M/R/G  Abdomen:  Soft, non-tender; PD catheter, site clean and dry  Extremities:  Right thigh moderately tender to touch   Skin:   Dry skin; No rashes or lesions  Neurologic:  CNII - XII grossly intact.       Labs     Recent Labs      01/12/17   2310  01/13/17   0327  01/14/17   0418  01/15/17   0325   NA  131*  134*  130*  134*   K  5.9*  3.4*  4.6  3.6   CL  96*  95*  93*  96*   CO2  22  27  27  27    GAP  13*  12  10  11    BUN  58*  19  41*  21   CR  13.89*  5.10*  10.94*  7.40*   GLU  99  98  104*  100   CA  8.0*  8.8  7.1*  7.9*   ALBUMIN  3.7  3.8  3.3*  3.2*   MG  2.1  1.8  1.9  1.8   PO4  5.4*  2.5  6.6*  4.7*   TSH  0.380   --    --    --        Recent Labs      01/12/17   2310  01/13/17   0327  01/14/17   0418  01/14/17   0930  01/14/17   1035  01/15/17   0325  01/15/17   1024  01/15/17   1336   WBC  15.8*  14.7*  14.1*  12.1*   --   9.4   --    --    HGB  8.2*  8.6*  7.1*  6.8*   --   6.7*  6.6*   --    HCT  25.1*  26.4*  22.0*  21.1*   --   20.7*  20.5*   --    PLTCT  175  172  142*  137*   --   153   --    --    INR  1.1   --    --    --   1.3*   --    --    --    PTT  25.3   --    --    --    --    --    --   24.8   AST  12  14  17    --    --   17   --    --    ALT  7  10  11    --    --   9   --    --    ALKPHOS  115*  120*  89   --    --   77   --    --       Estimated Creatinine Clearance: 14.7 mL/min (A) (based on SCr of 7.4 mg/dL (H)).  Vitals:    01/14/17 1335 01/14/17 1800 01/15/17 0600 Weight: 112 kg (246 lb 14.6 oz) 112 kg (246 lb 14.6 oz) 114.8 kg (253 lb 1.4 oz)    No results for input(s): PHART, PO2ART in the last 72 hours.    Invalid input(s): PC02A                        Radiology     Pertinent radiology reviewed.

## 2017-01-15 NOTE — Progress Notes
Patient arrived on unit via bed accompanied by transport. Patient transferred to the bed with assistance. Assessment completed, refer to flowsheet for details. Orders released, reviewed, and implemented as appropriate. Oriented to surroundings, call light within reach. Plan of care reviewed.  Will continue to monitor and assess.

## 2017-01-16 ENCOUNTER — Encounter: Admit: 2017-01-16 | Discharge: 2017-01-16 | Payer: MEDICARE

## 2017-01-16 DIAGNOSIS — D508 Other iron deficiency anemias: ICD-10-CM

## 2017-01-16 DIAGNOSIS — Z8711 Personal history of peptic ulcer disease: Principal | ICD-10-CM

## 2017-01-16 LAB — MAGNESIUM: Lab: 1.9 mg/dL — ABNORMAL LOW (ref 1.6–2.6)

## 2017-01-16 LAB — CULTURE-WOUND/TISSUE/FLUID(AEROBIC ONLY)W/SENSITIVITY

## 2017-01-16 LAB — PTT (APTT)
Lab: 81 s — ABNORMAL HIGH (ref 24.0–36.5)
Lab: 65 s — ABNORMAL HIGH (ref 24.0–36.5)
Lab: 73 s — ABNORMAL HIGH (ref 24.0–36.5)

## 2017-01-16 LAB — CULTURE-CATHETER TIP W/SENSITIVITY
Lab: >15
Lab: >15 — AB

## 2017-01-16 LAB — THYROID STIMULATING HORMONE-TSH: Lab: 1 uU/mL — ABNORMAL LOW (ref >60)

## 2017-01-16 LAB — HEMOGLOBIN & HEMATOCRIT
Lab: 23 % — ABNORMAL LOW (ref 36–45)
Lab: 7.6 g/dL — ABNORMAL LOW (ref >60)
Lab: 7.1 g/dL — ABNORMAL LOW (ref 12.0–15.0)
Lab: 7 g/dL — ABNORMAL LOW (ref >60)
Lab: 7.7 g/dL — ABNORMAL LOW (ref 12.0–15.0)

## 2017-01-16 LAB — CBC AND DIFF: Lab: 7.4 K/UL — ABNORMAL LOW (ref 4.5–11.0)

## 2017-01-16 LAB — CREATINE KINASE-CPK: Lab: 110 U/L — ABNORMAL LOW (ref 21–215)

## 2017-01-16 LAB — COMPREHENSIVE METABOLIC PANEL: Lab: 134 MMOL/L — ABNORMAL LOW (ref 137–147)

## 2017-01-16 LAB — PHOSPHORUS: Lab: 7 mg/dL — ABNORMAL HIGH (ref >60)

## 2017-01-16 MED ORDER — IRON SUCROSE 100 MG IRON/5 ML IV SOLN
100 mg | INTRAVENOUS | 0 refills | Status: CP
Start: 2017-01-16 — End: ?
  Administered 2017-01-17 – 2017-01-21 (×5): 100 mg via INTRAVENOUS

## 2017-01-16 MED ORDER — MELATONIN 3 MG PO TAB
3 mg | Freq: Once | ORAL | 0 refills | Status: CP
Start: 2017-01-16 — End: ?
  Administered 2017-01-17: 04:00:00 3 mg via ORAL

## 2017-01-16 MED ORDER — SEVELAMER CARBONATE 0.8 GRAM PO PWPK
800 mg | Freq: Three times a day (TID) | NASOGASTRIC | 0 refills | Status: DC
Start: 2017-01-16 — End: 2017-01-17
  Administered 2017-01-17: 800 mg via NASOGASTRIC

## 2017-01-16 MED ORDER — SODIUM CHLORIDE 0.9 % IV SOLP
INTRAVENOUS | 0 refills | Status: CN
Start: 2017-01-16 — End: ?

## 2017-01-16 MED ORDER — IRON SUCROSE 100 MG IRON/5 ML IV SOLN
100 mg | Freq: Once | INTRAVENOUS | 0 refills | Status: CP
Start: 2017-01-16 — End: ?
  Administered 2017-01-16: 23:00:00 100 mg via INTRAVENOUS

## 2017-01-16 NOTE — Progress Notes
OCCUPATIONAL THERAPY  Per discussion with RN, patient is up ad lib in room,  no concerns with completing activities of daily living with no OT goals identified. OT will discontinue service, please re-consult if the patient has a decline in functional status.     Therapist: Kennyth Arnoldynthia Maniyah Moller, OT  Date: 01/16/2017

## 2017-01-16 NOTE — Progress Notes
Inpatient Progress Note        Name:  Debra Lam   Medical Record Number: [id number]          Account Number:  [id number]  Date Of Birth:  [birth year]                           Room and Bed Number -  BH4605/01  Today's Date:  01/16/2017  LOS: 4 days    Age: 21 years  female    Chief Complaint:  on 01/12/2017    Admitting diagnosis: CLABSI (central line-associated bloodstream infection)    Assessment     Principal Problem:    CLABSI (central line-associated bloodstream infection)  Active Problems:    ESRD (end stage renal disease) on dialysis (HCC)    Morbid obesity (HCC)    Sepsis (HCC)    Hyperkalemia    Infection due to parainfluenza virus 4    Encephalopathy    Tachycardia    HTN (hypertension)    OSA (obstructive sleep apnea)    Acute respiratory failure with hypoxia (HCC)    Anemia    Acute deep vein thrombosis (DVT) of femoral vein of right lower extremity (HCC)    Debra Lam???is a 21 y.o.???female???with h/o   - HTN,   - 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     who presented as a transfer from OSH for worsening renal failure and R thigh pain. To ICU for urgent HD and management. On???zosyn/zyvox???for sepsis, blood cx + for Gm + cocci. RVP + for parainfluenza. Renal consulted, ID and  IR consulted.     # Sepsis: resolving  - due to infected femoral line first used for hemodialysis on 01/11/17   - cath tip Dec 7: staph Aureus   - [facility] Dec 5 from HD cath is positive for [facility] (pending)  - on zyvox and zosyn  - f/u cx     # Acute DVT   - most likely due to infected line   - started on heparin on Dec 8   -  Discussed benefits risks of different options including filter placements. Decision was to try to start heparin gtt and monitor Hb, if dropping may need filter.     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   -  Had second transfusion on Dec 8  - had third transfusion on Dec 9.   - Anemia could be from menstruation vs CKD  - plan for EGD on Monday as pt had Hx of PUD due to NSAID  - PPI IV   - will hold heparin 4 hours prior to EGD.   - will get records from [facility].     # HTN   - hold anti HTN w sepsis     # ESRD   - eval for PD is per NEphro     Total floor/unit time (reviewing and writing notes, examining the patient, reviewing test results etc) spent was 35 minutes of which > 50% was spent in care coordination  and bedside counseling (explaining treatment options, disease processes, laboratory/imaging results, prognosis, risks and benefits of treatment options,  medication side effects, importance of compliance with treatment, risk factor reduction, follow up with primary care physician).    This note  was created using  [employer], hence  some grammatical errors may still be present despite  editing at the time of the dictation, )    Plan:      ???  expected discharge on  Dec 14?   ADVANCE DIET AS TOLERATED  DIET RENAL DIALYSIS PATIENTS  ??? DIET NPO AT MIDNIGHT  ???  records from previous encounters have been reviewed .  ???  results, diagnoses, prognoses were reviewed with the patients    ??? Radiology, imaging, labs were reviewed and summarized as above.    ??? Benefits / risks were explained in details including bleeding from heparin and death.   ??? pt was educated, informed about the importance of compliance and instructed to follow up with the PCP, Nephro, GI   ??? Will be discussed with RN, pharmacy, and SW in multidisciplinary round    ???  PT/ OT       Future Appointments   Date Time Provider Department Center   01/24/2017  8:15 AM Reubin Milan, MD [facility] [place]   01/24/2017  9:30 AM [place] ECHO 2 [facility] [place]   02/09/2017 12:00 PM Cecilie Lowers, PhD NEUROPS None   02/24/2017  8:00 AM SURGERY VASCULAR LAB [facility] 02/24/2017  9:30 AM Gladstone Lighter, MD [facility]       Subjective:      Pt seen and Chart reviewed.   Had transfusion with no complication overnight. Wt stable. Phosphate is elevated and started on sevelamer. Pt reported blood in stool. RN overnight did not think it was bloody.     ROS:      A comprehensive 10-point ROS was negative except for  Improving. R thigh pain.     Medications:    Scheduled Meds:    calcium acetate (PHOSLO) capsule 1,334 mg 1,334 mg Oral TID w/ meals   cetirizine (ZYRTEC) tablet 5 mg 5 mg Oral QAM8   heparin (porcine) injection 2,300-4,590 Units 20-40 Units/kg Intravenous As Prescribed   lidocaine (LIDODERM) 5 % topical patch 1 patch 1 patch Topical QDAY   linezolid  (ZYVOX)  600 mg/D5W 300 mL IVPB 600 mg Intravenous Q12H*   pantoprazole (PROTONIX) injection 40 mg 40 mg Intravenous BID   piperacillin/tazobactam  (ZOSYN) 2.25 g/50 mL iso-osmotic IVPB 2.25 g Intravenous Q8H*   senna/docusate (SENOKOT-S) solution 10 mL 10 mL Oral BID   sevelamer carbonate (RENVELA) packet 800 mg 800 mg Per NG tube TID w/ meals   Continuous Infusions:  ??? heparin (porcine) 20,000 units/D5W 500 mL infusion (std conc)(premade) 2,100 Units/hr (01/16/17 0859)     PRN and Respiratory Meds:emu PRN, HYDROcodone/acetaminophen Q4H PRN, ondansetron (ZOFRAN) IV Q6H PRN, tiZANidine Q8H PRN      Objective:                            Vital Signs: Last Filed                 Vital Signs: 24 Hour Range   BP: 144/95 (12/09 1119)  Temp: 36.8 ???C (98.3 ???F) (12/09 1119)  Pulse: 105 (12/09 1119)  Respirations: 18 PER MINUTE (12/09 1119)  SpO2: 98 % (12/09 1119)  O2 Delivery: None (Room Air) (12/09 1119) BP: (83-183)/(48-102)   Temp:  [36.3 ???C (97.3 ???F)-37.3 ???C (99.1 ???F)]  Pulse:  [53-106]   Respirations:  [16 PER MINUTE-20 PER MINUTE]   SpO2:  [93 %-100 %]   O2 Delivery: None (Room Air)   Intensity Pain Scale (Self Report): 0 (01/16/17 0400) Vitals:    01/14/17 1335 01/14/17 1800 01/15/17 0600 Weight: 112 kg (246 lb 14.6 oz) 112 kg (246 lb 14.6 oz) 114.8 kg (253 lb 1.4 oz)         Intake/Output Summary:  (Last 24 hours)    Intake/Output Summary (Last 24 hours) at 01/16/2017 1147  Last data filed at 01/16/2017 1610  Gross per 24 hour   Intake 300 ml   Output 0 ml   Net 300 ml      Stool Occurrence: 0    Physical Exam    General: Patient seems to be clinically stable and in no immediate distress. AAO x3   HEENT: NC/AT  Heart: S1 S2 present, RRR, no audible rubs or murmurs heard  Lungs: Bilateral air entry noted, normal breath sounds and no additional sounds heard  Abdomen: Soft, non tender and no masses noted. Bowel sounds were normal. PD in place.   Extremities: no pedal edema; no calf tenderness. Improvement in Tenderness over the R thigh.   Skin: No obvious skin rashes   Neurologic: Seems oriented. No obvious abnormality of cranial nerves, motor system noted      Labs reviwed, Notable for:        Recent Labs      01/14/17   0418  01/15/17   0325  01/16/17   0408   NA  130*  134*  134*   K  4.6  3.6  3.7   CL  93*  96*  96*   CO2  27  27  25    GAP  10  11  13*   BUN  41*  21  32*   CR  10.94*  7.40*  9.35*   GLU  104*  100  110*   CA  7.1*  7.9*  6.5*   ALBUMIN  3.3*  3.2*  3.1*   MG  1.9  1.8  1.9   PO4  6.6*  4.7*  7.0*   TSH   --    --   1.050       Recent Labs      01/14/17   0418  01/14/17   0930  01/14/17   1035  01/15/17   0325  01/15/17   1024  01/15/17   1336  01/15/17   1855  01/15/17   2100  01/16/17   0005  01/16/17   0408  01/16/17   0606   WBC  14.1*  12.1*   --   9.4   --    --    --    --    --   7.4   --    HGB  7.1*  6.8*   --   6.7*  6.6*   --   7.6*   --   7.1*  7.1*  7.0*   HCT  22.0*  21.1*   --   20.7*  20.5*   --   23.2*   --   22.3*  22.2*  21.7*   PLTCT  142*  137*   --   153   --    --    --    --    --   169   --    INR   --    --  1.3*   --    --    --    --    --    --    --    --    PTT   --    --    --    --    --   24.8   --   81.9*   --    --   65.0* AST  17   --    --   17   --    --    --    --    --   16   --    ALT  11   --    --   9   --    --    --    --    --   10   --    ALKPHOS  89   --    --   77   --    --    --    --    --   68   --       Estimated Creatinine Clearance: 11.6 mL/min (A) (based on SCr of 9.35 mg/dL (H)).  Vitals:    01/14/17 1335 01/14/17 1800 01/15/17 0600   Weight: 112 kg (246 lb 14.6 oz) 112 kg (246 lb 14.6 oz) 114.8 kg (253 lb 1.4 oz)    No results for input(s): PHART, PO2ART in the last 72 hours.    Invalid input(s): PC02A    BNP:   Recent Labs      01/13/17   1658   BNP  49.0     CARDIAC ENZYMES: No results for input(s): CKMB in the last 72 hours.    Glucose: (!) 110 (01/16/17 0408)       Microbiology - Resulted Micro Last 72 Hrs      CULTURE-CATHETER TIP W/SENSITIVITY (Abnormal)  Resulted: 01/16/17 1610, Result status: Final result   Ordering provider:  Barron Alvine, APRN  01/14/17 1722 Resulting lab:  [place] [facility]    Specimen Information    Source Collected On   Catheter Tip 01/14/17 1925          Components    Component Value Flag   Battery Name CATHETER TIP CULTURE  ???   Specimen Description CATHETER TIP  ???   Special Requests NONE  ???   Culture --  A   Result:       >15 CFU  STAPHYLOCOCCUS AUREUS     Report Status --  ???   Result:       FINAL  01/16/2017     Organism ID --  ???   Result:       >15 CFU  STAPHYLOCOCCUS AUREUS            Sensitivities     Organism Antibiotic Sensitivity    >15 cfu staphylococcus aureus Clindamycin <=0.5 SUSCEPTIBLE Susceptible    MIC (MCG/ML) INTERPRETATION      >15 cfu staphylococcus aureus Erythromycin >4 RESISTANT Resistant    MIC (MCG/ML) INTERPRETATION      >15 cfu staphylococcus aureus Oxacillin 1 SUSCEPTIBLE Susceptible    MIC (MCG/ML) INTERPRETATION      >15 cfu staphylococcus aureus Vancomycin 1 SUSCEPTIBLE Susceptible    MIC (MCG/ML) INTERPRETATION      >15 cfu staphylococcus aureus Tetracycline <=0.5 SUSCEPTIBLE Susceptible    MIC (MCG/ML) INTERPRETATION >15 cfu staphylococcus aureus Trimethsulfa <=1/19 SUSCEPTIBLE Susceptible  MIC (MCG/ML) INTERPRETATION      >15 cfu staphylococcus aureus Method MIC (MCG/ML) INTERPRETATION     MIC (MCG/ML) INTERPRETATION              CULTURE-WOUND/TISSUE/FLUID(AEROBIC ONLY)W/SENSITIVITY  Resulted: 01/16/17 0820, Result status: Final result   Ordering provider:  Barron Alvine, APRN  01/13/17 1722 Resulting lab:  [place] [facility]    Specimen Information    Source Collected On   Swab 01/13/17 1731          Components    Component Value Flag   Battery Name ROUTINE CULTURE  ???   Specimen Description --  ???   Result:       SWAB  RIGHT  GROIN PUNCTURE     Special Requests NONE  ???   Direct Gram Stain --  ???   Result:       NO NEUTROPHILS SEEN  FEW  RBC'S  RARE  GRAM POSITIVE COCCI RESEMBLING STAPHYLOCOCCI     Culture --  ???   Result:       Light growth  STREPTOCOCCUS AGALACTIAE(GROUP B)  Light growth  CORYNEBACTERIUM SPECIES  Light growth  STAPHYLOCOCCUS, COAGULASE NEGATIVE     Report Status --  ???   Result:       FINAL  01/16/2017              CULTURE-BLOOD W/SENSITIVITY  Resulted: 01/16/17 0256, Result status: Preliminary result   Ordering provider:  Dionne Ano, MD  01/13/17 2102 Resulting lab:  [place] [facility]    Specimen Information    Source Collected On   Blood 01/13/17 2230          Components    Component Value Flag   Battery Name BLOOD CULTURE  ???   Specimen Description --  ???   Result:       BLOOD  LEFT  ANTECUBITAL     Special Requests NONE  ???   Culture NO GROWTH 2 DAYS  ???   Report Status --  ???            CULTURE-BLOOD W/SENSITIVITY  Resulted: 01/16/17 0256, Result status: Preliminary result   Ordering provider:  Dionne Ano, MD  01/13/17 2102 Resulting lab:  [place] [facility]    Specimen Information    Source Collected On   Blood 01/13/17 2315          Components    Component Value Flag   Battery Name BLOOD CULTURE  ???   Specimen Description --  ???   Result:       BLOOD  RIGHT  SC PORT     Special Requests NONE  ??? Culture NO GROWTH 2 DAYS  ???   Report Status --  ???            CULTURE-BLOOD W/SENSITIVITY  Resulted: 01/16/17 0256, Result status: Preliminary result   Ordering provider:  Barron Alvine, APRN  01/14/17 0716 Resulting lab:  [place] [facility] LAB    Specimen Information    Source Collected On   Blood 01/14/17 1335          Components    Component Value Flag   Battery Name BLOOD CULTURE  ???   Specimen Description --  ???   Result:       BLOOD  DIALYSIS CATHER     Special Requests NONE  ???   Culture NO GROWTH 2 DAYS  ???   Report Status --  ???  CULTURE-BLOOD W/SENSITIVITY  Resulted: 01/16/17 0256, Result status: Preliminary result   Ordering provider:  Barron Alvine, APRN  01/14/17 1616 Resulting lab:  [place] MA[facility] LAB    Specimen Information    Source Collected On   Blood 01/14/17 2015          Components    Component Value Flag   Battery Name BLOOD CULTURE  ???   Specimen Description --  ???   Result:       BLOOD  R CHEST PORT     Special Requests NONE  ???   Culture NO GROWTH 2 DAYS  ???   Report Status --  ???            CULTURE-BLOOD W/SENSITIVITY  Resulted: 01/15/17 2244, Result status: Preliminary result   Ordering provider:  Adrian Prince, DO  01/12/17 2257 Resulting lab:  [place] [facility] LAB    Specimen Information    Source Collected On   Blood 01/13/17 0917          Components    Component Value Flag   Battery Name BLOOD CULTURE  ???   Specimen Description --  ???   Result:       BLOOD  RIGHT  CHEST   PORT     Special Requests NONE  ???   Culture --  ???   Result:       POSITIVE SMEAR:  GRAM POSITIVE COCCI RESEMBLING STREPTOCOCCI  one bottle only  CRITICAL VALUE CALLED TO AND READ BACK BY/TIME/TECH  Tuyen K/ 1548 12.7.18/ND  STREPTOCOCCUS AGALACTIAE(GROUP B)  Susceptibility testing of beta lactams is not necessary for clinical purposes   since resistant strains have not been recognized  See previous susceptibility     Report Status --  ???            CULTURE-BLOOD W/SENSITIVITY (Abnormal)  Resulted: 01/14/17 2216, Result status: Preliminary result   Ordering provider:  Adrian Prince, DO  01/12/17 2257 Resulting lab:  [place] MAIN LAB    Specimen Information    Source Collected On   Blood 01/12/17 2310          Components    Component Value Flag   Battery Name BLOOD CULTURE  ???   Specimen Description --  ???   Result:       BLOOD  RIGHT  HD CATH     Special Requests NONE  ???   Culture --  A   Result:       POSITIVE SMEAR:  GRAM POSITIVE COCCI RESEMBLING STREPTOCOCCI  one bottle only  CRITICAL VALUE CALLED TO AND READ BACK BY/TIME/TECH  MAKENZIE S/1736 12.6.18/MC  STREPTOCOCCUS AGALACTIAE(GROUP B)  Susceptibility testing of beta lactams is not necessary for clinical purposes   since resistant strains have not been recognized     Report Status --  ???   Organism ID --  ???   Result:       STREPTOCOCCUS AGALACTIAE(GROUP B)  Susceptibility testing of beta lactams is not necessary for clinical purposes   since resistant strains have not been recognized            Sensitivities     Organism Antibiotic Sensitivity    Streptococcus agalactiae(group b) susceptibility testing of beta lactams is not necessary for clinical purposes  since resistant strains have not been recognized Levofloxacin SUSCEPTIBLE Susceptible    KIRBY BAUER      Streptococcus agalactiae(group b) susceptibility testing of beta lactams is not necessary for clinical purposes  since  resistant strains have not been recognized [facility] Bronwen Betters STAIN  Resulted: 01/13/17 2105, Result status: Final result   Ordering provider:  Barron Alvine, APRN  01/13/17 1731 Resulting lab:  [place] MAIN LAB    Specimen Information    Source Collected On   Swab 01/13/17 1731          Components    Component Value Flag   Battery Name GRAM STAIN   ???   Specimen Description --  ???   Result:       SWAB  RIGHT  GROIN PUNCTURE     Special Requests NONE  ???   Gram Stain --  ???   Result:       NO NEUTROPHILS SEEN  FEW  RBC'S  RARE  GRAM POSITIVE COCCI RESEMBLING STAPHYLOCOCCI Report Status --  ???   Result:       FINAL  01/13/2017

## 2017-01-16 NOTE — Progress Notes
Assumed care at 1900. Assessments completed and documented. Orders, labs, MAR reviewed. No acute changes throughout the night. VSS     Mentation: A&Ox4; calm & cooperative; follows commands appropriately.     Cardiac: S1; S2; palpable pulses; Tele status:SR     Respiratory: patient's breathing is non-labored and symmetrical; clear in apices and decreased in bases.     GI/GU: voids, last BM: 01/15/17    Nutrition: patient is on a renal dialysis diet, tolerates.     Activity: up ad lib     Pain: patient did  c/o pain through out the night in abdomen .    Will continue to monitor and pass along to oncoming RN.

## 2017-01-16 NOTE — Progress Notes
Renal Consult    Debra Lam  Admission Date:  01/12/2017         Assessment and Plan     Principal Problem:    CLABSI (central line-associated bloodstream infection)  Active Problems:    ESRD (end stage renal disease) on dialysis (HCC)    Morbid obesity (HCC)    Sepsis (HCC)    Hyperkalemia    Infection due to parainfluenza virus 4    Encephalopathy    Tachycardia    HTN (hypertension)    OSA (obstructive sleep apnea)    Acute respiratory failure with hypoxia (HCC)    Anemia    Acute deep vein thrombosis (DVT) of femoral vein of right lower extremity (HCC)        Debra Lam is a 21 y.o. 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 Resolving line sepsis  ??? No indication for dialysis today - electrolytes and fluid status okay  ??? Plan to restart PD tomorrow ??? She can be discharged on PD if she needs to be line-free for a prolonged duration  ??? IV iron (Venofer 100 mg should be safe in setting of treated infection)      Francena Hanly  Pager [phone]        History     HPI: Debra Lam is a 21 y.o. female     Continued improvement in thigh pain. WBC down to 7.4. Catheter tip grew >15 colonies of S. aureus. She denies N/V/SOB and is eating. I/O 300/0. Lytes okay with Cr up to 9.35, K 3.7 and phos up to 7.    Past Medical History   Past Medical History:   Diagnosis Date   ??? Depression    ??? H/O parathyroidectomy    ??? Hypertension    ??? Morbid obesity (HCC)    ??? Perirectal abscess    ??? Pica    ??? PUD (peptic ulcer disease)    ??? Pulmonary embolism (HCC)    ??? Renal failure    ??? Sleep apnea        Past Surgical History:   Procedure Laterality Date   ??? KIDNEY TRANSPLANT  2009   ??? CENTRAL VENOUS CATHETER  hemodialysis catheter placement   ??? TUNNELED VENOUS PORT PLACEMENT         Family History  Family History   Problem Relation Age of Onset   ??? Hypertension Mother    ??? None Reported Brother          Social History   Social History     Socioeconomic History   ??? Marital status: Single     Spouse name: Not on file   ??? Number of children: 0   ??? Years of education: 38   ??? Highest education level: Not on file   Social Needs   ??? Financial resource strain: Not on file   ??? Food insecurity - worry: Not on file   ??? Food insecurity - inability: Not on file   ??? Transportation needs - medical: Not on file   ??? Transportation needs - non-medical: Not on file   Occupational History     Comment: unemployed   Tobacco Use   ??? Smoking status: Never Smoker   ??? Smokeless tobacco: Never Used   Substance and Sexual Activity   ??? Alcohol use: No   ??? Drug use: No   ??? Sexual activity: Yes   Other Topics Concern   ??? Military Service Not Asked   ??? Blood Transfusions Yes   ??? Caffeine Concern Not Asked   ??? Occupational Exposure Not Asked   ??? Hobby Hazards Not Asked   ??? Sleep Concern Not Asked ??? Stress Concern Not Asked   ??? Weight Concern Not Asked   ??? Special Diet Not Asked   ??? Back Care Not Asked   ??? Exercise No   ??? Bike Helmet Not Asked   ??? Seat Belt Not Asked   ??? Self-Exams Not Asked   Social History Narrative   ??? Not on file         Medications  MEDS    calcium acetate 1,334 mg Oral TID w/ meals   cetirizine 5 mg Oral QAM8   heparin (porcine) 20-40 Units/kg Intravenous As Prescribed   lidocaine 1 patch Topical QDAY   linezolid  (ZYVOX) IVPB 600 mg Intravenous Q12H*   pantoprazole 40 mg Intravenous BID   piperacillin/tazobactam  (ZOSYN) IVPB 2.25 g Intravenous Q8H*   senna/docusate 10 mL Oral BID   sevelamer carbonate 800 mg Per NG tube TID w/ meals    IV MEDS  ??? heparin (porcine) 20,000 units/D5W 500 mL infusion (std conc)(premade) 2,200 Units/hr (01/16/17 1348)     Prn emu PRN, HYDROcodone/acetaminophen Q4H PRN 1 tablet at 01/16/17 1320, ondansetron (ZOFRAN) IV Q6H PRN 4 mg at 01/16/17 0131, tiZANidine Q8H PRN 4 mg at 01/16/17 1320     HOME MEDS  Prior to Admission Medications   Prescriptions Last Dose Informant Patient Reported? Taking?   amLODIPine (NORVASC) 10 mg tablet   Yes No   Sig: Take 10 mg by mouth daily.   calcitriol (ROCALTROL) 0.5 mcg capsule   Yes No   Sig: Take 1 mcg by mouth daily.   calcium acetate (PHOSLO) 667 mg capsule   Yes No   Sig: Take 1,334 mg by mouth three times daily with meals.   carvedilol (COREG) 25 mg tablet   Yes No   Sig: Take 25 mg by mouth twice daily with meals. Take with food.   cetirizine (ZYRTEC) 5 mg tablet   Yes No   Sig: Take 5 mg by mouth every morning.   fosinopril (MONOPRIL) 40 mg tablet   Yes  No   Sig: Take 40 mg by mouth daily.   polyethylene glycol 3350 (MIRALAX) 17 g packet   Yes No   Sig: Take 17 g by mouth daily as needed.   potassium chloride SR (K-DUR) 10 mEq tablet   Yes No   Sig: Take 10 mEq by mouth daily. Take with a meal and a full glass of water.      Facility-Administered Medications: None          Review of Systems 12 pt ROS was negative        Physical Exam        Vital Signs: Last Filed In 24 Hours Vital Signs: 24 Hour Range   BP: 144/95 (12/09 1119)  Temp: 36.8 ???C (98.3 ???F) (12/09 1119)  Pulse: 105 (12/09 1119)  Respirations: 18 PER MINUTE (12/09 1119)  SpO2: 98 % (12/09 1119)  O2 Delivery: None (Room Air) (12/09 1119) BP: (93-183)/(49-95)   Temp:  [36.3 ???C (97.3 ???F)-37.1 ???C (98.7 ???F)]   Pulse:  [53-105]   Respirations:  [16 PER MINUTE-20 PER MINUTE]   SpO2:  [93 %-100 %]   O2 Delivery: None (Room Air)   Intensity Pain Scale (Self Report): 8 (01/16/17 1320)      Vitals:    01/14/17 1335 01/14/17 1800 01/15/17 0600   Weight: 112 kg (246 lb 14.6 oz) 112 kg (246 lb 14.6 oz) 114.8 kg (253 lb 1.4 oz)       Intake/Output Summary (Last 24 hours) at 01/16/2017 1541  Last data filed at 01/16/2017 1610  Gross per 24 hour   Intake 0 ml   Output 0 ml   Net 0 ml        General:  Alert, cooperative, obese female  Head:  Normocephalic, without obvious abnormality, atraumatic  Eyes:  Conjunctivae/corneas clear.  PERRL, EOMs intact.  Neck:  Supple  Chest: port  Lungs:  CTAB anteriorly   Heart:    RRR; S1, S2 normal; No M/R/G  Abdomen:  Soft, non-tender; PD catheter, site clean and dry  Extremities:  Right thigh minimally tender to touch   Skin:   Dry skin; No rashes or lesions  Neurologic:  CNII - XII grossly intact.       Labs     Recent Labs      01/14/17   0418  01/15/17   0325  01/16/17   0408   NA  130*  134*  134*   K  4.6  3.6  3.7   CL  93*  96*  96*   CO2  27  27  25    GAP  10  11  13*   BUN  41*  21  32*   CR  10.94*  7.40*  9.35*   GLU  104*  100  110*   CA  7.1*  7.9*  6.5*   ALBUMIN  3.3*  3.2*  3.1*   MG  1.9  1.8  1.9   PO4  6.6*  4.7*  7.0*   TSH   --    --   1.050       Recent Labs      01/14/17   0418  01/14/17   0930  01/14/17   1035  01/15/17   0325  01/15/17   1024  01/15/17   1336  01/15/17   1855  01/15/17   2100  01/16/17   0005  01/16/17   0408  01/16/17   9604  01/16/17   1300 WBC  14.1*  12.1*   --   9.4   --    --    --    --    --   7.4   --    --    HGB  7.1*  6.8*   --   6.7*  6.6*   --   7.6*   --   7.1*  7.1*  7.0*  7.7*   HCT  22.0*  21.1*   --   20.7*  20.5*   --   23.2*   --   22.3*  22.2*  21.7*  23.7*   PLTCT  142*  137*   --   153   --    --    --    --    --   169   --    --    INR   --    --   1.3*   --    --    --    --    --    --    --    --    --    PTT   --    --    --    --    --   24.8   --   81.9*   --    --   65.0*  73.1*   AST  17   --    --   17   --    --    --    --    --   16   --    --    ALT  11   --    --   9   --    --    --    --    --   10   --    --    ALKPHOS  89   --    --   77   --    --    --    --    --   68   --    --       Estimated Creatinine Clearance: 11.6 mL/min (A) (based on SCr of 9.35 mg/dL (H)).  Vitals:    01/14/17 1335 01/14/17 1800 01/15/17 0600   Weight: 112 kg (246 lb 14.6 oz) 112 kg (246 lb 14.6 oz) 114.8 kg (253 lb 1.4 oz)    No results for input(s): PHART, PO2ART in the last 72 hours.    Invalid input(s): PC02A                        Radiology     Pertinent radiology reviewed.

## 2017-01-16 NOTE — Progress Notes
Inpatient Progress Note        Name:  Debra Lam   Medical Record Number: [id number]          Account Number:  [id number]  Date Of Birth:  [birth year]                           Room and Bed Number -  BH4605/01  Today's Date:  01/15/2017  LOS: 3 days    Age: 21 years  female    Chief Complaint:  on 01/12/2017    Admitting diagnosis: CLABSI (central line-associated bloodstream infection)    Assessment     Principal Problem:    CLABSI (central line-associated bloodstream infection)  Active Problems:    ESRD (end stage renal disease) on dialysis (HCC)    Morbid obesity (HCC)    Sepsis (HCC)    Hyperkalemia    Infection due to parainfluenza virus 4    Encephalopathy    Tachycardia    HTN (hypertension)    OSA (obstructive sleep apnea)    Acute respiratory failure with hypoxia (HCC)    Anemia    Acute deep vein thrombosis (DVT) of femoral vein of right lower extremity (HCC)    Debra Lam???is a 21 y.o.???female???with h/o   - HTN,   - 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     who presented as a transfer from OSH for worsening renal failure and R thigh pain. To ICU for urgent HD and management. On???zosyn/zyvox???for sepsis, blood cx + for Gm + cocci. RVP + for parainfluenza. Renal consulted, ID and  IR consulted.     # Sepsis   - due to infected femoral line first used for hemodialysis on 01/11/17   - cath tip Dec 7: staph Aureus   - [facility] Dec 5 from HD cath is positive for GPC (pending)  - on zyvox and zosyn  - f/u cx     # Acute DVT   - most likely due to infected line   - started on heparin on Dec 8   -  Discussed benefits risks of different options including filter placements. Decision was to try to start heparin gtt and monitor Hb, if dropping may need filter.     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   -  Pt would like to try transfusion again. Anemia could be from menstruation vs CKD  - plan for EGD on Monday as pt had Hx of PUD due to NSAID  - PPI IV     # HTN   - hold anti HTN w sepsis     # ESRD   - eval for PD is per NEphro     This note  was created using  [employer], hence  some grammatical errors may still be present despite  editing at the time of the dictation, )    Plan:      ???  expected discharge on  Dec 14?   ADVANCE DIET AS TOLERATED  ??? DIET RENAL DIALYSIS PATIENTS  ???  records from previous encounters have been reviewed .  ???  results,  diagnoses, prognoses were reviewed with the patients    ??? Radiology, imaging, labs were reviewed and summarized as above.    ??? Benefits / risks were explained in details including bleeding from heparin and death.   ??? pt was educated, informed about the importance of compliance and instructed to follow up with the PCP, Nephro, GI   ??? Will be discussed with RN, pharmacy, and SW in multidisciplinary round    ???  PT/ OT       Future Appointments   Date Time Provider Department Center   01/24/2017  8:15 AM Reubin Milan, MD [facility] [place]   01/24/2017  9:30 AM Nunez ECHO 2 [facility] [place]   02/09/2017 12:00 PM Cecilie Lowers, PhD NEUROPS None   02/24/2017  8:00 AM SUR[facility]ULAR [facility]   02/24/2017  9:30 AM Gladstone Lighter, MD [facility]       Subjective:      Pt seen and Chart reviewed.   Was complaining of R thigh pain overnight.     ROS:      A comprehensive 10-point ROS was negative except for  R thigh pain.     Medications:    Scheduled Meds:    calcium acetate (PHOSLO) capsule 1,334 mg 1,334 mg Oral TID w/ meals   cetirizine (ZYRTEC) tablet 5 mg 5 mg Oral QAM8   heparin (porcine) injection 2,300-4,590 Units 20-40 Units/kg Intravenous As Prescribed   lidocaine (LIDODERM) 5 % topical patch 1 patch 1 patch Topical QDAY linezolid  (ZYVOX)  600 mg/D5W 300 mL IVPB 600 mg Intravenous Q12H*   pantoprazole (PROTONIX) injection 40 mg 40 mg Intravenous BID   piperacillin/tazobactam  (ZOSYN) 2.25 g/50 mL iso-osmotic IVPB 2.25 g Intravenous Q8H*   senna/docusate (SENOKOT-S) solution 10 mL 10 mL Oral BID   Continuous Infusions:  ??? heparin (porcine) 20,000 units/D5W 500 mL infusion (std conc)(premade) 2,000 Units/hr (01/15/17 1453)     PRN and Respiratory Meds:emu PRN, HYDROcodone/acetaminophen Q4H PRN, ondansetron (ZOFRAN) IV Q6H PRN, tiZANidine Q8H PRN      Objective:                            Vital Signs: Last Filed                 Vital Signs: 24 Hour Range   BP: 93/65 (12/08 1840)  Temp: 36.7 ???C (98.1 ???F) (12/08 1840)  Pulse: 88 (12/08 1633)  Respirations: 18 PER MINUTE (12/08 1840)  SpO2: 99 % (12/08 1840)  O2 Delivery: None (Room Air) (12/08 1840) BP: (83-150)/(48-102)   Temp:  [36.7 ???C (98 ???F)-39.3 ???C (102.7 ???F)]   Pulse:  [88-122]   Respirations:  [18 PER MINUTE-22 PER MINUTE]   SpO2:  [97 %-100 %]   O2 Delivery: None (Room Air)   Intensity Pain Scale (Self Report): 9 (01/15/17 1610) Vitals:    01/14/17 1335 01/14/17 1800 01/15/17 0600   Weight: 112 kg (246 lb 14.6 oz) 112 kg (246 lb 14.6 oz) 114.8 kg (253 lb 1.4 oz)         Intake/Output Summary:  (Last 24 hours)    Intake/Output Summary (Last 24 hours) at 01/15/2017 2011  Last data filed at 01/15/2017 1819  Gross per 24 hour   Intake 300 ml   Output 0 ml   Net 300 ml      Stool Occurrence: 1    Physical Exam  General: Patient seems to be clinically stable and in no immediate distress. AAO x3   HEENT: NC/AT  Heart: S1 S2 present, RRR, no audible rubs or murmurs heard  Lungs: Bilateral air entry noted, normal breath sounds and no additional sounds heard  Abdomen: Soft, non tender and no masses noted. Bowel sounds were normal. PD in place.   Extremities: no pedal edema; no calf tenderness. Tenderness over the R thigh.   Skin: No obvious skin rashes Neurologic: Seems oriented. No obvious abnormality of cranial nerves, motor system noted      Labs reviwed, Notable for:        Recent Labs      01/12/17   2310  01/13/17   0327  01/14/17   0418  01/15/17   0325   NA  131*  134*  130*  134*   K  5.9*  3.4*  4.6  3.6   CL  96*  95*  93*  96*   CO2  22  27  27  27    GAP  13*  12  10  11    BUN  58*  19  41*  21   CR  13.89*  5.10*  10.94*  7.40*   GLU  99  98  104*  100   CA  8.0*  8.8  7.1*  7.9*   ALBUMIN  3.7  3.8  3.3*  3.2*   MG  2.1  1.8  1.9  1.8   PO4  5.4*  2.5  6.6*  4.7*   TSH  0.380   --    --    --        Recent Labs      01/12/17   2310  01/13/17   0327  01/14/17   0418  01/14/17   0930  01/14/17   1035  01/15/17   0325  01/15/17   1024  01/15/17   1336  01/15/17   1855   WBC  15.8*  14.7*  14.1*  12.1*   --   9.4   --    --    --    HGB  8.2*  8.6*  7.1*  6.8*   --   6.7*  6.6*   --   7.6*   HCT  25.1*  26.4*  22.0*  21.1*   --   20.7*  20.5*   --   23.2*   PLTCT  175  172  142*  137*   --   153   --    --    --    INR  1.1   --    --    --   1.3*   --    --    --    --    PTT  25.3   --    --    --    --    --    --   24.8   --    AST  12  14  17    --    --   17   --    --    --    ALT  7  10  11    --    --   9   --    --    --    ALKPHOS  115*  120*  89   --    --   77   --    --    --  Estimated Creatinine Clearance: 14.7 mL/min (A) (based on SCr of 7.4 mg/dL (H)).  Vitals:    01/14/17 1335 01/14/17 1800 01/15/17 0600   Weight: 112 kg (246 lb 14.6 oz) 112 kg (246 lb 14.6 oz) 114.8 kg (253 lb 1.4 oz)    No results for input(s): PHART, PO2ART in the last 72 hours.    Invalid input(s): PC02A    BNP:   Recent Labs      01/13/17   1658   BNP  49.0     CARDIAC ENZYMES: No results for input(s): CKMB in the last 72 hours.    Glucose: 100 (01/15/17 0325)       Microbiology - Resulted Micro Last 72 Hrs      CULTURE-CATHETER TIP W/SENSITIVITY  Resulted: 01/15/17 1337, Result status: Preliminary result Ordering provider:  Barron Alvine, APRN  01/14/17 1722 Resulting lab:  [place] [facility]    Specimen Information    Source Collected On   Catheter Tip 01/14/17 1925          Components    Component Value Flag   Battery Name CATHETER TIP CULTURE  ???   Specimen Description CATHETER TIP  ???   Special Requests NONE  ???   Culture --  ???   Result:       >15 CFU  STAPHYLOCOCCUS AUREUS     Report Status --  ???            CULTURE-WOUND/TISSUE/FLUID(AEROBIC ONLY)W/SENSITIVITY  Resulted: 01/15/17 1316, Result status: Preliminary result   Ordering provider:  Barron Alvine, APRN  01/13/17 1722 Resulting lab:  [place] [facility]    Specimen Information    Source Collected On   Swab 01/13/17 1731          Components    Component Value Flag   Battery Name ROUTINE CULTURE  ???   Specimen Description --  ???   Result:       SWAB  RIGHT  GROIN PUNCTURE     Special Requests NONE  ???   Direct Gram Stain --  ???   Result:       NO NEUTROPHILS SEEN  FEW  RBC'S  RARE  GRAM POSITIVE COCCI RESEMBLING STAPHYLOCOCCI     Culture --  ???   Result:       Light growth  STREPTOCOCCUS AGALACTIAE(GROUP B)  Light growth  CORYNEBACTERIUM SPECIES  Light growth  STAPHYLOCOCCUS, COAGULASE NEGATIVE     Report Status --  ???            CULTURE-BLOOD W/SENSITIVITY  Resulted: 01/15/17 0425, Result status: Preliminary result   Ordering provider:  Dionne Ano, MD  01/13/17 2102 Resulting lab:  [place] [facility]    Specimen Information    Source Collected On   Blood 01/13/17 2230          Components    Component Value Flag   Battery Name BLOOD CULTURE  ???   Specimen Description --  ???   Result:       BLOOD  LEFT  ANTECUBITAL     Special Requests NONE  ???   Culture NO GROWTH 1 DAY  ???   Report Status --  ???            CULTURE-BLOOD W/SENSITIVITY  Resulted: 01/15/17 0425, Result status: Preliminary result   Ordering provider:  Dionne Ano, MD  01/13/17 2102 Resulting lab:  [place] [facility]    Specimen Information    Source Collected On  Blood 01/13/17 2315          Components Component Value Flag   Battery Name BLOOD CULTURE  ???   Specimen Description --  ???   Result:       BLOOD  RIGHT  SC PORT     Special Requests NONE  ???   Culture NO GROWTH 1 DAY  ???   Report Status --  ???            CULTURE-BLOOD W/SENSITIVITY  Resulted: 01/15/17 0425, Result status: Preliminary result   Ordering provider:  Barron Alvine, APRN  01/14/17 0716 Resulting lab:  [place] [facility]    Specimen Information    Source Collected On   Blood 01/14/17 1335          Components    Component Value Flag   Battery Name BLOOD CULTURE  ???   Specimen Description --  ???   Result:       BLOOD  DIALYSIS CATHER     Special Requests NONE  ???   Culture NO GROWTH 1 DAY  ???   Report Status --  ???            CULTURE-BLOOD W/SENSITIVITY  Resulted: 01/15/17 0425, Result status: Preliminary result   Ordering provider:  Barron Alvine, APRN  01/14/17 1616 Resulting lab:  [place] [facility]    Specimen Information    Source Collected On   Blood 01/14/17 2015          Components    Component Value Flag   Battery Name BLOOD CULTURE  ???   Specimen Description --  ???   Result:       BLOOD  R CHEST PORT     Special Requests NONE  ???   Culture NO GROWTH 1 DAY  ???   Report Status --  ???            CULTURE-BLOOD W/SENSITIVITY  Resulted: 01/14/17 2228, Result status: Preliminary result   Ordering provider:  Adrian Prince, DO  01/12/17 2257 Resulting lab:  [place] [facility]    Specimen Information    Source Collected On   Blood 01/13/17 0917          Components    Component Value Flag   Battery Name BLOOD CULTURE  ???   Specimen Description --  ???   Result:       BLOOD  RIGHT  CHEST   PORT     Special Requests NONE  ???   Culture --  ???   Result:       POSITIVE SMEAR:  GRAM POSITIVE COCCI RESEMBLING STREPTOCOCCI  one bottle only  CRITICAL VALUE CALLED TO AND READ BACK BY/TIME/TECH  Tuyen K/ 1548 12.7.18/ND  Culture in progress     Report Status --  ???            CULTURE-BLOOD W/SENSITIVITY (Abnormal)  Resulted: 01/14/17 2216, Result status: Preliminary result Ordering provider:  Adrian Prince, DO  01/12/17 2257 Resulting lab:  [place] [facility]    Specimen Information    Source Collected On   Blood 01/12/17 2310          Components    Component Value Flag   Battery Name BLOOD CULTURE  ???   Specimen Description --  ???   Result:       BLOOD  RIGHT  HD CATH     Special Requests NONE  ???   Culture --  A   Result:  POSITIVE SMEAR:  GRAM POSITIVE COCCI RESEMBLING STREPTOCOCCI  one bottle only  CRITICAL VALUE CALLED TO AND READ BACK BY/TIME/TECH  MAKENZIE S/1736 12.6.18/[facility]  STREPTOCOCCUS AGALACTIAE(GROUP B)  Susceptibility testing of beta lactams is not necessary for clinical purposes   since resistant strains have not been recognized     Report Status --  ???   Organism ID --  ???   Result:       STREPTOCOCCUS AGALACTIAE(GROUP B)  Susceptibility testing of beta lactams is not necessary for clinical purposes   since resistant strains have not been recognized            Sensitivities     Organism Antibiotic Sensitivity    Streptococcus agalactiae(group b) susceptibility testing of beta lactams is not necessary for clinical purposes  since resistant strains have not been recognized Levofloxacin SUSCEPTIBLE Susceptible    KIRBY BAUER      Streptococcus agalactiae(group b) susceptibility testing of beta lactams is not necessary for clinical purposes  since resistant strains have not been recognized Method KIRBY BAUER     KIRBY Bronwen Betters STAIN  Resulted: 01/13/17 2105, Result status: Final result   Ordering provider:  Barron Alvine, APRN  01/13/17 1731 Resulting lab:  [place] [facility]    Specimen Information    Source Collected On   Swab 01/13/17 1731          Components    Component Value Flag   Battery Name GRAM STAIN   ???   Specimen Description --  ???   Result:       SWAB  RIGHT  GROIN PUNCTURE     Special Requests NONE  ???   Gram Stain --  ???   Result:       NO NEUTROPHILS SEEN  FEW  RBC'S  RARE  GRAM POSITIVE COCCI RESEMBLING STAPHYLOCOCCI     Report Status --  ??? Result:       FINAL  01/13/2017

## 2017-01-17 ENCOUNTER — Encounter: Admit: 2017-01-17 | Discharge: 2017-01-17 | Payer: MEDICARE

## 2017-01-17 ENCOUNTER — Inpatient Hospital Stay: Admit: 2017-01-17 | Discharge: 2017-01-17 | Payer: MEDICARE

## 2017-01-17 DIAGNOSIS — T80211A Bloodstream infection due to central venous catheter, initial encounter: Principal | ICD-10-CM

## 2017-01-17 LAB — PTT (APTT)
Lab: 111 s — ABNORMAL HIGH (ref 24.0–36.5)
Lab: 111 s — ABNORMAL HIGH (ref 24.0–36.5)

## 2017-01-17 LAB — MAGNESIUM: Lab: 1.9 mg/dL — ABNORMAL LOW (ref >60)

## 2017-01-17 LAB — COMPREHENSIVE METABOLIC PANEL
Lab: 133 MMOL/L — ABNORMAL LOW (ref 137–147)
Lab: 6.5 g/dL — ABNORMAL HIGH (ref 6.0–8.0)

## 2017-01-17 LAB — CBC AND DIFF: Lab: 6.4 K/UL — ABNORMAL LOW (ref 4.5–11.0)

## 2017-01-17 LAB — PERIPHERAL SMEAR

## 2017-01-17 LAB — PHOSPHORUS: Lab: 9.3 mg/dL — ABNORMAL HIGH (ref 2.0–4.5)

## 2017-01-17 MED ORDER — LIDOCAINE (PF) 200 MG/10 ML (2 %) IJ SYRG
0 refills | Status: DC
Start: 2017-01-17 — End: 2017-01-17
  Administered 2017-01-17: 19:00:00 100 mg via INTRAVENOUS

## 2017-01-17 MED ORDER — SODIUM CHLORIDE 0.9 % IV SOLP
INTRAVENOUS | 0 refills | Status: AC
Start: 2017-01-17 — End: ?
  Administered 2017-01-17: 18:00:00 500 mL via INTRAVENOUS

## 2017-01-17 MED ORDER — CEFAZOLIN IVPB
1.25 g | Freq: Two times a day (BID) | INTRAVENOUS | 0 refills | Status: DC
Start: 2017-01-17 — End: 2017-01-24
  Administered 2017-01-17 – 2017-01-24 (×28): 1.25 g via INTRAVENOUS

## 2017-01-17 MED ORDER — ICODEXTRIN  IN AMBUFLEX  2.5 L
2.5 L | INTRAPERITONEAL | 0 refills | Status: CP
Start: 2017-01-17 — End: ?
  Administered 2017-01-18: 01:00:00 2.5 L via INTRAPERITONEAL

## 2017-01-17 MED ORDER — HALOPERIDOL LACTATE 5 MG/ML IJ SOLN
1 mg | Freq: Once | INTRAVENOUS | 0 refills | Status: AC | PRN
Start: 2017-01-17 — End: ?

## 2017-01-17 MED ORDER — DIALYSATE W/2.5% DEX AMBUFLEX 2000ML
2 L | INTRAPERITONEAL | 0 refills | Status: CP
Start: 2017-01-17 — End: ?
  Administered 2017-01-18: 01:00:00 2 L via INTRAPERITONEAL

## 2017-01-17 MED ORDER — PROPOFOL 10 MG/ML IV EMUL 20 ML (INFUSION)(AM)(OR)
INTRAVENOUS | 0 refills | Status: DC
Start: 2017-01-17 — End: 2017-01-17
  Administered 2017-01-17: 19:00:00 140 ug/kg/min via INTRAVENOUS

## 2017-01-17 MED ORDER — PROPOFOL INJ 10 MG/ML IV VIAL
0 refills | Status: DC
Start: 2017-01-17 — End: 2017-01-17
  Administered 2017-01-17: 19:00:00 10 mg via INTRAVENOUS
  Administered 2017-01-17: 19:00:00 50 mg via INTRAVENOUS

## 2017-01-17 MED ORDER — SEVELAMER CARBONATE 800 MG PO TAB
800 mg | Freq: Three times a day (TID) | ORAL | 0 refills | Status: DC
Start: 2017-01-17 — End: 2017-01-29
  Administered 2017-01-18 – 2017-01-28 (×14): 800 mg via ORAL

## 2017-01-17 MED ORDER — DIALYSATE W/2.5% DEX AMBUFLEX 6000ML
6 L | INTRAPERITONEAL | 0 refills | Status: CP
Start: 2017-01-17 — End: ?
  Administered 2017-01-18 (×2): 6 L via INTRAPERITONEAL

## 2017-01-17 MED ORDER — DIPHENHYDRAMINE HCL 50 MG/ML IJ SOLN
25 mg | Freq: Once | INTRAVENOUS | 0 refills | Status: AC | PRN
Start: 2017-01-17 — End: ?

## 2017-01-17 MED ORDER — ONDANSETRON HCL (PF) 4 MG/2 ML IJ SOLN
4 mg | Freq: Once | INTRAVENOUS | 0 refills | Status: DC | PRN
Start: 2017-01-17 — End: 2017-01-17

## 2017-01-17 NOTE — Progress Notes
EGD/Upper EUS/ERCP/Antegrade Enteroscopy  Post Upper Endoscopy Instructions        -You may have a sore throat after the procedure for 2-3 days.  Try sucrets or lozenges to help ease the pain.  If it continues please contact us.    -If you feel feverish, have a temperature of 101 degrees or higher, persistent nausea and vomiting, abdominal pain or dark stools; please notify your nurse or GI physician.    -You may have abdominal cramping following the procedure this can be relieved by belching or passing air.    -If you have redness or swelling at the IV site, place a warm, wet washcloth over the affected areas for 15 minutes, 3-4 times a day until the redness subsides.  If symptoms continue for 2-3 days, contact your regular physician.    - If you have bleeding from your mouth, over 2 tablespoons and increasing, please notify your physician.  A small amount of bleeding is normal if a biopsy or polyps were taken.  If you are vomiting blood you need to seek immediate medical attention.    - You may resume all your routine medications, if medications need to be held your physician and/or nurse will notify you post procedure.    SPECIFIC INSTRUCTIONS  INPATIENTS:  Ask for help when you get up in your room, as you may still be drowsy from your sedation.    OUTPATIENTS:  A. Because of sedation and lack of coordination, UNTIL TOMORROW, DO NOT:  1. Operate any motorized vehicle - this includes driving.  2. Sign any legal documents or conduct important business matters.  3. Use any dangerous machinery (chain saw, lawnmower, etc.).  4. Drink any alcoholic beverages.  Should you have any questions or concerns after your procedure please call [phone] M-F 8am-5:00 pm. After 5:00 pm, holidays or weekends call [phone] and ask for the GI Doctor on call.

## 2017-01-17 NOTE — Progress Notes
Renal Consult    Debra Lam  Admission Date:  01/12/2017         Assessment and Plan     Principal Problem:    CLABSI (central line-associated bloodstream infection)  Active Problems:    ESRD (end stage renal disease) on dialysis (HCC)    Morbid obesity (HCC)    Sepsis (HCC)    Hyperkalemia    Infection due to parainfluenza virus 4    Encephalopathy    Tachycardia    HTN (hypertension)    OSA (obstructive sleep apnea)    Acute respiratory failure with hypoxia (HCC)    Anemia    Acute deep vein thrombosis (DVT) of femoral vein of right lower extremity (HCC)        Debra Lam is a 21 y.o. 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   - 2016 parathyroidectomy   - On phoslo    SOB  - positive for parainfluenza     H/o perirectal abscess 2016  H/o perineal abscess- req I&D/ IV abx 2017  HTN  OSA  Hx of PE  - 2016, likely provoked while on OCP - Was on eliquis, not currently on anticoagulation    Iron deficiency anemia  Tsat 10%, ferritin 610 likely due to acute infection    Recommendations:  - On treatment for line sepsis   - ID consulted; of note, pt with chest port still in place  - will resume PTA PD prescription tonight  - discussed re-placement of tunneled HD catheter with ID; will re-evaluate tomorrow regarding timing of reinsertion of HD line  - She can be discharged on PD if she needs to be line-free for a prolonged duration  - IV iron (Venofer 100 mg should be safe in setting of treated infection)    pt discussed with Dr. Lou Miner, MD  Nephrology fellow  Pager [phone]      History     HPI: Debra Lam is a 21 y.o. female     Resolution of thigh pain. She denies N/V/SOB and is eating. Denies fevers, chills, CP.       Past Medical History   Past Medical History:   Diagnosis Date   ??? Depression    ??? H/O parathyroidectomy    ??? Hypertension    ???  Morbid obesity (HCC)    ??? Perirectal abscess    ??? Pica    ??? PUD (peptic ulcer disease)    ??? Pulmonary embolism (HCC)    ??? Renal failure    ??? Sleep apnea        Past Surgical History:   Procedure Laterality Date   ??? KIDNEY TRANSPLANT  2009   ??? CENTRAL VENOUS CATHETER      hemodialysis catheter placement   ??? TUNNELED VENOUS PORT PLACEMENT         Family History  Family History   Problem Relation Age of Onset   ??? Hypertension Mother    ??? None Reported Brother          Social History   Social History     Socioeconomic History   ??? Marital status: Single     Spouse name: Not on file   ??? Number of children: 0   ??? Years of education: 54   ??? Highest education level: Not on file   Social Needs   ??? Financial resource strain: Not on file   ??? Food insecurity - worry: Not on file   ??? Food insecurity - inability: Not on file   ??? Transportation needs - medical: Not on file   ??? Transportation needs - non-medical: Not on file   Occupational History     Comment: unemployed   Tobacco Use ??? Smoking status: Never Smoker   ??? Smokeless tobacco: Never Used   Substance and Sexual Activity   ??? Alcohol use: No   ??? Drug use: No   ??? Sexual activity: Yes   Other Topics Concern   ??? Military Service Not Asked   ??? Blood Transfusions Yes   ??? Caffeine Concern Not Asked   ??? Occupational Exposure Not Asked   ??? Hobby Hazards Not Asked   ??? Sleep Concern Not Asked   ??? Stress Concern Not Asked   ??? Weight Concern Not Asked   ??? Special Diet Not Asked   ??? Back Care Not Asked   ??? Exercise No   ??? Bike Helmet Not Asked   ??? Seat Belt Not Asked   ??? Self-Exams Not Asked   Social History Narrative   ??? Not on file         Medications  MEDS    calcium acetate 1,334 mg Oral TID w/ meals   ceFAZolin  (ANCEF)  IV 1.25 g Intravenous Q12H*   cetirizine 5 mg Oral QAM8   dialysate w/2.5% dex (AMBU) 2 L Intraperitoneal PD x1 Bag   dialysate w/2.5% dex (AMBU) 6 L Intraperitoneal PD x2 Bags   heparin (porcine) 20-40 Units/kg Intravenous As Prescribed   icodextrin (EXTRANEAL AMBUFLEX) 2.5 L Intraperitoneal PD x1 Bag   iron sucrose (VENOFER) IV 100 mg Intravenous Q24H*   lidocaine 1 patch Topical QDAY   pantoprazole 40 mg Intravenous BID   senna/docusate 10 mL Oral BID   sevelamer carbonate 800 mg Per NG tube TID w/ meals    IV MEDS  ??? heparin (porcine) 20,000 units/D5W 500 mL infusion (std conc)(premade) 2,200 Units/hr (01/17/17 1542)   ??? sodium chloride 0.9 %   infusion       Prn diphenhydrAMINE Once PRN, emu PRN, haloperidol Once PRN, HYDROcodone/acetaminophen Q4H PRN 1 tablet at 01/17/17 0604, ondansetron (ZOFRAN) IV Once PRN, ondansetron (ZOFRAN) IV Q6H PRN 4 mg at 01/17/17 0605, tiZANidine Q8H PRN 4 mg at 01/16/17 1320     HOME MEDS  Prior to  Admission Medications   Prescriptions Last Dose Informant Patient Reported? Taking?   amLODIPine (NORVASC) 10 mg tablet   Yes No   Sig: Take 10 mg by mouth daily.   calcitriol (ROCALTROL) 0.5 mcg capsule   Yes No   Sig: Take 1 mcg by mouth daily.   calcium acetate (PHOSLO) 667 mg capsule   Yes No Sig: Take 1,334 mg by mouth three times daily with meals.   carvedilol (COREG) 25 mg tablet   Yes No   Sig: Take 25 mg by mouth twice daily with meals. Take with food.   cetirizine (ZYRTEC) 5 mg tablet   Yes No   Sig: Take 5 mg by mouth every morning.   fosinopril (MONOPRIL) 40 mg tablet   Yes No   Sig: Take 40 mg by mouth daily.   polyethylene glycol 3350 (MIRALAX) 17 g packet   Yes No   Sig: Take 17 g by mouth daily as needed.   potassium chloride SR (K-DUR) 10 mEq tablet   Yes No   Sig: Take 10 mEq by mouth daily. Take with a meal and a full glass of water.      Facility-Administered Medications: None              Physical Exam        Vital Signs: Last Filed In 24 Hours Vital Signs: 24 Hour Range   BP: 135/95 (12/10 1504)  Temp: 36.8 ???C (98.2 ???F) (12/10 1504)  Pulse: 86 (12/10 1504)  Respirations: 20 PER MINUTE (12/10 1504)  SpO2: 100 % (12/10 1504)  O2 Delivery: None (Room Air) (12/10 1504)  SpO2 Pulse: 82 (12/10 1345) BP: (120-143)/(67-110)   Temp:  [36.6 ???C (97.9 ???F)-36.8 ???C (98.3 ???F)]   Pulse:  [82-94]   Respirations:  [12 PER MINUTE-28 PER MINUTE]   SpO2:  [90 %-100 %]   O2 Delivery: None (Room Air)          Vitals:    01/14/17 1335 01/14/17 1800 01/15/17 0600   Weight: 112 kg (246 lb 14.6 oz) 112 kg (246 lb 14.6 oz) 114.8 kg (253 lb 1.4 oz)       Intake/Output Summary (Last 24 hours) at 01/17/2017 1548  Last data filed at 01/17/2017 1300  Gross per 24 hour   Intake 500 ml   Output 0 ml   Net 500 ml        General:  Alert, cooperative, obese female  Head:  Normocephalic, without obvious abnormality, atraumatic  Eyes:  Conjunctivae/corneas clear.  PERRL, EOMs intact.  Neck:  Supple  Chest: port  Lungs:  CTAB anteriorly   Heart:    RRR; S1, S2 normal; No M/R/G  Abdomen:  Soft, non-tender; PD catheter, site clean and dry  Extremities:  Right thigh minimally tender to touch   Skin:   Dry skin; No rashes or lesions  Neurologic:  CNII - XII grossly intact.       Labs     Recent Labs      01/15/17   0325  01/16/17 0408  01/17/17   0412   NA  134*  134*  133*   K  3.6  3.7  3.6   CL  96*  96*  94*   CO2  27  25  24    GAP  11  13*  15*   BUN  21  32*  34*   CR  7.40*  9.35*  11.49*   GLU  100  110*  100   CA  7.9*  6.5*  6.0*   ALBUMIN  3.2*  3.1*  2.9*   MG  1.8  1.9  1.9   PO4  4.7*  7.0*  9.3*   TSH   --   1.050   --        Recent Labs      01/15/17   0325  01/15/17   1024  01/15/17   1336  01/15/17   1855  01/15/17   2100  01/16/17   0005  01/16/17   0408  01/16/17   0606  01/16/17   1300  01/16/17   1944  01/17/17   0144  01/17/17   0412   WBC  9.4   --    --    --    --    --   7.4   --    --    --    --   6.4   HGB  6.7*  6.6*   --   7.6*   --   7.1*  7.1*  7.0*  7.7*   --    --   7.4*   HCT  20.7*  20.5*   --   23.2*   --   22.3*  22.2*  21.7*  23.7*   --    --   22.8*   PLTCT  153   --    --    --    --    --   169   --    --    --    --   174   PTT   --    --   24.8   --   81.9*   --    --   65.0*  73.1*  111.9*  111.0*   --    AST  17   --    --    --    --    --   16   --    --    --    --   11   ALT  9   --    --    --    --    --   10   --    --    --    --   5*   ALKPHOS  77   --    --    --    --    --   68   --    --    --    --   64      Estimated Creatinine Clearance: 9.5 mL/min (A) (based on SCr of 11.49 mg/dL (H)).  Vitals:    01/14/17 1335 01/14/17 1800 01/15/17 0600   Weight: 112 kg (246 lb 14.6 oz) 112 kg (246 lb 14.6 oz) 114.8 kg (253 lb 1.4 oz)    No results for input(s): PHART, PO2ART in the last 72 hours.    Invalid input(s): PC02A                        Radiology     Pertinent radiology reviewed.

## 2017-01-17 NOTE — Progress Notes
Assumed care at 1900. Assessments completed and documented. Orders, labs, MAR reviewed. No acute changes throughout the night. VSS     Mentation: A&Ox4; calm & cooperative; follows commands appropriately.     Cardiac: S1; S2; palpable pulses; Tele Status: SR     Respiratory: patient's breathing is non-labored and symmetrical; clear in apices and decreased in bases.     GI/GU: voids, last BM: 01/15/17    Nutrition: patient is on a regular diet, tolerates.     Activity: SBA    Pain: patient did not c/o pain through out the night.    Will continue to monitor and pass along to oncoming RN.

## 2017-01-17 NOTE — Anesthesia Post-Procedure Evaluation
Post-Anesthesia Evaluation    Name: Debra Lam      MRN: [id number]     DOB: 10/12/1995     Age: 21 y.o.     Sex: female   __________________________________________________________________________     Procedure Date: 01/17/2017  Procedure: Procedure(s):  ESOPHAGOGASTRODUODENOSCOPY      Surgeon: Surgeon(s):  Everardo AllBonino, John A, MD  Annie SableLim, Diego A, MD    Post-Anesthesia Vitals  BP: 135/102 (12/10 1345)  Pulse: 83 (12/10 1345)  Respirations: 27 PER MINUTE (12/10 1345)  SpO2: 91 % (12/10 1345)  SpO2 Pulse: 82 (12/10 1345)      Post Anesthesia Evaluation Note    Evaluation location: pre/post  Patient participation: recovered; patient participated in evaluation  Level of consciousness: alert    Pain score: 0  Pain management: adequate    Hydration: normovolemia  Temperature: 36.0C - 38.4C  Airway patency: adequate    Perioperative Events  Perioperative events:  no       Post-op nausea and vomiting: no PONV    Postoperative Status  Cardiovascular status: hemodynamically stable  Respiratory status: spontaneous ventilation  Additional comments:   Patient discharged at 13:48        Perioperative Events  Perioperative Event: No  Emergency Case Activation: No

## 2017-01-18 ENCOUNTER — Encounter: Admit: 2017-01-18 | Discharge: 2017-01-18 | Payer: MEDICARE

## 2017-01-18 LAB — PTT (APTT)
Lab: 82 s — ABNORMAL HIGH (ref 24.0–36.5)
Lab: 89 s — ABNORMAL HIGH (ref 24.0–36.5)

## 2017-01-18 LAB — URINALYSIS DIPSTICK
Lab: NEGATIVE
Lab: NEGATIVE
Lab: NEGATIVE
Lab: NEGATIVE
Lab: NEGATIVE

## 2017-01-18 LAB — URINALYSIS, MICROSCOPIC

## 2017-01-18 LAB — CBC AND DIFF
Lab: 0 % — ABNORMAL LOW (ref >60)
Lab: 0 10*3/uL (ref 0–0.20)
Lab: 0.2 10*3/uL (ref 0–0.45)
Lab: 0.5 10*3/uL (ref 0–0.80)
Lab: 1.5 10*3/uL (ref 1.0–4.8)
Lab: 2.7 M/UL — ABNORMAL LOW (ref 4.0–5.0)
Lab: 24 % (ref 24–44)
Lab: 24 % — ABNORMAL LOW (ref >60)
Lab: 4 % — ABNORMAL LOW (ref >60)
Lab: 4.1 10*3/uL (ref 1.8–7.0)
Lab: 6.4 K/UL — ABNORMAL LOW (ref 4.5–11.0)
Lab: 7.9 g/dL — ABNORMAL LOW (ref >60)
Lab: 8 % — ABNORMAL HIGH (ref 4–12)
Lab: 89 FL — ABNORMAL LOW (ref 80–100)

## 2017-01-18 LAB — URINE HGB FOR TRANFUSION REACTION

## 2017-01-18 LAB — CULTURE-BLOOD W/SENSITIVITY

## 2017-01-18 LAB — COMPREHENSIVE METABOLIC PANEL
Lab: 135 MMOL/L — ABNORMAL LOW (ref 137–147)
Lab: 3.4 MMOL/L — ABNORMAL LOW (ref 3.5–5.1)

## 2017-01-18 MED ORDER — DIALYSATE W/2.5% DEX AMBUFLEX 2000ML
2 L | INTRAPERITONEAL | 0 refills | Status: CP
Start: 2017-01-18 — End: ?
  Administered 2017-01-19: 2 L via INTRAPERITONEAL

## 2017-01-18 MED ORDER — ICODEXTRIN  IN AMBUFLEX  2.5 L
2.5 L | INTRAPERITONEAL | 0 refills | Status: CP
Start: 2017-01-18 — End: ?
  Administered 2017-01-19: 2.5 L via INTRAPERITONEAL

## 2017-01-18 MED ORDER — FLUCONAZOLE 150 MG PO TAB
150 mg | Freq: Once | ORAL | 0 refills | Status: CP
Start: 2017-01-18 — End: ?
  Administered 2017-01-18: 17:00:00 150 mg via ORAL

## 2017-01-18 MED ORDER — DIALYSATE W/2.5% DEX AMBUFLEX 6000ML
6 L | INTRAPERITONEAL | 0 refills | Status: CP
Start: 2017-01-18 — End: ?
  Administered 2017-01-19 (×2): 6 L via INTRAPERITONEAL

## 2017-01-18 NOTE — Progress Notes
Inpatient Progress Note        Name:  Debra Lam   Medical Record Number: [id number]          Account Number:  [id number]  Date Of Birth:  1995/03/19                           Room and Bed Number -  BH4605/01  Today's Date:  01/17/2017  LOS: 5 days    Age: 21 years  female    Chief Complaint:  on 01/12/2017    Admitting diagnosis: CLABSI (central line-associated bloodstream infection)    Assessment     Principal Problem:    CLABSI (central line-associated bloodstream infection)  Active Problems:    ESRD (end stage renal disease) on dialysis (HCC)    Morbid obesity (HCC)    Sepsis (HCC)    Hyperkalemia    Infection due to parainfluenza virus 4    Encephalopathy    Tachycardia    HTN (hypertension)    OSA (obstructive sleep apnea)    Acute respiratory failure with hypoxia (HCC)    Anemia    Acute deep vein thrombosis (DVT) of femoral vein of right lower extremity (HCC)    Debra Lam???is a 21 y.o.???female???with h/o   - HTN,   - 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     who presented as a transfer from OSH for worsening renal failure and R thigh pain. To ICU for urgent HD and management. On???zosyn/zyvox???for sepsis, blood cx + for Gm + cocci. RVP + for parainfluenza. Renal consulted, ID and  IR consulted.     # Sepsis: resolving  - due to infected femoral line first used for hemodialysis on 01/11/17   - cath tip Dec 7: staph Aureus   - [facility] Dec 5 from HD cath is positive for [facility] (pending)  - on zyvox and zosyn till Dec 10   - start cefazolin (discussed with ID)  - f/u cx     # Acute DVT   - most likely due to infected line   - started on heparin on Dec 8   -  Discussed benefits risks of different options including filter placements. Decision was to try to cont heparin gtt and monitor Hb, if dropping may need filter. 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   -  Had second transfusion on Dec 8  - had third transfusion on Dec 9.   - Anemia could be from menstruation vs CKD  - plan for EGD on Monday as pt had Hx of PUD due to NSAID  - PPI IV   - f/u EGD report   - f/u records from [facility].     # HTN   - hold anti HTN w sepsis     # ESRD   - eval for PD is per NEphro     Total floor/unit time (reviewing and writing notes, examining the patient, reviewing test results etc) spent was 35 minutes of which > 50% was spent in care coordination  and bedside counseling (explaining treatment options, disease processes, laboratory/imaging results, prognosis, risks and benefits of  treatment options, medication side effects, importance of compliance with treatment, risk factor reduction, follow up with primary care physician).    This note  was created using  [employer], hence  some grammatical errors may still be present despite  editing at the time of the dictation, )    Plan:      ???  expected discharge on  Dec 14?   ADVANCE DIET AS TOLERATED  ??? DIET RENAL DIALYSIS PATIENTS  ???  records from previous encounters have been reviewed .  ???  results, diagnoses, prognoses were reviewed with the patient    ??? Radiology, imaging, labs were reviewed and summarized as above.    ??? Benefits / risks were explained in details including bleeding from heparin and death.   ??? pt was educated, informed about the importance of compliance and instructed to follow up with the PCP, Nephro, GI   ???  discussed with RN, pharmacy, and SW in multidisciplinary round    ???  PT/ OT       Future Appointments   Date Time Provider Department Center   01/24/2017  8:15 AM Reubin Milan, MD [facility] [place]   01/24/2017  9:30 AM [place] ECHO 2 [facility] [place]   02/09/2017 12:00 PM Cecilie Lowers, PhD NEUROPS None   02/24/2017  8:00 AM SURGERY VASCULAR LAB [facility] 02/24/2017  9:30 AM Gladstone Lighter, MD [facility]       Subjective:      Pt seen and Chart reviewed.   No issues overnight. Had BM with no blood. Mild abd discomfort.     ROS:      A comprehensive 10-point ROS was negative except for mild abd epigastric discomfort.      Medications:    Scheduled Meds:    calcium acetate (PHOSLO) capsule 1,334 mg 1,334 mg Oral TID w/ meals   ceFAZolin (ANCEF) 1.25 g in dextrose 5% (D5W) 50 mL IVPB 1.25 g Intravenous Q12H*   cetirizine (ZYRTEC) tablet 5 mg 5 mg Oral QAM8   dialysate w/2.5% dex (AMBU) infusion 2 L 2 L Intraperitoneal PD x1 Bag   heparin (porcine) injection 2,300-4,590 Units 20-40 Units/kg Intravenous As Prescribed   icodextrin (EXTRANEAL AMBUFLEX) 2.5 L infusion 2.5 L Intraperitoneal PD x1 Bag   iron sucrose (VENOFER) injection 100 mg 100 mg Intravenous Q24H*   lidocaine (LIDODERM) 5 % topical patch 1 patch 1 patch Topical QDAY   pantoprazole (PROTONIX) injection 40 mg 40 mg Intravenous BID   senna/docusate (SENOKOT-S) solution 10 mL 10 mL Oral BID   sevelamer carbonate (RENVELA) tablet 800 mg 800 mg Oral TID w/ meals   Continuous Infusions:  ??? heparin (porcine) 20,000 units/D5W 500 mL infusion (std conc)(premade) 2,200 Units/hr (01/17/17 1542)   ??? sodium chloride 0.9 %   infusion       PRN and Respiratory Meds:diphenhydrAMINE Once PRN, emu PRN, haloperidol Once PRN, HYDROcodone/acetaminophen Q4H PRN, ondansetron (ZOFRAN) IV Q6H PRN, tiZANidine Q8H PRN      Objective:                            Vital Signs: Last Filed                 Vital Signs: 24 Hour Range   BP: 135/95 (12/10 1504)  Temp: 36.8 ???C (98.2 ???F) (12/10 1504)  Pulse: 86 (12/10 1504)  Respirations: 20 PER MINUTE (12/10 1504)  SpO2: 100 % (  12/10 1504)  O2 Delivery: None (Room Air) (12/10 1504)  SpO2 Pulse: 82 (12/10 1345) BP: (124-143)/(77-110)   Temp:  [36.8 ???C (98.2 ???F)-36.8 ???C (98.3 ???F)]   Pulse:  [82-91]   Respirations:  [12 PER MINUTE-28 PER MINUTE]   SpO2:  [90 %-100 %] O2 Delivery: None (Room Air)     Vitals:    01/14/17 1335 01/14/17 1800 01/15/17 0600   Weight: 112 kg (246 lb 14.6 oz) 112 kg (246 lb 14.6 oz) 114.8 kg (253 lb 1.4 oz)         Intake/Output Summary:  (Last 24 hours)    Intake/Output Summary (Last 24 hours) at 01/17/2017 1850  Last data filed at 01/17/2017 1530  Gross per 24 hour   Intake 770 ml   Output 0 ml   Net 770 ml      Stool Occurrence: 0    Physical Exam    General: Patient seems to be clinically stable and in no immediate distress. AAO x3   HEENT: NC/AT  Heart: S1 S2 present, RRR, no audible rubs or murmurs heard  Lungs: Bilateral air entry noted, normal breath sounds and no additional sounds heard  Abdomen: Soft, mild epigastric tenderness and no masses noted. Bowel sounds were normal. PD in place.   Extremities: no pedal edema; no calf tenderness. Improvement in Tenderness over the R thigh.   Skin: No obvious skin rashes   Neurologic: Seems oriented. No obvious abnormality of cranial nerves, motor system noted      Labs reviwed, Notable for:        Recent Labs      01/15/17   0325  01/16/17   0408  01/17/17   0412   NA  134*  134*  133*   K  3.6  3.7  3.6   CL  96*  96*  94*   CO2  27  25  24    GAP  11  13*  15*   BUN  21  32*  34*   CR  7.40*  9.35*  11.49*   GLU  100  110*  100   CA  7.9*  6.5*  6.0*   ALBUMIN  3.2*  3.1*  2.9*   MG  1.8  1.9  1.9   PO4  4.7*  7.0*  9.3*   TSH   --   1.050   --        Recent Labs      01/15/17   0325  01/15/17   1024  01/15/17   1336  01/15/17   1855  01/15/17   2100  01/16/17   0005  01/16/17   0408  01/16/17   0606  01/16/17   1300  01/16/17   1944  01/17/17   0144  01/17/17   0412   WBC  9.4   --    --    --    --    --   7.4   --    --    --    --   6.4   HGB  6.7*  6.6*   --   7.6*   --   7.1*  7.1*  7.0*  7.7*   --    --   7.4*   HCT  20.7*  20.5*   --   23.2*   --   22.3*  22.2*  21.7*  23.7*   --    --  22.8*   PLTCT  153   --    --    --    --    --   169   --    --    --    --   174 PTT   --    --   24.8   --   81.9*   --    --   65.0*  73.1*  111.9*  111.0*   --    AST  17   --    --    --    --    --   16   --    --    --    --   11   ALT  9   --    --    --    --    --   10   --    --    --    --   5*   ALKPHOS  77   --    --    --    --    --   68   --    --    --    --   64      Estimated Creatinine Clearance: 9.5 mL/min (A) (based on SCr of 11.49 mg/dL (H)).  Vitals:    01/14/17 1335 01/14/17 1800 01/15/17 0600   Weight: 112 kg (246 lb 14.6 oz) 112 kg (246 lb 14.6 oz) 114.8 kg (253 lb 1.4 oz)    No results for input(s): PHART, PO2ART in the last 72 hours.    Invalid input(s): PC02A    BNP:   No results for input(s): BNP in the last 72 hours.  CARDIAC ENZYMES: No results for input(s): CKMB in the last 72 hours.    Glucose: 100 (01/17/17 0412)       Microbiology - Resulted Micro Last 72 Hrs      CULTURE-BLOOD W/SENSITIVITY  Resulted: 01/17/17 0035, Result status: Preliminary result   Ordering provider:  Dionne Ano, MD  01/13/17 2102 Resulting lab:  [place] [facility] LAB    Specimen Information    Source Collected On   Blood 01/13/17 2230          Components    Component Value Flag   Battery Name BLOOD CULTURE  ???   Specimen Description --  ???   Result:       BLOOD  LEFT  ANTECUBITAL     Special Requests NONE  ???   Culture NO GROWTH 3 DAYS  ???   Report Status --  ???            CULTURE-BLOOD W/SENSITIVITY  Resulted: 01/17/17 0035, Result status: Preliminary result   Ordering provider:  Dionne Ano, MD  01/13/17 2102 Resulting lab:  [place] MAIN LAB    Specimen Information    Source Collected On   Blood 01/13/17 2315          Components    Component Value Flag   Battery Name BLOOD CULTURE  ???   Specimen Description --  ???   Result:       BLOOD  RIGHT  SC PORT     Special Requests NONE  ???   Culture NO GROWTH 3 DAYS  ???   Report Status --  ???            CULTURE-BLOOD W/SENSITIVITY  Resulted: 01/17/17 0035, Result status: Preliminary  result   Ordering provider:  Barron Alvine, APRN  01/14/17 [phone] Resulting lab: [place] MAIN LAB    Specimen Information    Source Collected On   Blood 01/14/17 1335          Components    Component Value Flag   Battery Name BLOOD CULTURE  ???   Specimen Description --  ???   Result:       BLOOD  DIALYSIS CATHER     Special Requests NONE  ???   Culture NO GROWTH 3 DAYS  ???   Report Status --  ???            CULTURE-BLOOD W/SENSITIVITY  Resulted: 01/17/17 0035, Result status: Preliminary result   Ordering provider:  Barron Alvine, APRN  01/14/17 1616 Resulting lab:  [place] MAIN LAB    Specimen Information    Source Collected On   Blood 01/14/17 2015          Components    Component Value Flag   Battery Name BLOOD CULTURE  ???   Specimen Description --  ???   Result:       BLOOD  R CHEST PORT     Special Requests NONE  ???   Culture NO GROWTH 3 DAYS  ???   Report Status --  ???            CULTURE-CATHETER TIP W/SENSITIVITY (Abnormal)  Resulted: 01/16/17 2130, Result status: Final result   Ordering provider:  Barron Alvine, APRN  01/14/17 1722 Resulting lab:  [place] MAIN LAB    Specimen Information    Source Collected On   Catheter Tip 01/14/17 1925          Components    Component Value Flag   Battery Name CATHETER TIP CULTURE  ???   Specimen Description CATHETER TIP  ???   Special Requests NONE  ???   Culture --  A   Result:       >15 CFU  STAPHYLOCOCCUS AUREUS     Report Status --  ???   Result:       FINAL  01/16/2017     Organism ID --  ???   Result:       >15 CFU  STAPHYLOCOCCUS AUREUS            Sensitivities     Organism Antibiotic Sensitivity    >15 cfu staphylococcus aureus Clindamycin <=0.5 SUSCEPTIBLE Susceptible    MIC (MCG/ML) INTERPRETATION      >15 cfu staphylococcus aureus Erythromycin >4 RESISTANT Resistant    MIC (MCG/ML) INTERPRETATION      >15 cfu staphylococcus aureus Oxacillin 1 SUSCEPTIBLE Susceptible    MIC (MCG/ML) INTERPRETATION      >15 cfu staphylococcus aureus Vancomycin 1 SUSCEPTIBLE Susceptible    MIC (MCG/ML) INTERPRETATION >15 cfu staphylococcus aureus Tetracycline <=0.5 SUSCEPTIBLE Susceptible    MIC (MCG/ML) INTERPRETATION      >15 cfu staphylococcus aureus Trimethsulfa <=1/19 SUSCEPTIBLE Susceptible    MIC (MCG/ML) INTERPRETATION      >15 cfu staphylococcus aureus Method MIC (MCG/ML) INTERPRETATION     MIC (MCG/ML) INTERPRETATION              CULTURE-WOUND/TISSUE/FLUID(AEROBIC ONLY)W/SENSITIVITY  Resulted: 01/16/17 0820, Result status: Final result   Ordering provider:  Barron Alvine, APRN  01/13/17 1722 Resulting lab:  [place] MAIN LAB    Specimen Information    Source Collected On   Swab 01/13/17 1731          Components  Component Value Flag   Battery Name ROUTINE CULTURE  ???   Specimen Description --  ???   Result:       SWAB  RIGHT  GROIN PUNCTURE     Special Requests NONE  ???   Direct Gram Stain --  ???   Result:       NO NEUTROPHILS SEEN  FEW  RBC'S  RARE  GRAM POSITIVE COCCI RESEMBLING STAPHYLOCOCCI     Culture --  ???   Result:       Light growth  STREPTOCOCCUS AGALACTIAE(GROUP B)  Light growth  CORYNEBACTERIUM SPECIES  Light growth  STAPHYLOCOCCUS, COAGULASE NEGATIVE     Report Status --  ???   Result:       FINAL  01/16/2017              CULTURE-BLOOD W/SENSITIVITY  Resulted: 01/15/17 2244, Result status: Preliminary result   Ordering provider:  Adrian Prince, DO  01/12/17 2257 Resulting lab:  [place] MAIN LAB    Specimen Information    Source Collected On   Blood 01/13/17 0917          Components    Component Value Flag   Battery Name BLOOD CULTURE  ???   Specimen Description --  ???   Result:       BLOOD  RIGHT  CHEST   PORT     Special Requests NONE  ???   Culture --  ???   Result:       POSITIVE SMEAR:  GRAM POSITIVE COCCI RESEMBLING STREPTOCOCCI  one bottle only  CRITICAL VALUE CALLED TO AND READ BACK BY/TIME/TECH  Tuyen K/ 1548 12.7.18/ND  STREPTOCOCCUS AGALACTIAE(GROUP B)  Susceptibility testing of beta lactams is not necessary for clinical purposes   since resistant strains have not been recognized See previous susceptibility     Report Status --  ???            CULTURE-BLOOD W/SENSITIVITY (Abnormal)  Resulted: 01/14/17 2216, Result status: Preliminary result   Ordering provider:  Adrian Prince, DO  01/12/17 2257 Resulting lab:  [place] MAIN LAB    Specimen Information    Source Collected On   Blood 01/12/17 2310          Components    Component Value Flag   Battery Name BLOOD CULTURE  ???   Specimen Description --  ???   Result:       BLOOD  RIGHT  HD CATH     Special Requests NONE  ???   Culture --  A   Result:       POSITIVE SMEAR:  GRAM POSITIVE COCCI RESEMBLING STREPTOCOCCI  one bottle only  CRITICAL VALUE CALLED TO AND READ BACK BY/TIME/TECH  MAKENZIE S/1736 12.6.18/MC  STREPTOCOCCUS AGALACTIAE(GROUP B)  Susceptibility testing of beta lactams is not necessary for clinical purposes   since resistant strains have not been recognized     Report Status --  ???   Organism ID --  ???   Result:       STREPTOCOCCUS AGALACTIAE(GROUP B)  Susceptibility testing of beta lactams is not necessary for clinical purposes   since resistant strains have not been recognized            Sensitivities     Organism Antibiotic Sensitivity    Streptococcus agalactiae(group b) susceptibility testing of beta lactams is not necessary for clinical purposes  since resistant strains have not been recognized Levofloxacin SUSCEPTIBLE Susceptible    KIRBY BAUER  Streptococcus agalactiae(group b) susceptibility testing of beta lactams is not necessary for clinical purposes  since resistant strains have not been recognized Method KIRBY BAUER     Liana Gerold

## 2017-01-18 NOTE — Progress Notes
APD- Automated Peritoneal Dialysis      Discontinued time: 0450    Totals: 6 cycles completed      IDrain Volume: 71 ml  Total UF= 1822 ml   Avg dwell time: 1:01  Lost dwell: 0:12    Last night prescription:   Therapy time: 9 hrs    Fill volume: 2000 mL  Last fill volume: 1500 mL     Last fill volume dextrose: Extraneal  Total volume: [id number] mL

## 2017-01-18 NOTE — Progress Notes
GI STAFF    She has no GI complaints.  Her EGD revealed a normal esophagus and stomach but some nonbleeding duodenal erosions.    At this time I recommend:  1.  Twice daily proton pump inhibitor for 2 months then daily thereafter  2.  Please check H. pylori stool antigen and treat if positive  3.  GI will sign off.  Please call us with any questions.    I reviewed the above in detail with the patient.  Of note when she had had ulcers in the past she does not recall being told this was related to H. pylori and she does not recall ever taking any antibiotics for ulcers.

## 2017-01-18 NOTE — Procedures
[facility]-Automated Peritoneal Dialysis    PT ID CHECKED yes    Start time 1915   Therapy time 9 hours     # of cycles 6             Fill volume 2000 ml   Total volume [id number] ml      Solution 2.5 % Last fill volume 1500      Last fill dextrose % icodextrin      Tunnel: no s/s of infection    Effluent: not visualized

## 2017-01-18 NOTE — Progress Notes
Hematology brief note, full note to follow.  Patient seen around 11:30am this morning  Given prior tolerance to eliquis and recognized use in patients with ESRD would recommend eliquis given warfarin allergy.  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  Debra SetaHeather Anjanae Woehrle, MD

## 2017-01-18 NOTE — Progress Notes
Inpatient Progress Note        Name:  Debra Lam   Medical Record Number: [id number]          Account Number:  [id number]  Date Of Birth:  1995/05/30                           Room and Bed Number -  BH4605/01  Today's Date:  01/18/2017  LOS: 6 days    Age: 21 years  female    Chief Complaint:  on 01/12/2017    Admitting diagnosis: CLABSI (central line-associated bloodstream infection)    Assessment     Principal Problem:    CLABSI (central line-associated bloodstream infection)  Active Problems:    ESRD (end stage renal disease) on dialysis (HCC)    Morbid obesity (HCC)    Sepsis (HCC)    Hyperkalemia    Infection due to parainfluenza virus 4    Encephalopathy    Tachycardia    HTN (hypertension)    OSA (obstructive sleep apnea)    Acute respiratory failure with hypoxia (HCC)    Anemia    Acute deep vein thrombosis (DVT) of femoral vein of right lower extremity (HCC)    Debra Lam???is a 21 y.o.???female???with h/o   - HTN,   - 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     who presented as a transfer from OSH for worsening renal failure and R thigh pain. To ICU for urgent HD and management. On???zosyn/zyvox???for sepsis, blood cx + for Gm + cocci. RVP + for parainfluenza. Renal consulted, ID and  IR consulted.     # Sepsis: resolving  - due to infected femoral line first used for hemodialysis on 01/11/17   - cath tip Dec 7: staph Aureus   - [facility] Dec 5 from HD cath is positive for [facility] (pending)  - on zyvox and zosyn till Dec 10   - start cefazolin (discussed with ID)  - f/u cx   - Rx complicated by vaginal candidiasis. Will give diflucan 150 once empirically. (refused PE).     # Acute DVT   - most likely due to infected line   - started on heparin on Dec 8   -  Discussed benefits risks of different options including filter placements. Decision was to try to cont heparin gtt and monitor Hb, if dropping may need filter.   - GI ok with AC. Pt has been stable for 2 days with PPI   - had bad reaction to coumadin before and was treated with eliquis before ( however, I dont know what was her kid function at that time).   - I would discuss treatment plan with hematology as ELiquis in her case would be off label therapy. The filter has more risk than benefits currently given her age.     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   -  Had second transfusion on Dec 8  - had third transfusion on Dec 9.   - Anemia could be from menstruation vs CKD  - plan for EGD on Monday as pt had Hx of PUD due to  NSAID  - PPI IV BID. Switch to PO on discharge.   - send stool ag for H pylori  - EGD report should nonbleeding duodenal erosions on Dec 10. Counseled NO nsaid.     # HTN   - hold anti HTN w sepsis     # ESRD   - eval for PD is per NEphro     Total floor/unit time (reviewing and writing notes, examining the patient, reviewing test results etc) spent was 35 minutes of which > 50% was spent in care coordination  and bedside counseling (explaining treatment options, disease processes, laboratory/imaging results, prognosis, risks and benefits of treatment options, medication side effects, importance of compliance with treatment, risk factor reduction, follow up with primary care physician).    This note  was created using  [employer], hence  some grammatical errors may still be present despite  editing at the time of the dictation, )    Plan:      ???  expected discharge on  Dec 14?   ADVANCE DIET AS TOLERATED  ??? DIET RENAL DIALYSIS PATIENTS  ???  records from previous encounters have been reviewed .  ???  results, diagnoses, prognoses were reviewed with the patient    ??? Radiology, imaging, labs were reviewed and summarized as above. ??? Benefits / risks were explained in details including bleeding from heparin and death.   ??? pt was educated, informed about the importance of compliance and instructed to follow up with the PCP, Nephro, GI   ???  discussed with RN, pharmacy, and SW in multidisciplinary round    ???  PT/ OT       Future Appointments   Date Time Provider Department Center   01/24/2017  8:15 AM Reubin Milan, MD [facility] [place]   01/24/2017  9:30 AM  ECHO 2 [facility] [place]   02/09/2017 12:00 PM Cecilie Lowers, PhD NEUROPS None   02/24/2017  8:00 AM SURGERY VASCULAR LAB [facility]   02/24/2017  9:30 AM Gladstone Lighter, MD [facility]       Subjective:      Pt seen and Chart reviewed.   No issues overnight. Had BM with no blood. No abd discomfort.     ROS:      A comprehensive 10-point ROS was negative except for  Candidal vaginitis.     Medications:    Scheduled Meds:    calcium acetate (PHOSLO) capsule 1,334 mg 1,334 mg Oral TID w/ meals   ceFAZolin (ANCEF) 1.25 g in dextrose 5% (D5W) 50 mL IVPB 1.25 g Intravenous Q12H*   cetirizine (ZYRTEC) tablet 5 mg 5 mg Oral QAM8   heparin (porcine) injection 2,300-4,590 Units 20-40 Units/kg Intravenous As Prescribed   iron sucrose (VENOFER) injection 100 mg 100 mg Intravenous Q24H*   lidocaine (LIDODERM) 5 % topical patch 1 patch 1 patch Topical QDAY   pantoprazole (PROTONIX) injection 40 mg 40 mg Intravenous BID   senna/docusate (SENOKOT-S) solution 10 mL 10 mL Oral BID   sevelamer carbonate (RENVELA) tablet 800 mg 800 mg Oral TID w/ meals   Continuous Infusions:  ??? heparin (porcine) 20,000 units/D5W 500 mL infusion (std conc)(premade) 2,200 Units/hr (01/18/17 0710)   ??? sodium chloride 0.9 %   infusion       PRN and Respiratory Meds:emu PRN, HYDROcodone/acetaminophen Q4H PRN, ondansetron (ZOFRAN) IV Q6H PRN, tiZANidine Q8H PRN      Objective:  Vital Signs: Last Filed                 Vital Signs: 24 Hour Range   BP: 140/92 (12/11 1233) Temp: 36.8 ???C (98.2 ???F) (12/11 1233)  Pulse: 92 (12/11 1233)  Respirations: 18 PER MINUTE (12/11 1233)  SpO2: 97 % (12/11 1233)  O2 Delivery: None (Room Air) (12/11 1233) BP: (121-157)/(77-99)   Temp:  [36.7 ???C (98 ???F)-36.9 ???C (98.5 ???F)]   Pulse:  [83-95]   Respirations:  [18 PER MINUTE-22 PER MINUTE]   SpO2:  [97 %-100 %]   O2 Delivery: None (Room Air)   Intensity Pain Scale (Self Report): 0 (01/17/17 2149) Vitals:    01/14/17 1335 01/14/17 1800 01/15/17 0600   Weight: 112 kg (246 lb 14.6 oz) 112 kg (246 lb 14.6 oz) 114.8 kg (253 lb 1.4 oz)         Intake/Output Summary:  (Last 24 hours)    Intake/Output Summary (Last 24 hours) at 01/18/2017 1454  Last data filed at 01/18/2017 1000  Gross per 24 hour   Intake 09811 ml   Output 13893 ml   Net 1147 ml      Stool Occurrence: 0    Physical Exam    General: Patient seems to be clinically stable and in no immediate distress. AAO x3   HEENT: NC/AT  Heart: S1 S2 present, RRR, no audible rubs or murmurs heard  Lungs: Bilateral air entry noted, normal breath sounds and no additional sounds heard  Abdomen: Soft, no epigastric tenderness and no masses noted. Bowel sounds were normal. PD in place.   Extremities: no pedal edema; no calf tenderness.resolution in Tenderness over the R thigh.   Skin: No obvious skin rashes   Neurologic: Seems oriented. No obvious abnormality of cranial nerves, motor system noted      Labs reviwed, Notable for:        Recent Labs      01/16/17   0408  01/17/17   0412  01/18/17   0343   NA  134*  133*  135*   K  3.7  3.6  3.4*   CL  96*  94*  94*   CO2  25  24  22    GAP  13*  15*  19*   BUN  32*  34*  36*   CR  9.35*  11.49*  11.97*   GLU  110*  100  122*   CA  6.5*  6.0*  6.1*   ALBUMIN  3.1*  2.9*  3.2*   MG  1.9  1.9   --    PO4  7.0*  9.3*   --    TSH  1.050   --    --        Recent Labs      01/15/17   1855  01/15/17   2100  01/16/17   0005  01/16/17   0408  01/16/17   0606  01/16/17   1300  01/16/17   1944  01/17/17   0144  01/17/17 0412  01/17/17   2142  01/18/17   0343   WBC   --    --    --   7.4   --    --    --    --   6.4   --   6.4   HGB  7.6*   --   7.1*  7.1*  7.0*  7.7*   --    --  7.4*   --   7.9*   HCT  23.2*   --   22.3*  22.2*  21.7*  23.7*   --    --   22.8*   --   24.3*   PLTCT   --    --    --   169   --    --    --    --   174   --   203   PTT   --   81.9*   --    --   65.0*  73.1*  111.9*  111.0*   --   82.2*  89.8*   AST   --    --    --   16   --    --    --    --   11   --   24   ALT   --    --    --   10   --    --    --    --   5*   --   13   ALKPHOS   --    --    --   68   --    --    --    --   64   --   73      Estimated Creatinine Clearance: 9.1 mL/min (A) (based on SCr of 11.97 mg/dL (H)).  Vitals:    01/14/17 1335 01/14/17 1800 01/15/17 0600   Weight: 112 kg (246 lb 14.6 oz) 112 kg (246 lb 14.6 oz) 114.8 kg (253 lb 1.4 oz)    No results for input(s): PHART, PO2ART in the last 72 hours.    Invalid input(s): PC02A    BNP:   No results for input(s): BNP in the last 72 hours.  CARDIAC ENZYMES: No results for input(s): CKMB in the last 72 hours.    Glucose: (!) 122 (01/18/17 0343)       Microbiology - Resulted Micro Last 72 Hrs      CULTURE-BLOOD W/SENSITIVITY  Resulted: 01/18/17 0043, Result status: Preliminary result   Ordering provider:  Dionne Ano, MD  01/13/17 2102 Resulting lab:  [place] [facility]    Specimen Information    Source Collected On   Blood 01/13/17 2230          Components    Component Value Flag   Battery Name BLOOD CULTURE  ???   Specimen Description --  ???   Result:       BLOOD  LEFT  ANTECUBITAL     Special Requests NONE  ???   Culture NO GROWTH 4 DAYS  ???   Report Status --  ???            CULTURE-BLOOD W/SENSITIVITY  Resulted: 01/18/17 0043, Result status: Preliminary result   Ordering provider:  Dionne Ano, MD  01/13/17 2102 Resulting lab:  [place] [facility]    Specimen Information    Source Collected On   Blood 01/13/17 2315          Components    Component Value Flag   Battery Name BLOOD CULTURE  ??? Specimen Description --  ???   Result:       BLOOD  RIGHT  SC PORT     Special Requests NONE  ???   Culture NO GROWTH 4 DAYS  ???   Report Status --  ???  CULTURE-BLOOD W/SENSITIVITY  Resulted: 01/18/17 0043, Result status: Preliminary result   Ordering provider:  Barron Alvine, APRN  01/14/17 0716 Resulting lab:  [place] [facility]    Specimen Information    Source Collected On   Blood 01/14/17 1335          Components    Component Value Flag   Battery Name BLOOD CULTURE  ???   Specimen Description --  ???   Result:       BLOOD  DIALYSIS CATHER     Special Requests NONE  ???   Culture NO GROWTH 4 DAYS  ???   Report Status --  ???            CULTURE-BLOOD W/SENSITIVITY  Resulted: 01/18/17 0043, Result status: Preliminary result   Ordering provider:  Barron Alvine, APRN  01/14/17 1616 Resulting lab:  [place] [facility]    Specimen Information    Source Collected On   Blood 01/14/17 2015          Components    Component Value Flag   Battery Name BLOOD CULTURE  ???   Specimen Description --  ???   Result:       BLOOD  R CHEST PORT     Special Requests NONE  ???   Culture NO GROWTH 4 DAYS  ???   Report Status --  ???            CULTURE-BLOOD W/SENSITIVITY  Resulted: 01/18/17 0043, Result status: Preliminary result   Ordering provider:  Vira Browns, MD  01/17/17 [phone] Resulting lab:  [place] [facility]    Specimen Information    Source Collected On   Blood 01/17/17 1040          Components    Component Value Flag   Battery Name BLOOD CULTURE  ???   Specimen Description --  ???   Result:       BLOOD  RIGHT  PORT     Special Requests NONE  ???   Culture NO GROWTH 1 DAY  ???   Report Status --  ???            CULTURE-BLOOD W/SENSITIVITY  Resulted: 01/18/17 0043, Result status: Preliminary result   Ordering provider:  Vira Browns, MD  01/17/17 [phone] Resulting lab:  [place] [facility]    Specimen Information    Source Collected On   Blood 01/17/17 1045          Components    Component Value Flag   Battery Name BLOOD CULTURE  ??? Specimen Description --  ???   Result:       BLOOD  LEFT  ANTECUBITAL  aerobic bottle only     Special Requests --  ???   Result:       Culture performed on specimen with less than the recommended volume of 10   ml/bottle. Decreased volume will affect sensitivity of culture.     Culture NO GROWTH 1 DAY  ???   Report Status --  ???            CULTURE-BLOOD W/SENSITIVITY (Abnormal)  Resulted: 01/18/17 0024, Result status: Final result   Ordering provider:  Adrian Prince, DO  01/12/17 2257 Resulting lab:  [place] [facility]    Specimen Information    Source Collected On   Blood 01/12/17 2310          Components    Component Value Flag   Battery Name BLOOD CULTURE  ???  Specimen Description --  ???   Result:       BLOOD  RIGHT  HD CATH     Special Requests NONE  ???   Culture --  A   Result:       POSITIVE SMEAR:  GRAM POSITIVE COCCI RESEMBLING STREPTOCOCCI  one bottle only  CRITICAL VALUE CALLED TO AND READ BACK BY/TIME/TECH  MAKENZIE S/1736 12.6.18/[facility]  STREPTOCOCCUS AGALACTIAE(GROUP B)  Susceptibility testing of beta lactams is not necessary for clinical purposes   since resistant strains have not been recognized     Report Status --  ???   Result:       FINAL  01/18/2017     Organism ID --  ???   Result:       STREPTOCOCCUS AGALACTIAE(GROUP B)  Susceptibility testing of beta lactams is not necessary for clinical purposes   since resistant strains have not been recognized            Sensitivities     Organism Antibiotic Sensitivity    Streptococcus agalactiae(group b) susceptibility testing of beta lactams is not necessary for clinical purposes  since resistant strains have not been recognized Levofloxacin SUSCEPTIBLE Susceptible    KIRBY BAUER      Streptococcus agalactiae(group b) susceptibility testing of beta lactams is not necessary for clinical purposes  since resistant strains have not been recognized Method KIRBY BAUER     KIRBY BAUER              CULTURE-CATHETER TIP W/SENSITIVITY (Abnormal)  Resulted: 01/16/17 1610, Result status: Final result   Ordering provider:  Barron Alvine, APRN  01/14/17 1722 Resulting lab:  [place] [facility]    Specimen Information    Source Collected On   Catheter Tip 01/14/17 1925          Components    Component Value Flag   Battery Name CATHETER TIP CULTURE  ???   Specimen Description CATHETER TIP  ???   Special Requests NONE  ???   Culture --  A   Result:       >15 CFU  STAPHYLOCOCCUS AUREUS     Report Status --  ???   Result:       FINAL  01/16/2017     Organism ID --  ???   Result:       >15 CFU  STAPHYLOCOCCUS AUREUS            Sensitivities     Organism Antibiotic Sensitivity    >15 cfu staphylococcus aureus Clindamycin <=0.5 SUSCEPTIBLE Susceptible    MIC (MCG/ML) INTERPRETATION      >15 cfu staphylococcus aureus Erythromycin >4 RESISTANT Resistant    MIC (MCG/ML) INTERPRETATION      >15 cfu staphylococcus aureus Oxacillin 1 SUSCEPTIBLE Susceptible    MIC (MCG/ML) INTERPRETATION      >15 cfu staphylococcus aureus Vancomycin 1 SUSCEPTIBLE Susceptible    MIC (MCG/ML) INTERPRETATION      >15 cfu staphylococcus aureus Tetracycline <=0.5 SUSCEPTIBLE Susceptible    MIC (MCG/ML) INTERPRETATION      >15 cfu staphylococcus aureus Trimethsulfa <=1/19 SUSCEPTIBLE Susceptible    MIC (MCG/ML) INTERPRETATION      >15 cfu staphylococcus aureus Method MIC (MCG/ML) INTERPRETATION     MIC (MCG/ML) INTERPRETATION              CULTURE-WOUND/TISSUE/FLUID(AEROBIC ONLY)W/SENSITIVITY  Resulted: 01/16/17 0820, Result status: Final result   Ordering provider:  Barron Alvine, APRN  01/13/17 1722 Resulting lab:  [place] [facility]  Specimen Information    Source Collected On   Swab 01/13/17 1731          Components    Component Value Flag   Battery Name ROUTINE CULTURE  ???   Specimen Description --  ???   Result:       SWAB  RIGHT  GROIN PUNCTURE     Special Requests NONE  ???   Direct Gram Stain --  ???   Result:       NO NEUTROPHILS SEEN  FEW  RBC'S  RARE  GRAM POSITIVE COCCI RESEMBLING STAPHYLOCOCCI     Culture --  ???   Result: Light growth  STREPTOCOCCUS AGALACTIAE(GROUP B)  Light growth  CORYNEBACTERIUM SPECIES  Light growth  STAPHYLOCOCCUS, COAGULASE NEGATIVE     Report Status --  ???   Result:       FINAL  01/16/2017              CULTURE-BLOOD W/SENSITIVITY  Resulted: 01/15/17 2244, Result status: Preliminary result   Ordering provider:  Adrian Prince, DO  01/12/17 2257 Resulting lab:  [place] [facility]    Specimen Information    Source Collected On   Blood 01/13/17 0917          Components    Component Value Flag   Battery Name BLOOD CULTURE  ???   Specimen Description --  ???   Result:       BLOOD  RIGHT  CHEST   PORT     Special Requests NONE  ???   Culture --  ???   Result:       POSITIVE SMEAR:  GRAM POSITIVE COCCI RESEMBLING STREPTOCOCCI  one bottle only  CRITICAL VALUE CALLED TO AND READ BACK BY/TIME/TECH  Tuyen K/ 1548 12.7.18/ND  STREPTOCOCCUS AGALACTIAE(GROUP B)  Susceptibility testing of beta lactams is not necessary for clinical purposes   since resistant strains have not been recognized  See previous susceptibility     Report Status --  ???

## 2017-01-18 NOTE — Progress Notes
Renal Consult    Debra Lam  Admission Date:  01/12/2017         Assessment and Plan     Principal Problem:    CLABSI (central line-associated bloodstream infection)  Active Problems:    ESRD (end stage renal disease) on dialysis (HCC)    Morbid obesity (HCC)    Sepsis (HCC)    Hyperkalemia    Infection due to parainfluenza virus 4    Encephalopathy    Tachycardia    HTN (hypertension)    OSA (obstructive sleep apnea)    Acute respiratory failure with hypoxia (HCC)    Anemia    Acute deep vein thrombosis (DVT) of femoral vein of right lower extremity (HCC)        Debra Lam is a 21 y.o. 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 PTA PD prescription tonight  - discussed re-placement of tunneled HD catheter with pt who has elected to cont. PD and does not want new HD line  - okay to d/c from renal perspective on PD   - will cont. to follow while pt is inpt     pt discussed with Dr. Lou Miner, MD  Nephrology fellow  Pager [phone]      History     HPI: Debra Lam is a 21 y.o. female     Resolution of thigh pain. She denies N/V/SOB and is eating. Denies fevers, chills, CP.       Past Medical History   Past Medical History:   Diagnosis Date   ??? Depression    ??? H/O parathyroidectomy    ??? Hypertension    ??? Morbid obesity (HCC)    ??? Perirectal abscess    ???  Pica    ??? PUD (peptic ulcer disease)    ??? Pulmonary embolism (HCC)    ??? Renal failure    ??? Sleep apnea        Past Surgical History:   Procedure Laterality Date   ??? KIDNEY TRANSPLANT  2009   ??? CENTRAL VENOUS CATHETER      hemodialysis catheter placement   ??? TUNNELED VENOUS PORT PLACEMENT         Family History  Family History   Problem Relation Age of Onset   ??? Hypertension Mother    ??? None Reported Brother          Social History   Social History     Socioeconomic History   ??? Marital status: Single     Spouse name: Not on file   ??? Number of children: 0   ??? Years of education: 22   ??? Highest education level: Not on file   Social Needs   ??? Financial resource strain: Not on file   ??? Food insecurity - worry: Not on file   ??? Food insecurity - inability: Not on file   ??? Transportation needs - medical: Not on file   ??? Transportation needs - non-medical: Not on file   Occupational History     Comment: unemployed   Tobacco Use   ??? Smoking status: Never Smoker   ??? Smokeless tobacco: Never Used   Substance and Sexual Activity   ??? Alcohol use: No ??? Drug use: No   ??? Sexual activity: Yes   Other Topics Concern   ??? Military Service Not Asked   ??? Blood Transfusions Yes   ??? Caffeine Concern Not Asked   ??? Occupational Exposure Not Asked   ??? Hobby Hazards Not Asked   ??? Sleep Concern Not Asked   ??? Stress Concern Not Asked   ??? Weight Concern Not Asked   ??? Special Diet Not Asked   ??? Back Care Not Asked   ??? Exercise No   ??? Bike Helmet Not Asked   ??? Seat Belt Not Asked   ??? Self-Exams Not Asked   Social History Narrative   ??? Not on file         Medications  MEDS    calcium acetate 1,334 mg Oral TID w/ meals   ceFAZolin  (ANCEF)  IV 1.25 g Intravenous Q12H*   cetirizine 5 mg Oral QAM8   dialysate w/2.5% dex (AMBU) 2 L Intraperitoneal PD x1 Bag   dialysate w/2.5% dex (AMBU) 6 L Intraperitoneal PD x2 Bags   heparin (porcine) 20-40 Units/kg Intravenous As Prescribed   icodextrin (EXTRANEAL AMBUFLEX) 2.5 L Intraperitoneal PD x1 Bag   iron sucrose (VENOFER) IV 100 mg Intravenous Q24H*   lidocaine 1 patch Topical QDAY   pantoprazole 40 mg Intravenous BID   senna/docusate 10 mL Oral BID   sevelamer carbonate 800 mg Oral TID w/ meals    IV MEDS  ??? heparin (porcine) 20,000 units/D5W 500 mL infusion (std conc)(premade) 2,200 Units/hr (01/18/17 0710)   ??? sodium chloride 0.9 %   infusion       Prn emu PRN, HYDROcodone/acetaminophen Q4H PRN 1 tablet at 01/17/17 0604, ondansetron (ZOFRAN) IV Q6H PRN 4 mg at 01/18/17 0719, tiZANidine Q8H PRN 4 mg at 01/17/17 1807     HOME MEDS  Prior to Admission Medications   Prescriptions Last Dose Informant Patient Reported? Taking?   amLODIPine (NORVASC) 10 mg tablet   Yes No   Sig: Take  10 mg by mouth daily.   calcitriol (ROCALTROL) 0.5 mcg capsule   Yes No   Sig: Take 1 mcg by mouth daily.   calcium acetate (PHOSLO) 667 mg capsule   Yes No   Sig: Take 1,334 mg by mouth three times daily with meals.   carvedilol (COREG) 25 mg tablet   Yes No   Sig: Take 25 mg by mouth twice daily with meals. Take with food. cetirizine (ZYRTEC) 5 mg tablet   Yes No   Sig: Take 5 mg by mouth every morning.   fosinopril (MONOPRIL) 40 mg tablet   Yes No   Sig: Take 40 mg by mouth daily.   polyethylene glycol 3350 (MIRALAX) 17 g packet   Yes No   Sig: Take 17 g by mouth daily as needed.   potassium chloride SR (K-DUR) 10 mEq tablet   Yes No   Sig: Take 10 mEq by mouth daily. Take with a meal and a full glass of water.      Facility-Administered Medications: None              Physical Exam        Vital Signs: Last Filed In 24 Hours Vital Signs: 24 Hour Range   BP: 140/92 (12/11 1233)  Temp: 36.7 ???C (98 ???F) (12/11 1559)  Pulse: 96 (12/11 1559)  Respirations: 20 PER MINUTE (12/11 1559)  SpO2: 100 % (12/11 1559)  O2 Delivery: None (Room Air) (12/11 1559) BP: (121-157)/(77-99)   Temp:  [36.7 ???C (98 ???F)-36.9 ???C (98.5 ???F)]   Pulse:  [83-96]   Respirations:  [18 PER MINUTE-22 PER MINUTE]   SpO2:  [97 %-100 %]   O2 Delivery: None (Room Air)   Intensity Pain Scale (Self Report): 0 (01/17/17 2149)      Vitals:    01/14/17 1335 01/14/17 1800 01/15/17 0600   Weight: 112 kg (246 lb 14.6 oz) 112 kg (246 lb 14.6 oz) 114.8 kg (253 lb 1.4 oz)       Intake/Output Summary (Last 24 hours) at 01/18/2017 1645  Last data filed at 01/18/2017 1000  Gross per 24 hour   Intake 16109 ml   Output 13893 ml   Net 877 ml        General:  Alert, cooperative, obese female  Head:  Normocephalic, without obvious abnormality, atraumatic  Eyes:  Conjunctivae/corneas clear.  PERRL, EOMs intact.  Neck:  Supple  Chest: port  Lungs:  CTAB anteriorly   Heart:    RRR; S1, S2 normal; No M/R/G  Abdomen:  Soft, non-tender; PD catheter, site clean and dry  Extremities:  Right thigh minimally tender to touch   Skin:   Dry skin; No rashes or lesions  Neurologic:  CNII - XII grossly intact.       Labs     Recent Labs      01/16/17   0408  01/17/17   0412  01/18/17   0343   NA  134*  133*  135*   K  3.7  3.6  3.4*   CL  96*  94*  94*   CO2  25  24  22    GAP  13*  15*  19* BUN  32*  34*  36*   CR  9.35*  11.49*  11.97*   GLU  110*  100  122*   CA  6.5*  6.0*  6.1*   ALBUMIN  3.1*  2.9*  3.2*   MG  1.9  1.9   --    PO4  7.0*  9.3*   --    TSH  1.050   --    --        Recent Labs      01/15/17   1855  01/15/17   2100  01/16/17   0005  01/16/17   0408  01/16/17   0606  01/16/17   1300  01/16/17   1944  01/17/17   0144  01/17/17   0412  01/17/17   2142  01/18/17   0343   WBC   --    --    --   7.4   --    --    --    --   6.4   --   6.4   HGB  7.6*   --   7.1*  7.1*  7.0*  7.7*   --    --   7.4*   --   7.9*   HCT  23.2*   --   22.3*  22.2*  21.7*  23.7*   --    --   22.8*   --   24.3*   PLTCT   --    --    --   169   --    --    --    --   174   --   203   PTT   --   81.9*   --    --   65.0*  73.1*  111.9*  111.0*   --   82.2*  89.8*   AST   --    --    --   16   --    --    --    --   11   --   24   ALT   --    --    --   10   --    --    --    --   5*   --   13   ALKPHOS   --    --    --   68   --    --    --    --   64   --   73      Estimated Creatinine Clearance: 9.1 mL/min (A) (based on SCr of 11.97 mg/dL (H)).  Vitals:    01/14/17 1335 01/14/17 1800 01/15/17 0600   Weight: 112 kg (246 lb 14.6 oz) 112 kg (246 lb 14.6 oz) 114.8 kg (253 lb 1.4 oz)    No results for input(s): PHART, PO2ART in the last 72 hours.    Invalid input(s): PC02A                        Radiology     Pertinent radiology reviewed.

## 2017-01-18 NOTE — Case Management (ED)
Case Management Progress Note    NAME:Debra Lam                          MRN: [id number]              DOB:1995-06-28          AGE: 21 y.o.  ADMISSION DATE: 01/12/2017             DAYS ADMITTED: LOS: 6 days      Today???s Date: 01/18/2017    Plan  Discharge to home with IV abx and home health    Interventions  ? Support      ? Info or Referral      ? Discharge Planning   Discharge Planning: Home Health, Home Infusion-[facility]TP[facility] - Pt to discharge to home with IV cefazolin 1.25 gram every 12 hours until 01/28/17.  NCM met with patient and patient agreeable to referral being sent to [facility].  Pt would also like referral sent to [facility].  Referral sent to both agencies.      Pt states she is planning on returning to [place] possibly at the end of week.  NCM reviewed the importance of patient remaining in area to continue IV abx treatment.  Pt states I will think about it but my plan is to leave and go to [facility] this week.  Pt states her brother and sister in law will be available to help and assist pt with IV abx.      ? Medication Needs      ? Financial      ? Legal      ? Other        Disposition  ? Expected Discharge Date    Expected Discharge Date: 01/21/17  ? Transportation   Does the patient need discharge transport arranged?: No  Transportation Name, Phone and Availability #1: Johny Drilling (brother) - [phone] or Asher Muir (sister-in-law) - [phone]  ? Next Level of Care (Acute Psych discharges only)      ? Discharge Disposition          Durable Medical Equipment      No service has been selected for the patient.      [place] Destination      No service has been selected for the patient.      [facility] - Selection Complete      Service Provider Request Status Selected Services Address Phone Number Fax Number    [facility] Selected Home Health Services [address] [place] [address], [place] [address] [phone] [facility] - Selection Complete      Service Provider Request Status Selected Services Address Phone Number Fax Number    *[facility] Selected [facility] [address], [place] [address] [phone]

## 2017-01-19 ENCOUNTER — Encounter: Admit: 2017-01-19 | Discharge: 2017-01-19 | Payer: MEDICARE

## 2017-01-19 ENCOUNTER — Inpatient Hospital Stay: Admit: 2017-01-19 | Discharge: 2017-01-19 | Payer: MEDICARE

## 2017-01-19 DIAGNOSIS — G4730 Sleep apnea, unspecified: ICD-10-CM

## 2017-01-19 DIAGNOSIS — F5089 Other specified eating disorder: ICD-10-CM

## 2017-01-19 DIAGNOSIS — I10 Essential (primary) hypertension: ICD-10-CM

## 2017-01-19 DIAGNOSIS — K279 Peptic ulcer, site unspecified, unspecified as acute or chronic, without hemorrhage or perforation: ICD-10-CM

## 2017-01-19 DIAGNOSIS — Z9009 Acquired absence of other part of head and neck: ICD-10-CM

## 2017-01-19 DIAGNOSIS — K611 Rectal abscess: ICD-10-CM

## 2017-01-19 DIAGNOSIS — F329 Major depressive disorder, single episode, unspecified: ICD-10-CM

## 2017-01-19 DIAGNOSIS — I2699 Other pulmonary embolism without acute cor pulmonale: ICD-10-CM

## 2017-01-19 DIAGNOSIS — N19 Unspecified kidney failure: Principal | ICD-10-CM

## 2017-01-19 LAB — CULTURE-BLOOD W/SENSITIVITY: Lab: POSITIVE mg/dL — ABNORMAL LOW (ref 0.3–1.2)

## 2017-01-19 LAB — CREATINE KINASE-CPK: Lab: 127 U/L (ref 21–215)

## 2017-01-19 LAB — COMPREHENSIVE METABOLIC PANEL: Lab: 135 MMOL/L — ABNORMAL LOW (ref >60)

## 2017-01-19 LAB — PTT (APTT)
Lab: 148 s — ABNORMAL HIGH (ref 24.0–36.5)
Lab: 74 s — ABNORMAL HIGH (ref 24.0–36.5)

## 2017-01-19 LAB — H. PYLORI AG, FECES: Lab: NEGATIVE ug/dL — ABNORMAL HIGH (ref 270–380)

## 2017-01-19 LAB — CBC AND DIFF: Lab: 6.8 K/UL — ABNORMAL LOW (ref 4.5–11.0)

## 2017-01-19 MED ORDER — PNEUMOC 13-VAL CONJ-DIP CR(PF) 0.5 ML IM SYRG
.5 mL | Freq: Once | INTRAMUSCULAR | 0 refills | Status: DC
Start: 2017-01-19 — End: 2017-01-19

## 2017-01-19 MED ORDER — CARVEDILOL 6.25 MG PO TAB
6.25 mg | ORAL_TABLET | Freq: Two times a day (BID) | ORAL | 0 refills | 90.00000 days | Status: AC
Start: 2017-01-19 — End: 2017-03-01
  Filled 2017-01-28 (×2): qty 60, 30d supply, fill #1

## 2017-01-19 MED ORDER — FLUCONAZOLE 150 MG PO TAB
150 mg | ORAL_TABLET | Freq: Every day | ORAL | 0 refills | 3.00000 days | Status: AC
Start: 2017-01-19 — End: 2017-01-19

## 2017-01-19 MED ORDER — HYDROCODONE-ACETAMINOPHEN 5-325 MG PO TAB
1 | ORAL | 0 refills | Status: DC | PRN
Start: 2017-01-19 — End: 2017-01-21
  Administered 2017-01-19 – 2017-01-21 (×6): 1 via ORAL

## 2017-01-19 MED ORDER — IOHEXOL 350 MG IODINE/ML IV SOLN
100 mL | Freq: Once | INTRAVENOUS | 0 refills | Status: CP
Start: 2017-01-19 — End: ?
  Administered 2017-01-20: 01:00:00 100 mL via INTRAVENOUS

## 2017-01-19 MED ORDER — FENTANYL CITRATE (PF) 50 MCG/ML IJ SOLN
12.5-25 ug | INTRAVENOUS | 0 refills | Status: DC | PRN
Start: 2017-01-19 — End: 2017-01-20
  Administered 2017-01-20: 12.75 ug via INTRAVENOUS
  Administered 2017-01-20 (×2): 12.5 ug via INTRAVENOUS

## 2017-01-19 MED ORDER — HYDROCODONE-ACETAMINOPHEN 5-325 MG PO TAB
1-2 | ORAL | 0 refills | Status: DC | PRN
Start: 2017-01-19 — End: 2017-01-19
  Administered 2017-01-19: 18:00:00 1 via ORAL

## 2017-01-19 MED ORDER — APIXABAN 5 MG PO TAB
5 mg | ORAL_TABLET | Freq: Two times a day (BID) | ORAL | 5 refills | Status: AC
Start: 2017-01-19 — End: 2017-01-28
  Filled 2017-01-19: qty 60, 30d supply

## 2017-01-19 MED ORDER — PANTOPRAZOLE 40 MG PO TBEC
40 mg | Freq: Two times a day (BID) | ORAL | 0 refills | Status: DC
Start: 2017-01-19 — End: 2017-01-29
  Administered 2017-01-19 – 2017-01-28 (×14): 40 mg via ORAL

## 2017-01-19 MED ORDER — SODIUM CHLORIDE 0.9 % IJ SOLN
50 mL | Freq: Once | INTRAVENOUS | 0 refills | Status: CP
Start: 2017-01-19 — End: ?
  Administered 2017-01-20: 01:00:00 50 mL via INTRAVENOUS

## 2017-01-19 MED ORDER — HYDROCODONE-ACETAMINOPHEN 5-325 MG PO TAB
1 | ORAL_TABLET | ORAL | 0 refills | 15.00000 days | Status: AC | PRN
Start: 2017-01-19 — End: 2017-01-28

## 2017-01-19 MED ORDER — FLU VACC QS2018-19 6MOS UP(PF) 60 MCG (15 MCG X 4)/0.5 ML IM SYRG
.5 mL | Freq: Once | INTRAMUSCULAR | 0 refills | Status: DC
Start: 2017-01-19 — End: 2017-01-19

## 2017-01-19 MED ORDER — MIDAZOLAM 1 MG/ML IJ SOLN
0 refills | Status: CP
Start: 2017-01-19 — End: ?
  Administered 2017-01-20: 04:00:00 0.5 mg via INTRAVENOUS
  Administered 2017-01-20: 03:00:00 1 mg via INTRAVENOUS

## 2017-01-19 MED ORDER — ICODEXTRIN  IN AMBUFLEX  2.5 L
2.5 L | INTRAPERITONEAL | 0 refills | Status: DC
Start: 2017-01-19 — End: 2017-01-20

## 2017-01-19 MED ORDER — FLUCONAZOLE 150 MG PO TAB
150 mg | Freq: Every day | ORAL | 0 refills | Status: DC
Start: 2017-01-19 — End: 2017-01-19

## 2017-01-19 MED ORDER — SODIUM CHLORIDE 0.9 % IV SOLP
1000 mL | INTRAVENOUS | 0 refills | Status: AC
Start: 2017-01-19 — End: ?
  Administered 2017-01-20 (×2): 1000 mL via INTRAVENOUS

## 2017-01-19 MED ORDER — NOREPINEPHRINE IV DRIP (STD CONC)
.01-.4 ug/kg/min | INTRAVENOUS | 0 refills | Status: DC
Start: 2017-01-19 — End: 2017-01-22
  Administered 2017-01-20 (×2): 0.04 ug/kg/min via INTRAVENOUS

## 2017-01-19 MED ORDER — PANTOPRAZOLE 40 MG PO TBEC
ORAL_TABLET | Freq: Two times a day (BID) | ORAL | 3 refills | 90.00000 days | Status: AC
Start: 2017-01-19 — End: 2017-01-28
  Filled 2017-01-19: qty 60, 30d supply

## 2017-01-19 MED ORDER — CALCIUM GLUCONATE 1GM/100ML NS MB+
1 g | Freq: Once | INTRAVENOUS | 0 refills | Status: CP
Start: 2017-01-19 — End: ?
  Administered 2017-01-20 (×2): 1 g via INTRAVENOUS

## 2017-01-19 MED ORDER — TIZANIDINE 4 MG PO TAB
4 mg | ORAL_TABLET | ORAL | 0 refills | Status: AC | PRN
Start: 2017-01-19 — End: 2017-01-28
  Filled 2017-01-19: qty 15, 5d supply

## 2017-01-19 MED ORDER — APIXABAN 5 MG PO TAB
5 mg | Freq: Two times a day (BID) | ORAL | 0 refills | Status: DC
Start: 2017-01-19 — End: 2017-01-20
  Administered 2017-01-19: 17:00:00 5 mg via ORAL

## 2017-01-19 MED ORDER — FENTANYL CITRATE (PF) 50 MCG/ML IJ SOLN
0 refills | Status: CP
Start: 2017-01-19 — End: ?
  Administered 2017-01-20 (×3): 25 ug via INTRAVENOUS

## 2017-01-19 MED ORDER — FLUCONAZOLE 150 MG PO TAB
150 mg | ORAL_TABLET | Freq: Every day | ORAL | 0 refills | 3.00000 days | Status: AC | PRN
Start: 2017-01-19 — End: 2017-01-28
  Filled 2017-01-19 (×2): qty 1, 1d supply

## 2017-01-19 MED ORDER — LIDOCAINE 5 % TP PTMD
1 | MEDICATED_PATCH | Freq: Every day | TOPICAL | 0 refills | 30.00000 days | Status: AC
Start: 2017-01-19 — End: 2017-01-28

## 2017-01-19 MED ORDER — CYCLOBENZAPRINE 5 MG PO TAB
5 mg | Freq: Three times a day (TID) | ORAL | 0 refills | Status: DC
Start: 2017-01-19 — End: 2017-01-29
  Administered 2017-01-19 – 2017-01-28 (×24): 5 mg via ORAL

## 2017-01-19 MED ORDER — CARVEDILOL 6.25 MG PO TAB
6.25 mg | Freq: Two times a day (BID) | ORAL | 0 refills | Status: DC
Start: 2017-01-19 — End: 2017-01-19
  Administered 2017-01-19: 17:00:00 6.25 mg via ORAL

## 2017-01-19 MED ORDER — FENTANYL CITRATE (PF) 50 MCG/ML IJ SOLN
12.5 ug | Freq: Once | INTRAVENOUS | 0 refills | Status: CP
Start: 2017-01-19 — End: ?
  Administered 2017-01-20: 08:00:00 12.5 ug via INTRAVENOUS

## 2017-01-19 MED ORDER — DIALYSATE W/2.5% DEX AMBUFLEX 6000ML
6 L | INTRAPERITONEAL | 0 refills | Status: DC
Start: 2017-01-19 — End: 2017-01-20

## 2017-01-19 MED ORDER — CEFAZOLIN IVPB
1.25 g | Freq: Two times a day (BID) | INTRAVENOUS | 0 refills | 8.00000 days | Status: AC
Start: 2017-01-19 — End: 2017-01-28

## 2017-01-19 NOTE — Other
Critical result or procedure called (document test and value, and read back):  Ptt 148.7  Time MD/NP Notified:  Follow heparin protocol   MD/NP Name:  Follow heparin protocol   MD/NP Response/Orders Given:  Follow Heparin protocol

## 2017-01-19 NOTE — Procedures
APD-Automated Peritoneal Dialysis    PT ID CHECKED yes    Start time 1830   Therapy time 9 hours     # of cycles 6             Fill volume 2000 ml   Total volume [id number] ml      Solution 2.5 % Last fill volume 1500      Last fill dextrose % 7.5      Tunnel: CLEAN AND DRY    Effluent: clear    Weight 115 kg    Last BM 01/18/17    PD catheter assessment in lines/draines patent

## 2017-01-19 NOTE — Progress Notes
Inpatient Progress Note        Name:  Debra Lam   Medical Record Number: [id number]          Account Number:  [id number]  Date Of Birth:  [birth year]                           Room and Bed Number -  BH4605/01  Today's Date:  01/19/2017  LOS: 7 days    Age: 21 years  female    Chief Complaint:  on 01/12/2017    Admitting diagnosis: CLABSI (central line-associated bloodstream infection)    Assessment     Principal Problem:    CLABSI (central line-associated bloodstream infection)  Active Problems:    ESRD (end stage renal disease) on dialysis (HCC)    Morbid obesity (HCC)    Sepsis (HCC)    Hyperkalemia    Infection due to parainfluenza virus 4    Encephalopathy    Tachycardia    HTN (hypertension)    OSA (obstructive sleep apnea)    Acute respiratory failure with hypoxia (HCC)    Anemia    Acute deep vein thrombosis (DVT) of femoral vein of right lower extremity (HCC)    Debra Lam???is a 21 y.o.???female???with h/o   - HTN,   - 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     who presented as a transfer from OSH for worsening renal failure and R thigh pain. To ICU for urgent HD and management. On???zosyn/zyvox???for sepsis, blood cx + for Gm + cocci. RVP + for parainfluenza. Renal consulted, ID and  IR consulted.     # acute abd pain   Patient complained of acute abdominal pain started this morning.  It was mild when I saw the patient early in the morning.  It was primarily in the right lower quadrant as well as in the right upper gluteal area with tenderness to palpation.  Patient had never similar pain before.  She has been vitally stable.  No nausea vomiting.  She had the peritoneal dialysis with no complication.  She did not had this pain when she had the peritoneal dialysis.  -The plan was to increase the frequency of pain medication and add muscle relaxant and reevaluate her after a few hours in the afternoon  -If pain persist, will do peritoneal culture  -Discussed with infection disease    # Sepsis: resolving  - due to infected femoral line first used for hemodialysis on 01/11/17   - cath tip Dec 7: staph Aureus   - [facility] Dec 5 from HD cath is positive for [facility] (pending)  - on zyvox and zosyn till Dec 10   - start cefazolin (discussed with ID)  - f/u cx   - Rx complicated by vaginal candidiasis. given diflucan 150 on Dec 11     # Acute DVT   - most likely due to infected line   - started on heparin on Dec 8   -  Discussed benefits risks of different options including filter placements. Decision was to try to cont heparin gtt and monitor Hb, if dropping may need filter.   - GI ok with AC. Pt has been stable for 2 days with PPI   - had bad reaction  to coumadin before and was treated with eliquis before ( however, I dont know what was her kid function at that time).   - appreciate hematology recs. Started on eliquis this am. Benefits risks explained in details all questions answered. She was educated to have CBC checked next week once discharged.     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   -  Had second transfusion on Dec 8  - had third transfusion on Dec 9.   - Anemia could be from menstruation vs CKD  - plan for EGD on Monday as pt had Hx of PUD due to NSAID  - PPI IV BID. Switch to PO on discharge.   - send stool ag for H pylori  - EGD report should nonbleeding duodenal erosions on Dec 10. Counseled NO nsaid.     # HTN   - hold anti HTN w sepsis   -Resume Coreg     # ESRD   - eval for PD is per NEphro   -Discussed with nephrology    Total floor/unit time (reviewing and writing notes, examining the patient, reviewing test results etc) spent was 35 minutes of which > 50% was spent in care coordination  and bedside counseling (explaining treatment options, disease processes, laboratory/imaging results, prognosis, risks and benefits of treatment options, medication side effects, importance of compliance with treatment, risk factor reduction, follow up with primary care physician).    This note  was created using  [employer], hence  some grammatical errors may still be present despite  editing at the time of the dictation, )    Plan:       ADVANCE DIET AS TOLERATED  ??? DIET RENAL DIALYSIS PATIENTS  ???  records from previous encounters have been reviewed .  ???  results, diagnoses, prognoses were reviewed with the patient    ??? Radiology, imaging, labs were reviewed and summarized as above.    ??? Benefits / risks were explained in details including bleeding from heparin and death.   ??? pt was educated, informed about the importance of compliance and instructed to follow up with the PCP, Nephro, GI   ???  discussed with RN, pharmacy, and SW in multidisciplinary round    ???  PT/ OT       Future Appointments   Date Time Provider Department Center   01/24/2017  8:15 AM Reubin Milan, MD [facility] [place]   01/24/2017  9:30 AM [facility]    02/09/2017 12:00 PM Cecilie Lowers, PhD NEUROPS None   02/24/2017  8:00 AM SURGERY VASCULAR LAB [facility]   02/24/2017  9:30 AM Gladstone Lighter, MD [facility]       Subjective:      Pt seen and Chart reviewed.   No issues overnight. Had BM with no blood.  Right lower quadrant abdominal pain.  Pain over the upper gluteal area on the right side    ROS:      A comprehensive 10-point ROS was negative except for new onset abdominal pain    Medications:    Scheduled Meds:    apixaban (ELIQUIS) tablet 5 mg 5 mg Oral BID   calcium acetate (PHOSLO) capsule 1,334 mg 1,334 mg Oral TID w/ meals   ceFAZolin (ANCEF) 1.25 g in dextrose 5% (D5W) 50 mL IVPB 1.25 g Intravenous  Q12H*   cetirizine (ZYRTEC) tablet 5 mg 5 mg Oral QAM8   heparin (porcine) injection 2,300-4,590 Units 20-40 Units/kg Intravenous As Prescribed   iron sucrose (VENOFER) injection 100 mg 100 mg Intravenous Q24H*   lidocaine (LIDODERM) 5 % topical patch 1 patch 1 patch Topical QDAY   pantoprazole DR (PROTONIX) tablet 40 mg 40 mg Oral BID(11-21)   senna/docusate (SENOKOT-S) solution 10 mL 10 mL Oral BID   sevelamer carbonate (RENVELA) tablet 800 mg 800 mg Oral TID w/ meals   Continuous Infusions:  ??? heparin (porcine) 20,000 units/D5W 500 mL infusion (std conc)(premade) 2,000 Units/hr (01/19/17 0756)   ??? sodium chloride 0.9 %   infusion       PRN and Respiratory Meds:emu PRN, HYDROcodone/acetaminophen Q4H PRN, ondansetron (ZOFRAN) IV Q6H PRN, tiZANidine Q8H PRN      Objective:                            Vital Signs: Last Filed                 Vital Signs: 24 Hour Range   BP: 136/81 (12/12 0743)  Temp: 36.3 ???C (97.4 ???F) (12/12 0743)  Pulse: 84 (12/12 0743)  Respirations: 20 PER MINUTE (12/12 0743)  SpO2: 100 % (12/12 0743)  O2 Delivery: None (Room Air) (12/12 0743) BP: (136-148)/(81-94)   Temp:  [36.3 ???C (97.4 ???F)-36.8 ???C (98.3 ???F)]   Pulse:  [81-98]   Respirations:  [18 PER MINUTE-20 PER MINUTE]   SpO2:  [93 %-100 %]   O2 Delivery: None (Room Air)   Intensity Pain Scale (Self Report): 8 (01/19/17 4166) Vitals:    01/15/17 0600 01/19/17 0400   Weight: 114.8 kg (253 lb 1.4 oz) 114 kg (251 lb 5.2 oz)         Intake/Output Summary:  (Last 24 hours)    Intake/Output Summary (Last 24 hours) at 01/19/2017 0630  Last data filed at 01/19/2017 0800  Gross per 24 hour   Intake 16010 ml   Output 15750 ml   Net -339 ml      Stool Occurrence: 0    Physical Exam    General: Patient seems to be clinically stable and in no immediate distress. AAO x3   HEENT: NC/AT  Heart: S1 S2 present, RRR, no audible rubs or murmurs heard  Lungs: Bilateral air entry noted, normal breath sounds and no additional sounds heard  Abdomen: Soft, no epigastric tenderness and no masses noted. Bowel sounds were normal. PD in place.  Tenderness to palpation over the right lower quadrant as well as over the right upper gluteal area laterally.  Pain did not get worse with flexing the knee  Extremities: no pedal edema; no calf tenderness.resolution in Tenderness over the R thigh.   Skin: No obvious skin rashes   Neurologic: Seems oriented. No obvious abnormality of cranial nerves, motor system noted      Labs reviwed, Notable for:        Recent Labs      01/17/17   0412  01/18/17   0343  01/19/17   0420   NA  133*  135*  135*   K  3.6  3.4*  3.5   CL  94*  94*  94*   CO2  24  22  25    GAP  15*  19*  16*   BUN  34*  36*  34*   CR  11.49*  11.97*  13.58*   GLU  100  122*  97   CA  6.0*  6.1*  6.1*   ALBUMIN  2.9*  3.2*  3.1*   MG  1.9   --    --    PO4  9.3*   --    --        Recent Labs      01/16/17   1300  01/16/17   1944  01/17/17   0144  01/17/17   0412  01/17/17   2142  01/18/17   0343  01/19/17   0420  01/19/17   0650   WBC   --    --    --   6.4   --   6.4  6.8   --    HGB  7.7*   --    --   7.4*   --   7.9*  7.8*   --    HCT  23.7*   --    --   22.8*   --   24.3*  24.0*   --    PLTCT   --    --    --   174   --   203  209   --    PTT  73.1*  111.9*  111.0*   --   82.2*  89.8*  148.7*  74.7*   AST   --    --    --   11   --   24  42*   --    ALT   --    --    --   5*   --   13  20   --    ALKPHOS   --    --    --   64   --   73  81   --       Estimated Creatinine Clearance: 8 mL/min (A) (based on SCr of 13.58 mg/dL (H)).  Vitals:    01/15/17 0600 01/19/17 0400   Weight: 114.8 kg (253 lb 1.4 oz) 114 kg (251 lb 5.2 oz)    No results for input(s): PHART, PO2ART in the last 72 hours.    Invalid input(s): PC02A    BNP:   No results for input(s): BNP in the last 72 hours.  CARDIAC ENZYMES: No results for input(s): CKMB in the last 72 hours.    Glucose: 97 (01/19/17 0420)       Microbiology - Resulted Micro Last 72 Hrs      CULTURE-BLOOD W/SENSITIVITY  Resulted: 01/19/17 0708, Result status: Preliminary result   Ordering provider:  Dionne Ano, MD  01/13/17 2102 Resulting lab:  [place] [facility] Specimen Information    Source Collected On   Blood 01/13/17 2230          Components    Component Value Flag   Battery Name BLOOD CULTURE  ???   Specimen Description --  ???   Result:       BLOOD  LEFT  ANTECUBITAL     Special Requests NONE  ???   Culture NO GROWTH 5 DAYS  ???   Report Status --  ???            CULTURE-BLOOD W/SENSITIVITY  Resulted: 01/19/17 0708, Result status: Preliminary result   Ordering provider:  Dionne Ano, MD  01/13/17 2102 Resulting lab:  [place] [facility]    Specimen Information  Source Collected On   Blood 01/13/17 2315          Components    Component Value Flag   Battery Name BLOOD CULTURE  ???   Specimen Description --  ???   Result:       BLOOD  RIGHT  SC PORT     Special Requests NONE  ???   Culture NO GROWTH 5 DAYS  ???   Report Status --  ???            CULTURE-BLOOD W/SENSITIVITY  Resulted: 01/19/17 0708, Result status: Preliminary result   Ordering provider:  Barron Alvine, APRN  01/14/17 0716 Resulting lab:  [place] MA[facility] LAB    Specimen Information    Source Collected On   Blood 01/14/17 1335          Components    Component Value Flag   Battery Name BLOOD CULTURE  ???   Specimen Description --  ???   Result:       BLOOD  DIALYSIS CATHER     Special Requests NONE  ???   Culture NO GROWTH 5 DAYS  ???   Report Status --  ???            CULTURE-BLOOD W/SENSITIVITY  Resulted: 01/19/17 0708, Result status: Preliminary result   Ordering provider:  Barron Alvine, APRN  01/14/17 1616 Resulting lab:  [place] [facility] LAB    Specimen Information    Source Collected On   Blood 01/14/17 2015          Components    Component Value Flag   Battery Name BLOOD CULTURE  ???   Specimen Description --  ???   Result:       BLOOD  R CHEST PORT     Special Requests NONE  ???   Culture NO GROWTH 5 DAYS  ???   Report Status --  ???            CULTURE-BLOOD W/SENSITIVITY  Resulted: 01/19/17 0708, Result status: Preliminary result   Ordering provider:  Vira Browns, MD  01/17/17 [phone] Resulting lab:  [place] [facility] Specimen Information    Source Collected On   Blood 01/17/17 1040          Components    Component Value Flag   Battery Name BLOOD CULTURE  ???   Specimen Description --  ???   Result:       BLOOD  RIGHT  PORT     Special Requests NONE  ???   Culture NO GROWTH 2 DAYS  ???   Report Status --  ???            CULTURE-BLOOD W/SENSITIVITY  Resulted: 01/19/17 0708, Result status: Preliminary result   Ordering provider:  Vira Browns, MD  01/17/17 [phone] Resulting lab:  [place] [facility]IN LAB    Specimen Information    Source Collected On   Blood 01/17/17 1045          Components    Component Value Flag   Battery Name BLOOD CULTURE  ???   Specimen Description --  ???   Result:       BLOOD  LEFT  ANTECUBITAL  aerobic bottle only     Special Requests --  ???   Result:       Culture performed on specimen with less than the recommended volume of 10   ml/bottle. Decreased volume will affect sensitivity of culture.     Culture NO GROWTH 2  DAYS  ???   Report Status --  ???            CULTURE-BLOOD W/SENSITIVITY  Resulted: 01/19/17 0017, Result status: Final result   Ordering provider:  Adrian Prince, DO  01/12/17 2257 Resulting lab:  [place] MAIN LAB    Specimen Information    Source Collected On   Blood 01/13/17 0917          Components    Component Value Flag   Battery Name BLOOD CULTURE  ???   Specimen Description --  ???   Result:       BLOOD  RIGHT  CHEST   PORT     Special Requests NONE  ???   Culture --  ???   Result:       POSITIVE SMEAR:  GRAM POSITIVE COCCI RESEMBLING STREPTOCOCCI  one bottle only  CRITICAL VALUE CALLED TO AND READ BACK BY/TIME/TECH  Tuyen K/ 1548 12.7.18/ND  STREPTOCOCCUS AGALACTIAE(GROUP B)  Susceptibility testing of beta lactams is not necessary for clinical purposes   since resistant strains have not been recognized  See previous susceptibility     Report Status --  ???   Result:       FINAL  01/19/2017              CULTURE-BLOOD W/SENSITIVITY (Abnormal)  Resulted: 01/18/17 0024, Result status: Final result Ordering provider:  Adrian Prince, DO  01/12/17 2257 Resulting lab:  [place] MAIN LAB    Specimen Information    Source Collected On   Blood 01/12/17 2310          Components    Component Value Flag   Battery Name BLOOD CULTURE  ???   Specimen Description --  ???   Result:       BLOOD  RIGHT  HD CATH     Special Requests NONE  ???   Culture --  A   Result:       POSITIVE SMEAR:  GRAM POSITIVE COCCI RESEMBLING STREPTOCOCCI  one bottle only  CRITICAL VALUE CALLED TO AND READ BACK BY/TIME/TECH  MAKENZIE S/1736 12.6.18/MC  STREPTOCOCCUS AGALACTIAE(GROUP B)  Susceptibility testing of beta lactams is not necessary for clinical purposes   since resistant strains have not been recognized     Report Status --  ???   Result:       FINAL  01/18/2017     Organism ID --  ???   Result:       STREPTOCOCCUS AGALACTIAE(GROUP B)  Susceptibility testing of beta lactams is not necessary for clinical purposes   since resistant strains have not been recognized            Sensitivities     Organism Antibiotic Sensitivity    Streptococcus agalactiae(group b) susceptibility testing of beta lactams is not necessary for clinical purposes  since resistant strains have not been recognized Levofloxacin SUSCEPTIBLE Susceptible    KIRBY BAUER      Streptococcus agalactiae(group b) susceptibility testing of beta lactams is not necessary for clinical purposes  since resistant strains have not been recognized Method KIRBY BAUER     Liana Gerold

## 2017-01-19 NOTE — Progress Notes
Assumed care at 1900. Assessments completed and documented. Orders, labs, MAR reviewed. No acute changes throughout the night. VSS     Mentation: A&Ox4; calm & cooperative; follows commands appropriately.     Cardiac: S1; S2; palpable pulses; med-surg status    Respiratory: patient's breathing is non-labored and symmetrical; clear in apices and decreased in bases.     GI/GU: voids, last BM: 01/18/17    Nutrition: patient is on a regular diet, tolerates.     Activity: up ad lib     Pain: patient did  c/o pain through out the night in abdomen .    Will continue to monitor and pass along to oncoming RN.

## 2017-01-19 NOTE — Progress Notes
Pharmacy Anticoagulation Teaching    Debra Lam was provided with both verbal and written drug information about Apixaban. Discussion with the patient included: the medication regimen, dosing, monitoring, possible adverse effects, food/drug interactions to be aware of and OTC/herbal medication use. Emphasis was placed on the importance of medication compliance. The patient was also encouraged to contact the pharmacist with any further questions.    Margrett RudStephanie Gianina Olinde, [facility] Center[facility]ARMD  01/19/2017

## 2017-01-20 ENCOUNTER — Encounter: Admit: 2017-01-20 | Discharge: 2017-01-20 | Payer: MEDICARE

## 2017-01-20 DIAGNOSIS — T80211A Bloodstream infection due to central venous catheter, initial encounter: Principal | ICD-10-CM

## 2017-01-20 LAB — CBC
Lab: 12 K/UL — ABNORMAL HIGH (ref >60)
Lab: 18 % — ABNORMAL HIGH (ref 11–15)
Lab: 240 10*3/uL (ref 150–400)
Lab: 32 g/dL (ref 32.0–36.0)
Lab: 6.1 g/dL — ABNORMAL LOW (ref 12.0–15.0)
Lab: 7.7 FL (ref 7–11)
Lab: 89 FL (ref 80–100)
Lab: 18 10*3/uL — ABNORMAL HIGH (ref 4.5–11.0)
Lab: 19 % — ABNORMAL HIGH (ref 11–15)
Lab: 2.1 M/UL — ABNORMAL LOW (ref 4.0–5.0)
Lab: 29 pg (ref 26–34)
Lab: 32 g/dL (ref 32.0–36.0)
Lab: 6.2 g/dL — ABNORMAL LOW (ref 12.0–15.0)
Lab: 89 FL (ref 80–100)
Lab: 18 10*3/uL — ABNORMAL HIGH (ref 4.5–11.0)
Lab: 3 M/UL — ABNORMAL LOW (ref 4.0–5.0)

## 2017-01-20 LAB — CULTURE-BLOOD W/SENSITIVITY

## 2017-01-20 LAB — BASIC METABOLIC PANEL
Lab: 137 MMOL/L (ref 137–147)
Lab: 3.8 MMOL/L (ref 3.5–5.1)
Lab: 4 mL/min — ABNORMAL LOW (ref >60)
Lab: 4 mL/min — ABNORMAL LOW (ref >60)
Lab: 124 mg/dL — ABNORMAL HIGH (ref 70–100)
Lab: 135 MMOL/L — ABNORMAL LOW (ref 137–147)
Lab: 135 MMOL/L — ABNORMAL LOW (ref 137–147)
Lab: 15 mg/dL — ABNORMAL HIGH (ref 0.4–1.00)
Lab: 19 mg/dL — ABNORMAL HIGH (ref 3–12)
Lab: 20 MMOL/L — ABNORMAL LOW (ref 21–30)
Lab: 3 mL/min — ABNORMAL LOW (ref >60)
Lab: 4 mL/min — ABNORMAL LOW (ref >60)
Lab: 4.6 MMOL/L — ABNORMAL LOW (ref 3.5–5.1)
Lab: 42 mg/dL — ABNORMAL HIGH (ref 7–25)
Lab: 6.5 mg/dL — ABNORMAL LOW (ref >60)
Lab: 96 MMOL/L — ABNORMAL LOW (ref 98–110)

## 2017-01-20 LAB — PROTIME INR (PT)
Lab: 1.3 MMOL/L — ABNORMAL HIGH (ref 0.8–1.2)
Lab: 1.3 MMOL/L — ABNORMAL HIGH (ref 0.8–1.2)

## 2017-01-20 LAB — LACTIC ACID (BG - RAPID LACTATE)
Lab: 2.6 MMOL/L — ABNORMAL HIGH (ref 0.5–2.0)
Lab: 2 MMOL/L (ref 0.5–2.0)

## 2017-01-20 LAB — IONIZED CALCIUM
Lab: 0.7 MMOL/L — ABNORMAL LOW (ref 1.0–1.3)
Lab: 0.7 MMOL/L — ABNORMAL LOW (ref 1.0–1.3)
Lab: 0.8 MMOL/L — ABNORMAL LOW (ref 1.0–1.3)
Lab: 0.9 MMOL/L — ABNORMAL LOW (ref 1.0–1.3)

## 2017-01-20 LAB — GRAM STAIN

## 2017-01-20 LAB — CELL COUNT W/DIFF-FLUIDS
Lab: 14 %
Lab: 14 %
Lab: 30 /uL
Lab: 72 %
Lab: <10 /uL

## 2017-01-20 LAB — POC GLUCOSE
Lab: 108 mg/dL — ABNORMAL HIGH (ref 70–100)
Lab: 129 mg/dL — ABNORMAL HIGH (ref 70–100)

## 2017-01-20 LAB — COMPREHENSIVE METABOLIC PANEL
Lab: 134 MMOL/L — ABNORMAL LOW (ref 137–147)
Lab: 134 mg/dL — ABNORMAL HIGH (ref 70–100)
Lab: 15 U/L (ref 7–56)
Lab: 3 mL/min — ABNORMAL LOW (ref >60)
Lab: 37 mg/dL — ABNORMAL HIGH (ref >60)
Lab: 4 mL/min — ABNORMAL LOW (ref >60)
Lab: 17 — ABNORMAL HIGH (ref 3–12)
Lab: 131 MMOL/L — ABNORMAL LOW (ref 137–147)

## 2017-01-20 LAB — TROPONIN-I
Lab: 0 ng/mL (ref 0.0–0.05)
Lab: 0 ng/mL (ref 0.0–0.05)

## 2017-01-20 LAB — PTT (APTT)
Lab: 31 s (ref 24.0–36.5)
Lab: 30 s — ABNORMAL LOW (ref 24.0–36.5)

## 2017-01-20 LAB — MAGNESIUM
Lab: 1.8 mg/dL — ABNORMAL HIGH (ref 1.6–2.6)
Lab: 1.8 mg/dL — ABNORMAL HIGH (ref >60)

## 2017-01-20 LAB — PHOSPHORUS: Lab: 10 mg/dL — ABNORMAL HIGH (ref 2.0–4.5)

## 2017-01-20 LAB — CBC AND DIFF: Lab: 16 K/UL — ABNORMAL HIGH (ref 4.5–11.0)

## 2017-01-20 LAB — LACTIC ACID(LACTATE): Lab: 0.9 MMOL/L — ABNORMAL LOW (ref 0.5–2.0)

## 2017-01-20 MED ORDER — IOPAMIDOL 61 % IV SOLN
40 mL | Freq: Once | INTRAVENOUS | 0 refills | Status: CP
Start: 2017-01-20 — End: ?
  Administered 2017-01-20: 20:00:00 40 mL via INTRAVENOUS

## 2017-01-20 MED ORDER — SODIUM CHLORIDE 0.9 % IV SOLP
250 mL | INTRAVENOUS | 0 refills | Status: AC | PRN
Start: 2017-01-20 — End: ?

## 2017-01-20 MED ORDER — MIDAZOLAM 1 MG/ML IJ SOLN
0 refills | Status: CP
Start: 2017-01-20 — End: ?
  Administered 2017-01-20 (×2): 1 mg via INTRAVENOUS

## 2017-01-20 MED ORDER — MIDAZOLAM 1 MG/ML IJ SOLN
1 mg | Freq: Once | INTRAVENOUS | 0 refills | Status: CP
Start: 2017-01-20 — End: ?
  Administered 2017-01-20: 19:00:00 1 mg via INTRAVENOUS

## 2017-01-20 MED ORDER — INSULIN REGULAR HUMAN(#) 1 UNIT/ML IJ SYRINGE
10 [IU] | Freq: Once | INTRAVENOUS | 0 refills | Status: CP
Start: 2017-01-20 — End: ?
  Administered 2017-01-20: 12:00:00 10 [IU] via INTRAVENOUS

## 2017-01-20 MED ORDER — FENTANYL CITRATE (PF) 50 MCG/ML IJ SOLN
0 refills | Status: CP
Start: 2017-01-20 — End: ?
  Administered 2017-01-20 (×2): 50 ug via INTRAVENOUS

## 2017-01-20 MED ORDER — SODIUM BICARBONATE 1 MEQ/ML (8.4 %) IV SOLN
50 meq | Freq: Once | INTRAVENOUS | 0 refills | Status: CP
Start: 2017-01-20 — End: ?
  Administered 2017-01-20: 12:00:00 50 meq via INTRAVENOUS

## 2017-01-20 MED ORDER — LORAZEPAM 2 MG/ML IJ SOLN
.5 mg | INTRAVENOUS | 0 refills | Status: CP | PRN
Start: 2017-01-20 — End: ?
  Administered 2017-01-20 – 2017-01-21 (×4): 0.5 mg via INTRAVENOUS

## 2017-01-20 MED ORDER — ANTICOAG SODIUM CITRATE SOLN 5ML
1-5 mL | Freq: Once | 0 refills | Status: CP
Start: 2017-01-20 — End: ?
  Administered 2017-01-20: 20:00:00 4.2 mL

## 2017-01-20 MED ORDER — SODIUM CHLORIDE 0.9 % IV SOLP
300 mL | INTRAVENOUS | 0 refills | Status: AC | PRN
Start: 2017-01-20 — End: ?

## 2017-01-20 MED ORDER — CALCIUM GLUCONATE 1GM/100ML NS MB+
1 g | Freq: Once | INTRAVENOUS | 0 refills | Status: CP
Start: 2017-01-20 — End: ?
  Administered 2017-01-20 (×2): 1 g via INTRAVENOUS

## 2017-01-20 MED ORDER — CALCIUM GLUCONATE 1GM/100ML NS MB+
1 g | Freq: Once | INTRAVENOUS | 0 refills | Status: CP
Start: 2017-01-20 — End: ?
  Administered 2017-01-20 (×2): 1 g via INTRAVENOUS

## 2017-01-20 MED ORDER — FENTANYL CITRATE (PF) 50 MCG/ML IJ SOLN
25-50 ug | Freq: Once | INTRAVENOUS | 0 refills | Status: CP
Start: 2017-01-20 — End: ?
  Administered 2017-01-20: 19:00:00 50 ug via INTRAVENOUS

## 2017-01-20 MED ORDER — IOPAMIDOL 61 % IV SOLN
80 mL | Freq: Once | INTRA_ARTERIAL | 0 refills | Status: CP
Start: 2017-01-20 — End: ?
  Administered 2017-01-19: 16:00:00 80 mL via INTRA_ARTERIAL

## 2017-01-20 MED ORDER — FENTANYL CITRATE (PF) 50 MCG/ML IJ SOLN
25-50 ug | INTRAVENOUS | 0 refills | Status: DC | PRN
Start: 2017-01-20 — End: 2017-01-21
  Administered 2017-01-20: 16:00:00 25 ug via INTRAVENOUS
  Administered 2017-01-20 – 2017-01-21 (×7): 50 ug via INTRAVENOUS

## 2017-01-20 MED ORDER — SODIUM CHLORIDE 0.9 % IV SOLP
1000 mL | INTRAVENOUS | 0 refills | Status: AC | PRN
Start: 2017-01-20 — End: ?

## 2017-01-20 MED ORDER — SODIUM POLYSTYRENE SULFONATE 15 GRAM/60 ML PO SUSP
30 g | Freq: Once | ORAL | 0 refills | Status: DC
Start: 2017-01-20 — End: 2017-01-20

## 2017-01-20 MED ORDER — CALCIUM CHLORIDE IVPB
1 g | Freq: Once | INTRAVENOUS | 0 refills | Status: CP
Start: 2017-01-20 — End: ?
  Administered 2017-01-20: 14:00:00 1 g via INTRAVENOUS

## 2017-01-20 MED ORDER — DEXTROSE 50 % IN WATER (D50W) IV SOLP
25 g | Freq: Once | INTRAVENOUS | 0 refills | Status: CP
Start: 2017-01-20 — End: ?
  Administered 2017-01-20: 12:00:00 50 mL via INTRAVENOUS

## 2017-01-20 MED ORDER — ANTICOAG SODIUM CITRATE SOLN 5ML
1-5 mL | Freq: Once | 0 refills | Status: AC
Start: 2017-01-20 — End: ?

## 2017-01-20 MED ORDER — LORAZEPAM 2 MG/ML IJ SOLN
.5 mg | Freq: Once | INTRAVENOUS | 0 refills | Status: DC
Start: 2017-01-20 — End: 2017-01-21

## 2017-01-20 MED ADMIN — LORAZEPAM 2 MG/ML IJ SOLN [10467]: 0.5 mg | INTRAVENOUS | @ 16:00:00 | Stop: 2017-01-20 | NDC 00641604801

## 2017-01-20 NOTE — Telephone Encounter
Phone call from Dr. Sibyl Parrhapman DO, will cancel Dobutamine stress test for January 24, 2017 as patient is critically ill and in the ICU.

## 2017-01-20 NOTE — Case Management (ED)
Case Management Progress Note    NAME:Debra Ina HomesMonique Hain                          MRN: [id number]              DOB:03/29/1995          AGE: 21 y.o.  ADMISSION DATE: 01/12/2017             DAYS ADMITTED: LOS: 8 days      Todays Date: 01/20/2017    Plan  ICU care following RRT for hypotension, acute blood loss anemia  Discharge planning ongoing  ID, IR, Renal, Heme, wound care    Interventions  ? Support      ? Info or Referral     RNCM reviewed progress notes and discussed patient with ICU Team and SW.  ? Discharge Planning   Discharge Planning: Home Health, Home Infusion-[facility]   Dialysis needs-   ? Medication Needs    IV antibiotic on discharge  ? Financial      ? Legal      ? Other        Disposition  ? Expected Discharge Date    Expected Discharge Date: 01/24/17  ? Transportation   Does the patient need discharge transport arranged?: No  Transportation Name, Phone and Availability #1: Johny DrillingJermaine Murphy (brother) - [phone] or Asher MuirJamie (sister-in-law) - [phone]  ? Next Level of Care (Acute Psych discharges only)      ? Discharge Disposition                    Durable Medical Equipment      No service has been selected for the patient.      [place] Destination      No service has been selected for the patient.      [facility] - Selection Complete      Service Provider Request Status Selected Services Address Phone Number Fax Number    [facility] [address], [place] [id number] [phone]      [facility] - Selection Complete      Service Provider Request Status Selected Services Address Phone Number Fax Number    [facility] Selected Home [facility]fusion and Injection [address], [place] [id number] [phone]        Rae MarL Zilah Villaflor, RN MSN ONC  Nurse Case Manager  [phone]  X- [phone]

## 2017-01-20 NOTE — Progress Notes
Visit Start time: 4: 00  Visit End time: 4: 20     F/u from 8: 00 to 8: 17pm     I have spent total time of at least 37 minutes.  Today's visit was prolonged due to additional time spent      I reevaluated the patient in the afternoon.  The pain started being more diffuse compared to when I saw her first thing in the morning.  Despite increase in pain medication, the pain got worse.  Patient was asking for IV pain medication.  Orders was put in.  The plan was to proceed with abdominal CT with and without contrast.  Without contrast to rule out retroperitoneal hemorrhage.  With contrast to rule out abscess formation.    I followed up the CT imaging myself, called radiology department, they already informed my colleague that they found big retroperitoneal hemorrhage.  Patient only received 1 dose of Eliquis in the morning.  She was before that on heparin drip for several days.  Discussed with the nurse practitioner on call, she already put a stat consult for interventional radiology and a stat CBC.     If patient went hypovolemic, she needs more IV fluid resuscitation/transfusion [volume]  We will follow-up IR recommendation we will continue to follow up the patient tomorrow.

## 2017-01-20 NOTE — Progress Notes
Sedation physician present in room.  Recent vitals and patient condition reviewed between sedating physician and nurse.  Reassessment completed.  Determination made to proceed with planned sedation.

## 2017-01-20 NOTE — Other
Immediate Post Procedure Note    Date:  01/20/2017                                         Attending Physician:   B. Ree Kidauster, MD  Performing Provider:  Paschal DoppSilas H. Mayford KnifeWilliams, MD    Consent:  Consent obtained from patient.  Time out performed: Consent obtained, correct patient verified, correct procedure verified, correct site verified, patient marked as necessary.  Pre/Post Procedure Diagnosis:  ESRD and provoked right iliofemoral DVT  Indications:  ESRD and provoked right iliofemoral DVT    Anesthesia: Conscious Sedation  Procedure(s):  IVC filter placement and tunneled HD catheter placement  Findings:  Widely patent IVC. Successful infrarenal IVC filter placement and tunneled HD catheter via left common femoral vein.     Estimated Blood Loss:  Minimal  Specimen(s) Removed/Disposition:  None  Complications: None  Patient Tolerated Procedure: Well  Post-Procedure Condition:  stable    Darshan Solanki H. Mayford KnifeWilliams, MD  Pager [phone]

## 2017-01-20 NOTE — Other
Critical result or procedure called (document test and value, and read back):  Ca++ 5.6  Time MD/NP Notified:  2148  MD/NP Name:  Alfonso EllisGreco  MD/NP Response/Orders Given:  Will replace Calcium when pt. returns from IR.

## 2017-01-20 NOTE — Progress Notes
Renal Consult    Debra Lam  Admission Date:  01/12/2017         Assessment and Plan     Principal Problem:    CLABSI (central line-associated bloodstream infection)  Active Problems:    ESRD (end stage renal disease) on dialysis (HCC)    Morbid obesity (HCC)    HTN (hypertension)    OSA (obstructive sleep apnea)    Anemia    Acute deep vein thrombosis (DVT) of femoral vein of right lower extremity (HCC)        Debra Lam is a 21 y.o. 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 aureus growing from [facility] tip    Abdominal pain  ? peritonitis     DVT  - Partially occlusive acute appearing thrombus within the right external iliac and right common femoral veins    Severe right thigh pain - resolved following HD catheter removal  - CT abd and LE reviewed     History of parathyroidectomy  Hyperphosphatemia  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   - 2016 parathyroidectomy   - On phoslo    SOB  - positive for parainfluenza     H/o perirectal abscess 2016  H/o perineal abscess- req I&D/ IV abx 2017  HTN  OSA  Hx of PE  - 2016, likely provoked while on OCP  - Was on eliquis, not currently on anticoagulation    Iron deficiency anemia Tsat 10%, ferritin 610 likely due to acute infection    Recommendations:  - On treatment for line sepsis   - ID consulted; abx and duration per ID  - cont. PTA PD prescription tonight  - discussed re-placement of tunneled HD catheter with pt who has elected to cont. PD and does not want new HD line  - will cont. to follow while pt is inpt   - abd pain --> pt unable to tolerate PD tonight  - PD fluid sent for culture, cell count, and gram stain; ID following    pt discussed with Dr. Lou Miner, MD  Nephrology fellow  Pager [phone]      History     HPI: Debra Lam is a 21 y.o. female     with worsening abdominal pain that started last night. Reports bowel movement today.     She denies N/V/SOB.       Past Medical History   Past Medical History:   Diagnosis Date   ??? Depression    ??? H/O parathyroidectomy    ??? Hypertension    ??? Morbid obesity (HCC)    ??? Perirectal abscess    ??? Pica    ??? PUD (  peptic ulcer disease)    ??? Pulmonary embolism (HCC)    ??? Renal failure    ??? Sleep apnea        Past Surgical History:   Procedure Laterality Date   ??? KIDNEY TRANSPLANT  2009   ??? CENTRAL VENOUS CATHETER      hemodialysis catheter placement   ??? TUNNELED VENOUS PORT PLACEMENT         Family History  Family History   Problem Relation Age of Onset   ??? Hypertension Mother    ??? None Reported Brother          Social History   Social History     Socioeconomic History   ??? Marital status: Single     Spouse name: Not on file   ??? Number of children: 0   ??? Years of education: 53   ??? Highest education level: Not on file   Social Needs   ??? Financial resource strain: Not on file   ??? Food insecurity - worry: Not on file   ??? Food insecurity - inability: Not on file   ??? Transportation needs - medical: Not on file   ??? Transportation needs - non-medical: Not on file   Occupational History     Comment: unemployed   Tobacco Use   ??? Smoking status: Never Smoker   ??? Smokeless tobacco: Never Used   Substance and Sexual Activity ??? Alcohol use: No   ??? Drug use: No   ??? Sexual activity: Yes   Other Topics Concern   ??? Military Service Not Asked   ??? Blood Transfusions Yes   ??? Caffeine Concern Not Asked   ??? Occupational Exposure Not Asked   ??? Hobby Hazards Not Asked   ??? Sleep Concern Not Asked   ??? Stress Concern Not Asked   ??? Weight Concern Not Asked   ??? Special Diet Not Asked   ??? Back Care Not Asked   ??? Exercise No   ??? Bike Helmet Not Asked   ??? Seat Belt Not Asked   ??? Self-Exams Not Asked   Social History Narrative   ??? Not on file         Medications  MEDS    apixaban 5 mg Oral BID   calcium acetate 1,334 mg Oral TID w/ meals   ceFAZolin  (ANCEF)  IV 1.25 g Intravenous Q12H*   cetirizine 5 mg Oral QAM8   cyclobenzaprine 5 mg Oral TID   iron sucrose (VENOFER) IV 100 mg Intravenous Q24H*   lidocaine 1 patch Topical QDAY   pantoprazole DR 40 mg Oral BID(11-21)   senna/docusate 10 mL Oral BID   sevelamer carbonate 800 mg Oral TID w/ meals    IV MEDS    Prn emu PRN, fentaNYL citrate PF Q3H PRN 12.75 mcg at 01/19/17 1820, HYDROcodone/acetaminophen Q4H PRN 1 tablet at 01/19/17 1648, ondansetron (ZOFRAN) IV Q6H PRN 4 mg at 01/19/17 0655, tiZANidine Q8H PRN 4 mg at 01/19/17 0527     HOME MEDS  Prior to Admission Medications   Prescriptions Last Dose Informant Patient Reported? Taking?   amLODIPine (NORVASC) 10 mg tablet   Yes No   Sig: Take 10 mg by mouth daily.   calcitriol (ROCALTROL) 0.5 mcg capsule   Yes No   Sig: Take 1 mcg by mouth daily.   calcium acetate (PHOSLO) 667 mg capsule   Yes No   Sig: Take 1,334 mg by mouth three times daily with meals.   carvedilol (  COREG) 25 mg tablet   Yes No   Sig: Take 25 mg by mouth twice daily with meals. Take with food.   cetirizine (ZYRTEC) 5 mg tablet   Yes No   Sig: Take 5 mg by mouth every morning.   fosinopril (MONOPRIL) 40 mg tablet   Yes No   Sig: Take 40 mg by mouth daily.   polyethylene glycol 3350 (MIRALAX) 17 g packet   Yes No   Sig: Take 17 g by mouth daily as needed. potassium chloride SR (K-DUR) 10 mEq tablet   Yes No   Sig: Take 10 mEq by mouth daily. Take with a meal and a full glass of water.      Facility-Administered Medications: None              Physical Exam        Vital Signs: Last Filed In 24 Hours Vital Signs: 24 Hour Range   BP: 104/70 (12/12 1730)  Temp: 36.4 ???C (97.6 ???F) (12/12 1527)  Pulse: 84 (12/12 1730)  Respirations: 20 PER MINUTE (12/12 1527)  SpO2: 95 % (12/12 1730)  O2 Delivery: None (Room Air) (12/12 1730) BP: (100-148)/(52-96)   Temp:  [36.3 ???C (97.4 ???F)-36.8 ???C (98.3 ???F)]   Pulse:  [81-98]   Respirations:  [20 PER MINUTE]   SpO2:  [93 %-100 %]   O2 Delivery: None (Room Air)   Intensity Pain Scale (Self Report): 8 (01/19/17 1015)      Vitals:    01/15/17 0600 01/19/17 0400   Weight: 114.8 kg (253 lb 1.4 oz) 114 kg (251 lb 5.2 oz)       Intake/Output Summary (Last 24 hours) at 01/19/2017 1848  Last data filed at 01/19/2017 1800  Gross per 24 hour   Intake 30865 ml   Output 17740 ml   Net -1868 ml        General:  Alert, cooperative, obese female  Head:  Normocephalic, without obvious abnormality, atraumatic  Eyes:  Conjunctivae/corneas clear.  PERRL, EOMs intact.  Neck:  Supple  Chest: port  Lungs:  CTAB anteriorly   Heart:    RRR; S1, S2 normal; No M/R/G  Abdomen:  Soft, non-tender; PD catheter, site clean and dry  Extremities:  Right thigh minimally tender to touch   Skin:   Dry skin; No rashes or lesions  Neurologic:  CNII - XII grossly intact.       Labs     Recent Labs      01/17/17   0412  01/18/17   0343  01/19/17   0420   NA  133*  135*  135*   K  3.6  3.4*  3.5   CL  94*  94*  94*   CO2  24  22  25    GAP  15*  19*  16*   BUN  34*  36*  34*   CR  11.49*  11.97*  13.58*   GLU  100  122*  97   CA  6.0*  6.1*  6.1*   ALBUMIN  2.9*  3.2*  3.1*   MG  1.9   --    --    PO4  9.3*   --    --        Recent Labs      01/16/17   1944  01/17/17   0144  01/17/17   0412  01/17/17   2142  01/18/17   0343  01/19/17  1610  01/19/17   0650 WBC   --    --   6.4   --   6.4  6.8   --    HGB   --    --   7.4*   --   7.9*  7.8*   --    HCT   --    --   22.8*   --   24.3*  24.0*   --    PLTCT   --    --   174   --   203  209   --    PTT  111.9*  111.0*   --   82.2*  89.8*  148.7*  74.7*   AST   --    --   11   --   24  42*   --    ALT   --    --   5*   --   13  20   --    ALKPHOS   --    --   64   --   73  81   --       Estimated Creatinine Clearance: 8 mL/min (A) (based on SCr of 13.58 mg/dL (H)).  Vitals:    01/15/17 0600 01/19/17 0400   Weight: 114.8 kg (253 lb 1.4 oz) 114 kg (251 lb 5.2 oz)    No results for input(s): PHART, PO2ART in the last 72 hours.    Invalid input(s): PC02A                        Radiology     Pertinent radiology reviewed.

## 2017-01-20 NOTE — Progress Notes
At 2010 this RN talked with Lyman BishopKara Field covering MPF, informing this RN that Pt's CT revealed a renal hematoma with active hemorrhage. Lyman BishopKara Field stated that a rapid CBC had been ordered with a 1 L NS fluid bolus. This RN went in to Pt's room, awakened Pt, started full set of vitals along with drawing labs and starting rapid fluid bolus. Pt was responsive, A/O x 4, C/O right abdominal pain and requesting pain medication. Pt's BP was taken at 2014 to be 89/39. Next BP 5 minutes later was 80/39. This RN instructed Charge RN Shireen QuanMaddy Luther to call a rapid response. See rapid response note.

## 2017-01-20 NOTE — H&P (View-Only)
Pre Procedure History and Physical/Sedation Plan-OP    Procedure Date: 01/20/2017     Planned Procedure(s):  IVC Filter Placement and Tunneled HD Catheter Placement  Indication:  ESRD and provoked right iliofemoral dvt.    __________________________________________________________________    Chief Complaint:  ESRD and DVT    History of Present Illness: Debra Lam is a 21 y.o. female.  Admitted with bacteremia secondary to presumed right tunneled HD Catheter infection. Found to have provoked right iliofemoral DVT.  Catheter removed several days ago and patient treated with antibiotics.      Patient Active Problem List    Diagnosis Date Noted   ??? CLABSI (central line-associated bloodstream infection) 01/15/2017   ??? Acute deep vein thrombosis (DVT) of femoral vein of right lower extremity (HCC) 01/15/2017   ??? Morbid obesity (HCC) 01/13/2017   ??? HTN (hypertension) 01/13/2017   ??? OSA (obstructive sleep apnea) 01/13/2017   ??? Anemia 01/13/2017   ??? ESRD (end stage renal disease) on dialysis (HCC) 01/12/2017     Past Medical History:   Diagnosis Date   ??? Depression    ??? H/O parathyroidectomy    ??? Hypertension    ??? Morbid obesity (HCC)    ??? Perirectal abscess    ??? Pica    ??? PUD (peptic ulcer disease)    ??? Pulmonary embolism (HCC)    ??? Renal failure    ??? Sleep apnea       Past Surgical History:   Procedure Laterality Date   ??? KIDNEY TRANSPLANT  2009   ??? CENTRAL VENOUS CATHETER      hemodialysis catheter placement   ??? TUNNELED VENOUS PORT PLACEMENT        Medications Prior to Admission   Medication Sig Dispense Refill Last Dose   ??? [DISCONTINUED] amLODIPine (NORVASC) 10 mg tablet Take 10 mg by mouth daily.   01/12/2017   ??? calcitriol (ROCALTROL) 0.5 mcg capsule Take 1 mcg by mouth daily.   No fill hx   ??? calcium acetate (PHOSLO) 667 mg capsule Take 1,334 mg by mouth three times daily with meals.   No fill hx   ??? [DISCONTINUED] carvedilol (COREG) 25 mg tablet Take 25 mg by mouth twice daily with meals. Take with food.   01/12/2017   ??? cetirizine (ZYRTEC) 5 mg tablet Take 5 mg by mouth every morning.   01/12/2017   ??? Diphenhydramine-Acetaminophen (TYLENOL PM EXTRA STRENGTH) 25-500 mg tab tablet Take 2 tablets by mouth at bedtime as needed.   01/10/2017   ??? [DISCONTINUED] fosinopril (MONOPRIL) 40 mg tablet Take 40 mg by mouth daily.   01/12/2017   ??? [DISCONTINUED] HYDROcodone/acetaminophen (NORCO) 5/325 mg tablet Take 1 tablet by mouth every 4 hours as needed for Pain   01/12/2017   ??? polyethylene glycol 3350 (MIRALAX) 17 g packet Take 17 g by mouth daily as needed.   No fill hx   ??? potassium chloride SR (K-DUR) 10 mEq tablet Take 10 mEq by mouth daily. Take with a meal and a full glass of water.   01/12/2017     Allergies   Allergen Reactions   ??? Advair Diskus [Fluticasone-Salmeterol] ANAPHYLAXIS and HIVES   ??? Adhesive Tape (Rosins) RASH   ??? Coumadin [Warfarin] RASH     Bleeding lesions       Social History:   Social History     Tobacco Use   ??? Smoking status: Never Smoker   ??? Smokeless tobacco: Never Used   Substance Use Topics   ???  Alcohol use: No      Family History   Problem Relation Age of Onset   ??? Hypertension Mother    ??? None Reported Brother         Review of Systems  A comprehensive review of systems was negative.    Previous Anesthetic/Sedation History:  tolerated without complication.    Physical Exam:  Vital Signs: Last Filed In 24 Hours Vital Signs: 24 Hour Range   BP: 165/120 (12/13 1217)  Temp: 37.4 ???C (99.3 ???F) (12/13 1200)  Pulse: 136 (12/13 1217)  Respirations: 21 PER MINUTE (12/13 1217)  SpO2: 100 % (12/13 1217)  O2 Delivery: None (Room Air) (12/13 1217)  SpO2 Pulse: 136 (12/13 1217)  Height: 160 cm (62.99) (12/12 2300) BP: (76-165)/(39-120)   Temp:  [36.4 ???C (97.6 ???F)-37.6 ???C (99.7 ???F)]   Pulse:  [84-139]   Respirations:  [0 PER MINUTE-45 PER MINUTE]   SpO2:  [94 %-100 %]   O2 Delivery: None (Room Air)   Intensity Pain Scale (Self Report): Asleep (01/20/17 1145) General:  Alert, cooperative, no distress  Eyes: Conjunctivae clear, PERRL   Neck: No LAD, No JVD  Lungs:  symmetric chest movement, no distress  Chest wall:  No tenderness or deformity  Heart: RRR  Abdomen:  NT, ND  Extremities: no cyanosis or edema  Pulses: PT 2+ and symmetric    Airway:  airway assessment performed  Mallampati II (soft palate, uvula, fauces visible)   Anesthesia Classification:  ASA III (A patient with a severe systemic disease that limits activity, but is not incapacitating)  Sedation/Medication Plan:  Conscious sedation  Medications for Reversal: Flumazenil and naloxone PRN  Discussion/Reviews: Physician has discussed risks and alternatives of this type of sedation and above planned procedures with patient  NPO Status: Acceptable  Pregnancy Status: Not Pregnant    Lab/Other Diagnostic Tests:  Labs: Pertinent labs reviewed    Peter Garter MD

## 2017-01-20 NOTE — Progress Notes
Patient arrived to room 6305 via bed accompanied by RN. Patient transferred to the bed with assistance. Bedside safety checks completed. Initial patient assessment completed, refer to flowsheet for details. Admission skin assessment completed by:     Pressure Injury Present on Hospital Admission (within 24 hours): Yes    1. Occiput: No  2. Ear: No  3. Scapula: No  4. Spinous Process: No  5. Shoulder: No  6. Elbow: No  7. Iliac Crest: No  8. Sacrum/Coccyx: Yes  9. Ischial Tuberosity: No  10. Trochanter: No  11. Knee: No  12. Malleolus: No  13. Heel: No  14. Toes: No  15. Assessed for device associated injury Yes  16. Nursing Nutrition Assessment Completed Yes    See Doc Flowsheet for additional wound details.     INTERVENTIONS:   Q2 turns & wound team consult

## 2017-01-20 NOTE — Consults
Wound Ostomy Note    NAME:Debra Lam                                                                   MRN: [id number]                 DOB:07-27-1995          AGE: 21 y.o.  ADMISSION DATE: 01/12/2017             DAYS ADMITTED: LOS: 8 days      Reason for Consult/Visit: pressure injury Stage II or greater    Assessment/Plan:    Principal Problem:    CLABSI (central line-associated bloodstream infection)  Active Problems:    ESRD (end stage renal disease) on dialysis (HCC)    Morbid obesity (HCC)    HTN (hypertension)    OSA (obstructive sleep apnea)    Anemia    Acute deep vein thrombosis (DVT) of femoral vein of right lower extremity (HCC)      Pressure Injury 01/19/17 2315 No Upper;Inner;Left Buttocks Stage 2 (Active)   01/19/17 2315    Pressure Injury Present On Inpatient Admission: N   Pressure Injury Orientation: Upper;Inner;Left   Wound Location: Buttocks   Pressure Injury Stages: Stage 2   If this pressure injury is suspected to be device related, please select the device::    Agree With My Assessment?     Wound Dressing Status Open to Air    Wound Drainage Description     Wound Drainage Amount None 01/20/2017  9:00 AM   Wound Base Assessment Dry;Pink 01/20/2017  9:00 AM   Surrounding Skin Assessment Intact 01/20/2017  9:00 AM   Wound Length (cm) 2 cm 01/20/2017  9:00 AM   Wound Width (cm) 1 cm 01/20/2017  9:00 AM   Wound Depth (cm) 0.1 cm 01/20/2017  9:00 AM   Wound Surface Area (cm^2) 2 cm^2 01/20/2017  9:00 AM   Wound Volume (cm^3) 0.2 cm^3 01/20/2017  9:00 AM       Pressure Injury 01/19/17 2315 No Lower;Inner;Left Buttocks Stage 2 (Active)   01/19/17 2315    Pressure Injury Present On Inpatient Admission: N   Pressure Injury Orientation: Lower;Inner;Left   Wound Location: Buttocks   Pressure Injury Stages: Stage 2   If this pressure injury is suspected to be device related, please select the device::    Agree With My Assessment?     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    Wash affected areas with soap and water, make sure to pat dry well.  Apply thin layer of criticaid barrier cream BID and  PRN    Wound team will sign off    Berton Mount RN, BSN  Wound Ostomy Nursing Consult Service  [facility]  Office:  [phone]  Pager:  [phone]

## 2017-01-20 NOTE — Progress Notes
Renal Consult    Debra Lam  Admission Date:  01/12/2017         Assessment and Plan     Principal Problem:    CLABSI (central line-associated bloodstream infection)  Active Problems:    ESRD (end stage renal disease) on dialysis (HCC)    Morbid obesity (HCC)    HTN (hypertension)    OSA (obstructive sleep apnea)    Anemia    Acute deep vein thrombosis (DVT) of femoral vein of right lower extremity (HCC)        Debra Lam is a 21 y.o. 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 aureus growing from [facility] tip    Retroperitoneal bleed  - CT abd 12/12 with retroperitoneal bleed --> STAT IR: Right renal artery: Widely patent right renal artery. 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   - 2016 parathyroidectomy   - On phoslo    SOB - positive for parainfluenza     H/o perirectal abscess 2016  H/o perineal abscess- req I&D/ IV abx 2017  HTN  OSA  Hx of PE  - 2016, likely provoked while on OCP  - Was on eliquis, not currently on anticoagulation    Iron deficiency anemia  Tsat 10%, ferritin 610 likely due to acute infection    Recommendations:  - On treatment for line sepsis   - events of last night noted above  - ID following  - PD fluid cell count unremarkable   - pt with hyperkalemia today (s/p temporizing measures) --> significant abd pain and large retroperitoneal bleed compressing on intra-abdominal cavity --> pt unable to tolerate PD --> had IR place tunneled left thigh HD catheter and plan for 4hr HD today  - will reassess for HD needs tomorrow; suspect pt's next HD on Saturday   - given unclear etiology for spontaneous renal artery branches (on anticoagulation) --> would re-image with CT abd/pelvis in future once hemorrhage has resorbed to exclude underlying abnormalities, such as renal cell carcinoma     pt discussed with Dr. Lou Miner, MD  Nephrology fellow  Pager [phone]      History     HPI: Debra Lam is a 21 y.o. female  with worsening abdominal pain that started last night. Reports bowel movement today.     She denies N/V/SOB.       Past Medical History   Past Medical History:   Diagnosis Date   ??? Depression    ??? H/O parathyroidectomy    ??? Hypertension    ??? Morbid obesity (HCC)    ??? Perirectal abscess    ??? Pica    ??? PUD (peptic ulcer disease)    ??? Pulmonary embolism (HCC)    ??? Renal failure    ??? Sleep apnea        Past Surgical History:   Procedure Laterality Date   ??? KIDNEY TRANSPLANT  2009   ??? CENTRAL VENOUS CATHETER      hemodialysis catheter placement   ??? TUNNELED VENOUS PORT PLACEMENT         Family History  Family History   Problem Relation Age of Onset   ??? Hypertension Mother    ??? None Reported Brother          Social History   Social History     Socioeconomic History   ??? Marital status: Single Spouse name: Not on file   ??? Number of children: 0   ??? Years of education: 29   ??? Highest education level: Not on file   Social Needs   ??? Financial resource strain: Not on file   ??? Food insecurity - worry: Not on file   ??? Food insecurity - inability: Not on file   ??? Transportation needs - medical: Not on file   ??? Transportation needs - non-medical: Not on file   Occupational History     Comment: unemployed   Tobacco Use   ??? Smoking status: Never Smoker   ??? Smokeless tobacco: Never Used   Substance and Sexual Activity   ??? Alcohol use: No   ??? Drug use: No   ??? Sexual activity: Yes   Other Topics Concern   ??? Military Service Not Asked   ??? Blood Transfusions Yes   ??? Caffeine Concern Not Asked   ??? Occupational Exposure Not Asked   ??? Hobby Hazards Not Asked   ??? Sleep Concern Not Asked   ??? Stress Concern Not Asked   ??? Weight Concern Not Asked   ??? Special Diet Not Asked   ??? Back Care Not Asked   ??? Exercise No   ??? Bike Helmet Not Asked   ??? Seat Belt Not Asked   ??? Self-Exams Not Asked   Social History Narrative   ??? Not on file         Medications  MEDS    calcium acetate 1,334 mg Oral TID w/ meals   ceFAZolin  (ANCEF)  IV 1.25 g Intravenous Q12H*   cetirizine 5 mg Oral QAM8   cyclobenzaprine 5 mg Oral TID   iron sucrose (VENOFER) IV 100 mg Intravenous Q24H*   lidocaine 1 patch Topical QDAY   LORazepam  (ATIVAN)  injection 0.5 mg Intravenous ONCE   pantoprazole DR 40 mg Oral BID(11-21)   senna/docusate 10 mL Oral BID   sevelamer carbonate 800 mg Oral TID w/ meals    IV MEDS  ??? norepinephrine (LEVOPHED) 4 mg in dextrose 5% (D5W) 250 mL IV drip (std conc) Stopped (01/20/17 0535)   ??? sodium chloride 0.9 %   infusion 1,000 mL (01/19/17 2020)     Prn emu PRN, fentaNYL citrate PF Q3H PRN 50 mcg at 01/20/17 1509, HYDROcodone/acetaminophen Q4H PRN  1 tablet at 01/20/17 1555, LORazepam  (ATIVAN)  injection Q4H PRN 0.5 mg at 01/20/17 1509, ondansetron (ZOFRAN) IV Q6H PRN 4 mg at 01/20/17 0139, sodium chloride 0.9% (NS) IP Dialysis PRN, sodium chloride 0.9% (NS) IP Dialysis PRN, sodium chloride 0.9% (NS) IP Dialysis PRN, tiZANidine Q8H PRN 4 mg at 01/19/17 0527     HOME MEDS  Prior to Admission Medications   Prescriptions Last Dose Informant Patient Reported? Taking?   amLODIPine (NORVASC) 10 mg tablet   Yes No   Sig: Take 10 mg by mouth daily.   calcitriol (ROCALTROL) 0.5 mcg capsule   Yes No   Sig: Take 1 mcg by mouth daily.   calcium acetate (PHOSLO) 667 mg capsule   Yes No   Sig: Take 1,334 mg by mouth three times daily with meals.   carvedilol (COREG) 25 mg tablet   Yes No   Sig: Take 25 mg by mouth twice daily with meals. Take with food.   cetirizine (ZYRTEC) 5 mg tablet   Yes No   Sig: Take 5 mg by mouth every morning.   fosinopril (MONOPRIL) 40 mg tablet   Yes No   Sig: Take 40 mg by mouth daily.   polyethylene glycol 3350 (MIRALAX) 17 g packet   Yes No   Sig: Take 17 g by mouth daily as needed.   potassium chloride SR (K-DUR) 10 mEq tablet   Yes No   Sig: Take 10 mEq by mouth daily. Take with a meal and a full glass of water.      Facility-Administered Medications: None              Physical Exam        Vital Signs: Last Filed In 24 Hours Vital Signs: 24 Hour Range   BP: 157/119 (12/13 1645)  Temp: 37.1 ???C (98.8 ???F) (12/13 1400)  Pulse: 124 (12/13 1645)  Respirations: 25 PER MINUTE (12/13 1645)  SpO2: 98 % (12/13 1645)  O2 Delivery: None (Room Air) (12/13 1645)  SpO2 Pulse: 124 (12/13 1645)  Height: 160 cm (62.99) (12/12 2300) BP: (76-167)/(39-128)   Temp:  [36.9 ???C (98.4 ???F)-37.6 ???C (99.7 ???F)]   Pulse:  [84-139]   Respirations:  [0 PER MINUTE-45 PER MINUTE]   SpO2:  [87 %-100 %]   O2 Delivery: None (Room Air)   Intensity Pain Scale (Self Report): 6 (01/20/17 1510)      Vitals:    01/19/17 0400 01/19/17 2300 01/20/17 1604   Weight: 114 kg (251 lb 5.2 oz) 113 kg (249 lb 1.9 oz) 115.8 kg (255 lb 4.7 oz)       Intake/Output Summary (Last 24 hours) at 01/20/2017 1700  Last data filed at 01/20/2017 1604  Gross per 24 hour Intake 4031.13 ml   Output 2150 ml   Net 1881.13 ml        General:  Alert, cooperative, obese female  Head:  Normocephalic, without obvious abnormality, atraumatic  Eyes:  Conjunctivae/corneas clear.  PERRL, EOMs intact.  Neck:  Supple  Chest: port  Lungs:  CTAB anteriorly   Heart:    RRR; S1, S2 normal; No M/R/G  Abdomen:  diffusely tender to palpation; soft  Extremities: edema; LLE thigh tunneled HD  Skin:   Dry skin; No rashes or lesions  Neurologic:  CNII - XII grossly intact.       Labs     Recent Labs      01/18/17   [phone]  01/19/17   [phone]  01/19/17   2030  01/20/17   0345  01/20/17   0830  01/20/17   1124  01/20/17   1418   NA  135*  135*  134*  131*  137  135*  135*   K  3.4*  3.5  5.0  6.6*  3.8  4.2  4.6   CL  94*  94*  94*  95*  102  96*  96*   CO2  22  25  23   18*  20*  21  20*   GAP  19*  16*  17*  18*  15*  18*  19*   BUN  36*  34*  37*  39*  37*  42*  42*   CR  11.97*  13.58*  15.04*  14.37*  13.16*  14.56*  15.50*   GLU  122*  97  134*  116*  97  113*  124*   CA  6.1*  6.1*  5.6*  6.0*  5.2*  6.8*  6.5*   ALBUMIN  3.2*  3.1*  2.8*  2.9*   --    --    --    MG   --    --   1.8  1.8   --    --    --    PO4   --    --   10.1*   --    --    --    --        Recent Labs      01/17/17   2142  01/18/17   0343  01/19/17   0420  01/19/17   0650  01/19/17   2018  01/19/17   2030  01/19/17   2048  01/20/17   0027  01/20/17   0345  01/20/17   0830  01/20/17   1418   WBC   --   6.4  6.8   --   12.8*   --    --   18.3*   --   16.2*  18.3*   HGB   --   7.9*  7.8*   --   6.1*   --    --   6.2*   --   6.6*  9.1*   HCT   --   24.3*  24.0*   --   18.9*   --    --   19.0*   --   19.9*  26.8*   PLTCT   --   203  209   --   240   --    --   282   --   207  180   INR   --    --    --    --    --   1.3*   --    --   1.3*   --    --    PTT  82.2*  89.8*  148.7*  74.7*   --   31.0   --    --   30.1   --    --    AST   --   24  42*   --    --   39   --    --   40   --    -- ALT   --   13  20   --    --   15   --    --  12   --    --    ALKPHOS   --   73  81   --    --   72   --    --   75   --    --    TNI   --    --    --    --    --    --   0.01  0.01   --    --    --       Estimated Creatinine Clearance: 6.9 mL/min (A) (based on SCr of 15.5 mg/dL (H)).  Vitals:    01/19/17 0400 01/19/17 2300 01/20/17 1604   Weight: 114 kg (251 lb 5.2 oz) 113 kg (249 lb 1.9 oz) 115.8 kg (255 lb 4.7 oz)    No results for input(s): PHART, PO2ART in the last 72 hours.    Invalid input(s): PC02A                        Radiology     Pertinent radiology reviewed.

## 2017-01-20 NOTE — Progress Notes
Patient Problem called about: (document change in assessment or concern): BP 104/70  C/o dizziness  Time MD/NP Notified: 1700  MD/NP Name: DrMohamad  Continue to monitor    Interventions: coreg discontinue

## 2017-01-20 NOTE — Other
Immediate Post Procedure Note    Date:  01/19/2017                                         Attending Physician:   B. Ree Kidauster, MD  Performing Provider:  Paschal DoppSilas H. Mayford KnifeWilliams, MD    Consent:  Consent obtained from patient.  Time out performed: Consent obtained, correct patient verified, correct procedure verified, correct site verified, patient marked as necessary.  Pre/Post Procedure Diagnosis:  Right pararenal hematoma with active bleeding  Indications:  Right pararenal hematoma with active bleeding    Anesthesia: Local 5 mL 2% lidocaine without epinephrine with IV sedation versed and fentanyl  Procedure(s):  Aortogram and right renal arteriograms. Coil embolization bleeding right renal artery branches.  Findings:  Successful coil embolization from bleeding right renal artery branches to mid right renal artery. Right femoral artery access. Hemostasis achieved with angioseal closure device.       Estimated Blood Loss:  Minimal  Specimen(s) Removed/Disposition:  None  Complications: None  Patient Tolerated Procedure: Well  Post-Procedure Condition:  stable    Debra Sick H. Mayford KnifeWilliams, MD  Pager [phone]

## 2017-01-20 NOTE — Progress Notes
RT Adult Assessment Note    NAME:Debra Lam             MRN: [id number]             DOB:01/15/1996          AGE: 21 y.o.  ADMISSION DATE: 01/12/2017             DAYS ADMITTED: LOS: 8 days    RT Treatment Plan:       Protocol Plan: Procedures  IPPB: Place a nursing order for "IS Q1h While Awake" for any of Lung Expansion indicators  Oxygen/Humidity: Discontinued  Monitoring: Discontinued    Additional Comments:  Impressions of the patient:   Intervention(s)/outcome(s):   Patient education that was completed:   Recommendations to the care team:     Vital Signs:  Pulse: Pulse: 113  RR: Respirations: 14 PER MINUTE  SpO2: SpO2: 97 %  O2 Device: $$ O2 Device: Standby  Liter Flow:    O2%:    Breath Sounds:    Respiratory Effort:

## 2017-01-20 NOTE — Other
Critical result or procedure called (document test and value, and read back):  K+ 6.6  Time MD/NP Notified:  [email or web address]  MD/NP Name:  Luz BrazenGonder  MD/NP Response/Orders Given:  Text page; awaiting response.

## 2017-01-20 NOTE — Other
Critical result or procedure called (document test and value, and read back): Calcium 5.2  Time MD/NP Notified:  [email or web address]  MD/NP Name: [facility] and Nephrology at bedside for rounds  MD/NP Response/Orders Given: new orders for Ca Gluconate IVPB, plan for HD today

## 2017-01-20 NOTE — Procedures
1800 This RN has gone to the patients room to drain for cultures/gram stain/cell count after a dwell. Plan was to use the "initial drain" function of the APD to obtain the fluids. Patient though, has refused APD for tonight stating that it "hurts too bad."  Manual drain bag attached to PD catheter and 2000ml fluids drained and sent to the lab for the above labs.     Dr. Jennings BooksYu/Dr. Lester Carolinaorosyan contacted regarding the patients refusal. Order obtained to cancel APD for tonight. Manual dwell/drain complete.

## 2017-01-20 NOTE — Progress Notes
General Progress Note    Name:  Debra Lam   Today's Date:  01/20/2017  Admission Date: 01/12/2017  LOS: 8 days                     Assessment/Plan:    Principal Problem:    CLABSI (central line-associated bloodstream infection)  Active Problems:    ESRD (end stage renal disease) on dialysis (HCC)    Morbid obesity (HCC)    HTN (hypertension)    OSA (obstructive sleep apnea)    Anemia    Acute deep vein thrombosis (DVT) of femoral vein of right lower extremity [facility])    Debra Lam is a 21 year old African-American female, originally from [place] and visiting in [place], with past medical history of hypertension, peptic ulcer disease, pulmonary embolism, congenital hypoplastic kidney, status post kidney transplant complicated with rejection currently on hemodialysis, [facility] from outside facility because of prolonged abdominal hemodialysis catheter in the right groin.  Ultrasound of the right thigh was positive for acute onset of deep venous thrombosis, hematology service is consulted for recommendation and management of her new onset DVT and now following for her right retroperitoneal hemorrhage.    Right sided retroperitoneal hemorrhage in right pararenal space:  - No history of trauma, was on heparin gtt. Received one dose of eliquis 5 mg po  - Hb dropped significantly, received 4 u PRBC, Hb now is 9.1  - Heparin and eliquis discontinued    Hyperkalemia, hypocalcemia with ESRD:  - Hyperkalemia was aggressively treated and K is now 4.6  - Received both calcium chloride and calcium gluconate  - Will have HD/port placement by IR for dialysis today.  ???  Right external iliac and right common femoral vein thrombosis, partially occlusive, acute:  - Appears provocative due to presence of the HD catheter  - Had PE in 2017, was treated with coumadin but developed rash/hives, switched to eliquis which she used for 2-3 months (unsure of duration) - Was started on heparin gtt with a plan to transition to eliquis for long term use  - Had spontaneous bleeding in the retroperitoneal space, so AC discontinued and IVC filter placed by IR  ???  ???  RECOMMENDATIONS:  - Debra Lam unfortunately had spontaneous retroperitoneal hemorrhage causing RLQ pain since yesterday morning (01/19/2017) while she was on heparin gtt. She   received first dose of eliquis 5 mg at 10:41 am on 12/12, well after she was complaining of pain. Her aPTT was signtificanly high at 4:00 am on 01/19/17 and it was 148.7. We do not think that  she bled due to use of eliquis. Her bleeding was spontaneous and due to heparin.   - We agree with IVC filter placement. Once her bleeding is contained and stopped, and Hb is stable (approximately 7-10 days from now), we could resume eliquis with a reduced dose (2.5 mg po bid) for 7-10 days and it her Hb is stable, we could escalate to the full dose and monitor closely. Alternatively, we could challenge her again with heparin 7-10 days later with a very tight control of aPTT (goal of 60-80) and monitor her and transition to eliquis thereafter.  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  ???    Patient seen and plan of care was discussed with Dr. Female.  ???  Astrid Divine, MD PhD  Fellow,  Hematology/Oncology  Pager # [phone]  ________________________________________________________________________    Subjective  Debra Lam is a 21 y.o. female.  Patient had eventful yesterday and night. She was complaining of right sided abdominal pain since yesterday early morning, unresponsive to pain medication. He was on heparin gtt and received first dose of eliquis 5 mg at 10:41 am on 12/12. Later during the day, she became hypotensive and her Hb dropped. She had a CT abdomen that showed large right pararenal space hemorrhage with active contrast extravasation. The right native kidney is not clearly visualized due to the amount of surrounding complex fluid bleeding/hematoma. Underlying neoplasm cannot be excluded and follow-up imaging after resolution of hemorrhage needed. Peritoneal dialysis catheter in place with a small volume of ascites. Prominent external iliac and inguinal lymph nodes which are likely reactive. Rapid response was called for her hypotension and emergent IR procedure was requested and had coil embolization of right renal artery branch. She received 4 u of PRBC, was briefly on pressors and was moved to ICU. She did not have dialysis, her heparin and eliquis was discontinued.    When I saw her, her sister-in-law was at the bedside. Her mother is en-route from [place]. She reports of continued abdominal pain, but denies any CP or SOB. No headache or dizziness.    Medications  Scheduled Meds:  calcium acetate (PHOSLO) capsule 1,334 mg 1,334 mg Oral TID w/ meals   ceFAZolin (ANCEF) 1.25 g in dextrose 5% (D5W) 50 mL IVPB 1.25 g Intravenous Q12H*   cetirizine (ZYRTEC) tablet 5 mg 5 mg Oral QAM8   cyclobenzaprine (FLEXERIL) tablet 5 mg 5 mg Oral TID   iron sucrose (VENOFER) injection 100 mg 100 mg Intravenous Q24H*   lidocaine (LIDODERM) 5 % topical patch 1 patch 1 patch Topical QDAY   LORazepam (ATIVAN) injection 0.5 mg 0.5 mg Intravenous ONCE   pantoprazole DR (PROTONIX) tablet 40 mg 40 mg Oral BID(11-21)   senna/docusate (SENOKOT-S) solution 10 mL 10 mL Oral BID   sevelamer carbonate (RENVELA) tablet 800 mg 800 mg Oral TID w/ meals   Continuous Infusions:  ??? norepinephrine (LEVOPHED) 4 mg in dextrose 5% (D5W) 250 mL IV drip (std conc) Stopped (01/20/17 0535)   ??? sodium chloride 0.9 %   infusion 1,000 mL (01/19/17 2020)     PRN and Respiratory Meds:emu PRN, fentaNYL citrate PF Q3H PRN, HYDROcodone/acetaminophen Q4H PRN, LORazepam  (ATIVAN)  injection Q4H PRN, ondansetron (ZOFRAN) IV Q6H PRN, sodium chloride 0.9% (NS) IP Dialysis PRN, sodium chloride 0.9% (NS) IP Dialysis PRN, sodium chloride 0.9% (NS) IP Dialysis PRN, tiZANidine Q8H PRN      Review of Systems:  A 14 point review of systems was negative except for: positive for right sided abdominal pain    Objective:                          Vital Signs: Last Filed                 Vital Signs: 24 Hour Range   BP: 138/80 (12/13 1700)  Temp: 37.1 ???C (98.8 ???F) (12/13 1400)  Pulse: 122 (12/13 1700)  Respirations: 22 PER MINUTE (12/13 1700)  SpO2: 100 % (12/13 1700)  O2 Delivery: None (Room Air) (12/13 1700)  SpO2 Pulse: 124 (12/13 1645)  Height: 160 cm (62.99) (12/12 2300) BP: (76-167)/(39-128)   Temp:  [36.9 ???C (98.4 ???F)-37.6 ???C (99.7 ???F)]   Pulse:  [84-139]   Respirations:  [  0 PER MINUTE-45 PER MINUTE]   SpO2:  [87 %-100 %]   O2 Delivery: None (Room Air)   Intensity Pain Scale (Self Report): 6 (01/20/17 1510) Vitals:    01/19/17 0400 01/19/17 2300 01/20/17 1604   Weight: 114 kg (251 lb 5.2 oz) 113 kg (249 lb 1.9 oz) 115.8 kg (255 lb 4.7 oz)       Intake/Output Summary:  (Last 24 hours)    Intake/Output Summary (Last 24 hours) at 01/20/2017 1728  Last data filed at 01/20/2017 1604  Gross per 24 hour   Intake 4031.13 ml   Output 2150 ml   Net 1881.13 ml      Stool Occurrence: 0    Physical Exam  General appearance:  alert, fatigued, well-developed, cooperative and mild distress  Back:  negative  Lungs:  clear to auscultation bilaterally  Heart:  regular rate and rhythm, S1, S2 normal, no murmur, click, rub or gallop  Abdomen:  tenderness in the right side, did not let me palpate. BS present  Extremities:  extremities normal, atraumatic, no cyanosis or edema  Neurologic:  Grossly normal  Peripheral pulses:  2+ and symmetric    Lab Review  Hematology:    Lab Results   Component Value Date    HGB 9.1 01/20/2017    HCT 26.8 01/20/2017    PLTCT 180 01/20/2017    WBC 18.3 01/20/2017    NEUT 83 01/20/2017    ANC 13.30 01/20/2017    ALC 1.70 01/20/2017    MONA 7 01/20/2017    AMC 1.20 01/20/2017 ABC 0.00 01/20/2017    MCV 88.2 01/20/2017    MCHC 33.8 01/20/2017    MPV 7.6 01/20/2017    RDW 16.8 01/20/2017   , Coagulation:    Lab Results   Component Value Date    PTT 30.1 01/20/2017    INR 1.3 01/20/2017   , General Chemistry:    Lab Results   Component Value Date    NA 135 01/20/2017    K 4.6 01/20/2017    CL 96 01/20/2017    GAP 19 01/20/2017    BUN 42 01/20/2017    CR 15.50 01/20/2017    GLU 124 01/20/2017    CA 6.5 01/20/2017    ALBUMIN 2.9 01/20/2017    LACTIC 0.9 01/19/2017    OBSCA 0.82 01/20/2017    MG 1.8 01/20/2017    TOTBILI 0.2 01/20/2017   , Enzymes:    Lab Results   Component Value Date    AST 40 01/20/2017    ALT 12 01/20/2017    ALKPHOS 75 01/20/2017   , Cardiac markers:    Lab Results   Component Value Date    TNI 0.01 01/20/2017   , Mg and PO4:   Lab Results   Component Value Date    MG 1.8 01/20/2017   , Arterial BG : No results found for: PHART, PCO2A, PO2ART, HCO3A, BASEDEFA, O2SATACAL  , Endocrine:   Lab Results   Component Value Date    TSH 1.050 01/16/2017   , HgbA1C:   Lab Results   Component Value Date    HGBA1C 5.1 12/06/2016   , Lipid Profile: No results found for: CHOL, TRIG, HDL, LDL, VLDL, Sed Rate:   Lab Results   Component Value Date    ESR 44 01/13/2017    and CRP:   Lab Results   Component Value Date    CRP 22.92 01/13/2017       CT  abdomen, 01/19/2017  1. Large right pararenal space hemorrhage with active contrast extravasation.  2. The right native kidney is not clearly visualized due to the amount of surrounding complex fluid bleeding/hematoma. Underlying neoplasm cannot be excluded and follow-up imaging after resolution of hemorrhage needed..  3. Peritoneal dialysis catheter in place with a small volume of ascites.  4. Prominent external iliac and inguinal lymph nodes which are likely reactive.

## 2017-01-20 NOTE — H&P (View-Only)
Admission History and Physical Examination      Name:  Alexsandria Billett                                             MRN:  [id number]   Admission Date:  01/12/2017                     Assessment/Plan:    Principal Problem:    CLABSI (central line-associated bloodstream infection)  Active Problems:    ESRD (end stage renal disease) on dialysis (HCC)    Morbid obesity (HCC)    HTN (hypertension)    OSA (obstructive sleep apnea)    Anemia    Acute deep vein thrombosis (DVT) of femoral vein of right lower extremity [facility])    Ms. 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 On 12/12 patient developed increasing abdominal pain and was seen on CT abdomen with right pararenal space hematoma with active bleed and hypotension requiring rapid response, IR intervention, and transfer to MICU for further care.    NEURO:  - no acute concerns    PULM:   Obstructive sleep apnea  B/l course breath sounds   - CXR without acute pulmonary process, no effusion   PLAN  - CPAP ordered      CV:   Hypertension  -pta amlodipine, coreg, fosinopril  PLAN  - Hold pta anti hypertensives in light of recent bleed  ?   GI:   Peptic Ulcer Disease- 2/2 NSAID use  - EGD 12/10: non bleeding duodenal ulcer  - Nausea following hemorrhage likely 2/2 hypotension  PLAN  - PPI IV BID  - Given Qtc 489, will hold off on further IV anti-emetics at this time   ?   RENAL:   Right pararenal space hematoma w/ active bleed   ESRD- currently on peritoneal dialysis  Congenital Hypoplastic Kidneys- s/p failed renal transplant 2008, no current immunosuppression   Hyperkalemia   - Renal consulted for peritoneal dialysis ? 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  - On phoslo   - Corrected calcium 6.6 12/13  PLAN  - calcium gluconate 2g given   - continue pta calcium acetate     ID:   Sepsis- (strep/staph) resolving  Parainfluenzae  Vaginal candidiasis - s/p diflucan  - Leukocytosis (18) likely reactive 2/2 hemorrhage  PLAN  - Continue cefazolin    ?   HEME:   Acute blood loss anemia  Anemia of chronic disease?  Deep Venous Thrombosis    - 12/8 R [place] with artially occlusive acute appearing thrombus within the right external iliac and right common femoral veins  -  Heparin gtt initiated with supratherapeutic PTT on 12/12; hematology consulted and recommended initiation of Eliquis  - CT Abd 12/12: Large right pararenal space hemorrhage with active contrast extravasation.  - Rapid response for hypotension w/ retroperitoneal hemorrhage with IR embolization and transfer to MICU  - Successful coil embolization from bleeding right renal artery branches to mid right renal artery  - Lactic acidosis 2/2 hemorrhage; lactic acid 2.6 12/13  PLAN  - anticoagulation held at this time 2/2 hemorrhage  - will need IR consult for IVC filter given DVT and recent bleed    - hepatology consulted, appreciate recommendations  - Transfusing 2 U pRBCs   - Levophed PRN hypotension   - trending CBC and lactic acid Q6H    Musculoskelatal   - Two Grade 2 pressure ulcers located on buttocks  PLAN  - wound care consulted     Antibiotic Usage:??????cefazolin  VTE: Non in light of active bleed  Stress Ulcer:???PPI IV BID Diet: Renal/Dialysis Diet. NPO at this time till certain no further intervention is required    Pt seen and discussed with Dr. Delila Pereyra, MD  PGY-1  Pager [phone]  ______________________________________________________________________________  Primary Care Physician: Wilford Grist  PCP Unknown    Chief Complaint:  Abdominal Pain   History of Present Illness: Shaunie Boehm is a 21 y.o. 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 DVT was initially treated with heparin with supratherapeutic INR 12/12 AM and switch to Eliquis based on hematology recommendations. Throughout the course of the day the patient developed increasing abdominal and back pain unrelieved by opiates. CT abdomen was done at this time and showed right pararenal space hematoma with active bleed resulting in hypotension and rapid response activation. The patient was stabilized with IVF and taken for embolization in IR before being transferred to the MICU.     Patient is currently hemodynamically stable after embolization. She continued to be sedated following her procedure, but denies HA, dizziness, SOB, CP. States that she continued to have right sided abdominal and back pain, generalized weakness and mild nausea.       Past Medical History:   Diagnosis Date   ??? Depression    ??? H/O parathyroidectomy    ??? Hypertension    ??? Morbid obesity (HCC)    ??? Perirectal abscess    ??? Pica    ??? PUD (peptic ulcer disease)    ??? Pulmonary embolism (HCC)    ??? Renal failure    ??? Sleep apnea      Past Surgical History:   Procedure Laterality Date   ??? KIDNEY TRANSPLANT  2009   ??? CENTRAL VENOUS CATHETER      hemodialysis catheter placement   ??? TUNNELED VENOUS PORT PLACEMENT Family History   Problem Relation Age of Onset   ??? Hypertension Mother    ??? None Reported Brother      Social History     Socioeconomic History   ??? Marital status: Single     Spouse name: Not on file   ??? Number of children: 0   ??? Years of education: 53   ??? Highest education level: Not on file   Social Needs   ??? Financial resource strain:  Not on file   ??? Food insecurity - worry: Not on file   ??? Food insecurity - inability: Not on file   ??? Transportation needs - medical: Not on file   ??? Transportation needs - non-medical: Not on file   Occupational History     Comment: unemployed   Tobacco Use   ??? Smoking status: Never Smoker   ??? Smokeless tobacco: Never Used   Substance and Sexual Activity   ??? Alcohol use: No   ??? Drug use: No   ??? Sexual activity: Yes   Other Topics Concern   ??? Military Service Not Asked   ??? Blood Transfusions Yes   ??? Caffeine Concern Not Asked   ??? Occupational Exposure Not Asked   ??? Hobby Hazards Not Asked   ??? Sleep Concern Not Asked   ??? Stress Concern Not Asked   ??? Weight Concern Not Asked   ??? Special Diet Not Asked   ??? Back Care Not Asked   ??? Exercise No   ??? Bike Helmet Not Asked   ??? Seat Belt Not Asked   ??? Self-Exams Not Asked   Social History Narrative   ??? Not on file      Immunizations (includes history and patient reported): There is no immunization history for the selected administration types on file for this patient.        Allergies:  Advair diskus [fluticasone-salmeterol]; Adhesive tape (rosins); and Coumadin [warfarin]    Medications:  Medications Prior to Admission   Medication Sig   ??? [DISCONTINUED] amLODIPine (NORVASC) 10 mg tablet Take 10 mg by mouth daily.   ??? calcitriol (ROCALTROL) 0.5 mcg capsule Take 1 mcg by mouth daily.   ??? calcium acetate (PHOSLO) 667 mg capsule Take 1,334 mg by mouth three times daily with meals.   ??? [DISCONTINUED] carvedilol (COREG) 25 mg tablet Take 25 mg by mouth twice daily with meals. Take with food. ??? cetirizine (ZYRTEC) 5 mg tablet Take 5 mg by mouth every morning.   ??? Diphenhydramine-Acetaminophen (TYLENOL PM EXTRA STRENGTH) 25-500 mg tab tablet Take 2 tablets by mouth at bedtime as needed.   ??? [DISCONTINUED] fosinopril (MONOPRIL) 40 mg tablet Take 40 mg by mouth daily.   ??? [DISCONTINUED] HYDROcodone/acetaminophen (NORCO) 5/325 mg tablet Take 1 tablet by mouth every 4 hours as needed for Pain   ??? polyethylene glycol 3350 (MIRALAX) 17 g packet Take 17 g by mouth daily as needed.   ??? potassium chloride SR (K-DUR) 10 mEq tablet Take 10 mEq by mouth daily. Take with a meal and a full glass of water.     Review of Systems:  10 point review of systems pertinent for what is mentioned in the HPI.     Physical Exam:  Vital Signs: Last Filed In 24 Hours Vital Signs: 24 Hour Range   BP: 118/73 (12/12 2145)  Temp: 37.1 ???C (98.8 ???F) (12/12 2000)  Pulse: 98 (12/12 2145)  Respirations: 31 PER MINUTE (12/12 2145)  SpO2: 100 % (12/12 2145)  O2 Delivery: Nasal Cannula (12/12 2145) BP: (76-142)/(39-96)   Temp:  [36.3 ???C (97.4 ???F)-37.1 ???C (98.8 ???F)]   Pulse:  [81-102]   Respirations:  [18 PER MINUTE-31 PER MINUTE]   SpO2:  [95 %-100 %]   O2 Delivery: Nasal Cannula   Intensity Pain Scale (Self Report): 8 (01/19/17 1650)      General:  Mildly sedated following procedure, cooperative, opens eyes to voice and follows commands, one word answers, no distress, appears stated  age  Head:  Normocephalic, without obvious abnormality, atraumatic  Eyes:  Conjunctivae clear.  PERRL, EOMs intact bilaterally  Lungs:  Course breath sounds bilaterally. No wheeze or rales  Chest wall:  No tenderness or deformity.  Heart:   Regular rate and rhythm, S1, S2 normal  Abdomen:  Soft, distended. Diffuse tenderness to palpation greatest in the RLQ. Hypoactive bowel sounds.  Extremities: Extremities normal, atraumatic, no cyanosis. Femoral catheter access site c/d/i  Skin: No rashes or lesions Neurologic:   CNII - XII intact bilaterally.  Moves all extremities. Sensation to light touch intact in all extremities.    Lab/Radiology/Other Diagnostic Tests:  24-hour labs:    Results for orders placed or performed during the hospital encounter of 01/12/17 (from the past 24 hour(s))   CULTURE-WOUND/TISSUE/FLUID(AEROBIC ONLY)W/SENSITIVITY    Collection Time: 01/19/17  5:40 PM   Result Value Ref Range    Battery Name ROUTINE CULTURE     Specimen Description PERITONEAL DIALYSATE FLUID     Special Requests NONE     Direct Gram Stain FEW  NEUTROPHILS  FEW  RBC'S  NO ORGANISMS SEEN       Culture      Report Status     CELL COUNT W/DIFF-FLUIDS    Collection Time: 01/19/17  5:40 PM   Result Value Ref Range    White Blood Cells,Fluid 30 /UL    Red Blood Cells,Fluid <1000 /UL    Segmented Neutrophils, Fluid 14 %    Lymphocytes,Fluid 14 %    Monocyte/Histo,Fluid 72 %    Fluid Source PERITONEAL FLUID     Pathology Interpretation,Fluid      Pathologist Signature     GRAM STAIN    Collection Time: 01/19/17  5:40 PM   Result Value Ref Range    Battery Name GRAM STAIN      Specimen Description PERITONEAL DIALYSATE FLUID     Special Requests NONE     Gram Stain FEW  NEUTROPHILS  FEW  RBC'S  NO ORGANISMS SEEN       Report Status FINAL  01/20/2017      CBC    Collection Time: 01/19/17  8:18 PM   Result Value Ref Range    White Blood Cells 12.8 (H) 4.5 - 11.0 K/UL    RBC 2.11 (L) 4.0 - 5.0 M/UL    Hemoglobin 6.1 (L) 12.0 - 15.0 GM/DL    Hematocrit 16.1 (L) 36 - 45 %    MCV 89.9 80 - 100 FL    MCH 28.8 26 - 34 PG    MCHC 32.0 32.0 - 36.0 G/DL    RDW 09.6 (H) 11 - 15 %    Platelet Count 240 150 - 400 K/UL    MPV 7.7 7 - 11 FL   LACTIC ACID(LACTATE)    Collection Time: 01/19/17  8:18 PM   Result Value Ref Range    Lactic Acid 0.9 0.5 - 2.0 MMOL/L   TYPE & CROSSMATCH    Collection Time: 01/19/17  8:23 PM   Result Value Ref Range    Units Ordered 3     Crossmatch Expires 01/22/2017     Record Check FOUND     ABO/RH(D) O POS Antibody Screen       NEG  Patient has a history of a clinically significant antibody.      Unit Number [id number]     Blood Component Type RBC,ADSOL,LEUKO REDUCED     Unit Division 0     Status  OF Unit REL FROM [facility]     Transfusion Status DO NOT ISSUE FOR TRANSFUSION     Crossmatch Result INCOMPATIBLE     Unit Number [id number]     Blood Component Type RBC,ADSOL,LEUKO REDUCED     Unit Division 0     Status OF Unit REL FROM [facility]     Transfusion Status DO NOT ISSUE FOR TRANSFUSION     Crossmatch Result INCOMPATIBLE     Unit Number [id number]     Blood Component Type RBC,ADSOL,LEUKO REDUCED     Unit Division 0     Status OF Unit REL FROM [facility]     Transfusion Status DO NOT ISSUE FOR TRANSFUSION     Crossmatch Result INCOMPATIBLE     Unit Number [id number]     Blood Component Type RBC,ADSOL,LEUKO REDUCED     Unit Division 0     Status OF Unit REL FROM [facility]     Transfusion Status DO NOT ISSUE FOR TRANSFUSION     Crossmatch Result INCOMPATIBLE     Unit Number [id number]     Blood Component Type RBC,ADSOL,LEUKO REDUCED     Unit Division 0     Status OF Unit REL FROM [facility]     Transfusion Status DO NOT ISSUE FOR TRANSFUSION     Crossmatch Result INCOMPATIBLE     Unit Number [id number]     Blood Component Type RBC,ADSOL,LEUKO REDUCED     Unit Division 0     Status OF Unit REL FROM [facility]     Transfusion Status DO NOT ISSUE FOR TRANSFUSION     Crossmatch Result INCOMPATIBLE    PROTIME INR (PT)    Collection Time: 01/19/17  8:30 PM   Result Value Ref Range    INR 1.3 (H) 0.8 - 1.2   PTT (APTT)    Collection Time: 01/19/17  8:30 PM   Result Value Ref Range    APTT 31.0 24.0 - 36.5 SEC   COMPREHENSIVE METABOLIC PANEL    Collection Time: 01/19/17  8:30 PM   Result Value Ref Range    Sodium 134 (L) 137 - 147 MMOL/L    Potassium 5.0 3.5 - 5.1 MMOL/L    Chloride 94 (L) 98 - 110 MMOL/L    Glucose 134 (H) 70 - 100 MG/DL    Blood Urea Nitrogen 37 (H) 7 - 25 MG/DL    Creatinine 25.95 (H) 0.4 - 1.00 MG/DL Calcium 5.6 (LL) 8.5 - 10.6 MG/DL    Total Protein 5.8 (L) 6.0 - 8.0 G/DL    Total Bilirubin 0.2 (L) 0.3 - 1.2 MG/DL    Albumin 2.8 (L) 3.5 - 5.0 G/DL    Alk Phosphatase 72 25 - 110 U/L    AST (SGOT) 39 7 - 40 U/L    CO2 23 21 - 30 MMOL/L    ALT (SGPT) 15 7 - 56 U/L    Anion Gap 17 (H) 3 - 12    eGFR Non African American 3 (L) >60 mL/min    eGFR African American 4 (L) >60 mL/min   MAGNESIUM    Collection Time: 01/19/17  8:30 PM   Result Value Ref Range    Magnesium 1.8 1.6 - 2.6 mg/dL   PHOSPHORUS    Collection Time: 01/19/17  8:30 PM   Result Value Ref Range    Phosphorus 10.1 (H) 2.0 - 4.5 MG/DL   TROPONIN-I    Collection Time: 01/19/17  8:48 PM   Result Value Ref Range  Troponin-I 0.01 0.0 - 0.05 NG/ML   IONIZED CALCIUM    Collection Time: 01/19/17 11:55 PM   Result Value Ref Range    Ionized Calcium 0.70 (L) 1.0 - 1.3 MMOL/L   LACTIC ACID (BG - RAPID LACTATE)    Collection Time: 01/19/17 11:55 PM   Result Value Ref Range    Lactic Acid,BG 2.6 (H) 0.5 - 2.0 MMOL/L   TROPONIN-I    Collection Time: 01/20/17 12:27 AM   Result Value Ref Range    Troponin-I 0.01 0.0 - 0.05 NG/ML   CBC    Collection Time: 01/20/17 12:27 AM   Result Value Ref Range    White Blood Cells 18.3 (H) 4.5 - 11.0 K/UL    RBC 2.11 (L) 4.0 - 5.0 M/UL    Hemoglobin 6.2 (L) 12.0 - 15.0 GM/DL    Hematocrit 16.1 (L) 36 - 45 %    MCV 89.7 80 - 100 FL    MCH 29.2 26 - 34 PG    MCHC 32.5 32.0 - 36.0 G/DL    RDW 09.6 (H) 11 - 15 %    Platelet Count 282 150 - 400 K/UL    MPV 7.7 7 - 11 FL   PROTIME INR (PT)    Collection Time: 01/20/17  3:45 AM   Result Value Ref Range    INR 1.3 (H) 0.8 - 1.2   PTT (APTT)    Collection Time: 01/20/17  3:45 AM   Result Value Ref Range    APTT 30.1 24.0 - 36.5 SEC   COMPREHENSIVE METABOLIC PANEL    Collection Time: 01/20/17  3:45 AM   Result Value Ref Range    Sodium 131 (L) 137 - 147 MMOL/L    Potassium 6.6 (HH) 3.5 - 5.1 MMOL/L    Chloride 95 (L) 98 - 110 MMOL/L    Glucose 116 (H) 70 - 100 MG/DL Blood Urea Nitrogen 39 (H) 7 - 25 MG/DL    Creatinine 04.54 (H) 0.4 - 1.00 MG/DL    Calcium 6.0 (L) 8.5 - 10.6 MG/DL    Total Protein 6.5 6.0 - 8.0 G/DL    Total Bilirubin 0.2 (L) 0.3 - 1.2 MG/DL    Albumin 2.9 (L) 3.5 - 5.0 G/DL    Alk Phosphatase 75 25 - 110 U/L    AST (SGOT) 40 7 - 40 U/L    CO2 18 (L) 21 - 30 MMOL/L    ALT (SGPT) 12 7 - 56 U/L    Anion Gap 18 (H) 3 - 12    eGFR Non African American 3 (L) >60 mL/min    eGFR African American 4 (L) >60 mL/min   MAGNESIUM    Collection Time: 01/20/17  3:45 AM   Result Value Ref Range    Magnesium 1.8 1.6 - 2.6 mg/dL   POC GLUCOSE    Collection Time: 01/20/17  5:37 AM   Result Value Ref Range    Glucose, POC 108 (H) 70 - 100 MG/DL   POC GLUCOSE    Collection Time: 01/20/17  6:43 AM   Result Value Ref Range    Glucose, POC 129 (H) 70 - 100 MG/DL     Glucose: (!) 098 (11/91/47 2030)  Pertinent radiology reviewed.    Fernanda Drum, MD  Pager [phone]

## 2017-01-20 NOTE — H&P (View-Only)
IR History and Physical Summary Note      Name:Debra Lam                                                                   MRN: [id number]                 DOB:May 02, 1995          Age: 21 y.o.  Admission Date: 01/12/2017             Days Admitted: LOS: 7 days      Procedure Date: 01/19/2017     Planned Procedures:  Arteriogram with possible intervention  Indications:  Right pararenal space hematoma with active bleeding    Allergies:  Advair diskus [fluticasone-salmeterol]; Adhesive tape (rosins); and Coumadin [warfarin]    Lab/Other Diagnostic Tests:     24-hour labs:    Results for orders placed or performed during the hospital encounter of 01/12/17 (from the past 24 hour(s))   CBC AND DIFF    Collection Time: 01/19/17  4:20 AM   Result Value Ref Range    White Blood Cells 6.8 4.5 - 11.0 K/UL    RBC 2.69 (L) 4.0 - 5.0 M/UL    Hemoglobin 7.8 (L) 12.0 - 15.0 GM/DL    Hematocrit 78.4 (L) 36 - 45 %    MCV 89.3 80 - 100 FL    MCH 29.1 26 - 34 PG    MCHC 32.6 32.0 - 36.0 G/DL    RDW 69.6 (H) 11 - 15 %    Platelet Count 209 150 - 400 K/UL    MPV 7.7 7 - 11 FL    Neutrophils 66 41 - 77 %    Lymphocytes 24 24 - 44 %    Monocytes 7 4 - 12 %    Eosinophils 3 0 - 5 %    Basophils 0 0 - 2 %    Absolute Neutrophil Count 4.50 1.8 - 7.0 K/UL    Absolute Lymph Count 1.70 1.0 - 4.8 K/UL    Absolute Monocyte Count 0.50 0 - 0.80 K/UL    Absolute Eosinophil Count 0.20 0 - 0.45 K/UL    Absolute Basophil Count 0.00 0 - 0.20 K/UL   COMPREHENSIVE METABOLIC PANEL    Collection Time: 01/19/17  4:20 AM   Result Value Ref Range    Sodium 135 (L) 137 - 147 MMOL/L    Potassium 3.5 3.5 - 5.1 MMOL/L    Chloride 94 (L) 98 - 110 MMOL/L    Glucose 97 70 - 100 MG/DL    Blood Urea Nitrogen 34 (H) 7 - 25 MG/DL    Creatinine 29.52 (H) 0.4 - 1.00 MG/DL    Calcium 6.1 (L) 8.5 - 10.6 MG/DL    Total Protein 6.9 6.0 - 8.0 G/DL    Total Bilirubin 0.2 (L) 0.3 - 1.2 MG/DL    Albumin 3.1 (L) 3.5 - 5.0 G/DL Alk Phosphatase 81 25 - 110 U/L    AST (SGOT) 42 (H) 7 - 40 U/L    CO2 25 21 - 30 MMOL/L    ALT (SGPT) 20 7 - 56 U/L    Anion Gap 16 (H) 3 - 12    eGFR Non African American 3 (  L) >60 mL/min    eGFR African American 4 (L) >60 mL/min   PTT (APTT)    Collection Time: 01/19/17  4:20 AM   Result Value Ref Range    APTT 148.7 (HH) 24.0 - 36.5 SEC   CREATINE KINASE-CPK    Collection Time: 01/19/17  4:20 AM   Result Value Ref Range    Creatine Kinase 127 21 - 215 U/L   PTT (APTT)    Collection Time: 01/19/17  6:50 AM   Result Value Ref Range    APTT 74.7 (H) 24.0 - 36.5 SEC   CBC    Collection Time: 01/19/17  8:18 PM   Result Value Ref Range    White Blood Cells 12.8 (H) 4.5 - 11.0 K/UL    RBC 2.11 (L) 4.0 - 5.0 M/UL    Hemoglobin 6.1 (L) 12.0 - 15.0 GM/DL    Hematocrit 16.1 (L) 36 - 45 %    MCV 89.9 80 - 100 FL    MCH 28.8 26 - 34 PG    MCHC 32.0 32.0 - 36.0 G/DL    RDW 09.6 (H) 11 - 15 %    Platelet Count 240 150 - 400 K/UL    MPV 7.7 7 - 11 FL   TYPE & CROSSMATCH    Collection Time: 01/19/17  8:23 PM   Result Value Ref Range    Units Ordered 3     Crossmatch Expires 01/22/2017     Record Check FOUND     ABO/RH(D)      Antibody Screen     PROTIME INR (PT)    Collection Time: 01/19/17  8:30 PM   Result Value Ref Range    INR 1.3 (H) 0.8 - 1.2   PTT (APTT)    Collection Time: 01/19/17  8:30 PM   Result Value Ref Range    APTT 31.0 24.0 - 36.5 SEC         History and Physical Update: I have examined the patient, and there are changes in their condition, from the previous H&P performed on 01/18/2017. Pt with abd pain this afternoon so CT A/P was ordered.  She later became hypotensive this evening in the 80s/40s and rapid response was called.  CT A/P results showed large right pararenal space hematoma with active bleeding.     Previous Anesthetic/Sedation History: no adverse events    Airway:  airway assessment performed  Mallampati III (soft palate, base of uvula visible) Anesthesia Classification:  ASA IV (A patient with an incapacitating systemic disease that is a constant threat to life)  NPO Status: Acceptable  Pregnancy Status: N/A  Sedation/Medication Plan:  Conscious sedation  Discussion/Reviews: Physician has discussed risks and alternatives of this type of sedation and above planned procedures with patient       H. Mayford Knife, MD  Pager [phone]

## 2017-01-20 NOTE — Progress Notes
Pt's mom updated on plan of care by Lyman BishopKara Field, APRN. Her phone number is [phone].

## 2017-01-21 LAB — BASIC METABOLIC PANEL
Lab: 133 MMOL/L — ABNORMAL LOW (ref 137–147)
Lab: 118 mg/dL — ABNORMAL HIGH (ref 70–100)
Lab: 12 pg (ref 3–12)
Lab: 135 MMOL/L — ABNORMAL LOW (ref 137–147)
Lab: 27 MMOL/L (ref 21–30)
Lab: 4.4 MMOL/L — ABNORMAL LOW (ref 3.5–5.1)
Lab: 96 MMOL/L — ABNORMAL LOW (ref 98–110)

## 2017-01-21 LAB — CBC
Lab: 14 K/UL — ABNORMAL HIGH (ref 4.5–11.0)
Lab: 2.5 M/UL — ABNORMAL LOW (ref 4.0–5.0)
Lab: 23 % — ABNORMAL LOW (ref 36–45)
Lab: 7.8 g/dL — ABNORMAL LOW (ref 12.0–15.0)
Lab: 14 10*3/uL — ABNORMAL HIGH (ref 4.5–11.0)
Lab: 13 10*3/uL — ABNORMAL HIGH (ref 4.5–11.0)
Lab: 17 % — ABNORMAL HIGH (ref 11–15)
Lab: 2.2 M/UL — ABNORMAL LOW (ref 4.0–5.0)
Lab: 201 K/UL (ref 150–400)
Lab: 30 pg — ABNORMAL LOW (ref 26–34)
Lab: 33 g/dL (ref 32.0–36.0)
Lab: 6.8 FL — ABNORMAL LOW (ref 7–11)

## 2017-01-21 LAB — IONIZED CALCIUM
Lab: 0.8 MMOL/L — ABNORMAL LOW (ref 1.0–1.3)
Lab: 0.8 MMOL/L — ABNORMAL LOW (ref >60)
Lab: 0.6 MMOL/L — ABNORMAL LOW (ref 1.0–1.3)

## 2017-01-21 LAB — PROTIME INR (PT): Lab: 1.9 M/UL — ABNORMAL HIGH (ref >60)

## 2017-01-21 LAB — LACTIC ACID (BG - RAPID LACTATE)
Lab: 1.4 MMOL/L (ref 0.5–2.0)
Lab: 1.4 MMOL/L (ref 0.5–2.0)
Lab: 1.3 MMOL/L (ref 0.5–2.0)
Lab: 0.8 MMOL/L (ref 0.5–2.0)

## 2017-01-21 LAB — CBC AND DIFF: Lab: 14 K/UL — ABNORMAL HIGH (ref 4.5–11.0)

## 2017-01-21 LAB — POC GLUCOSE: Lab: 112 mg/dL — ABNORMAL HIGH (ref 70–100)

## 2017-01-21 LAB — PTT (APTT): Lab: 34 s — ABNORMAL LOW (ref >60)

## 2017-01-21 LAB — COMPREHENSIVE METABOLIC PANEL: Lab: 137 MMOL/L — ABNORMAL LOW (ref 137–147)

## 2017-01-21 LAB — MAGNESIUM: Lab: 1.8 mg/dL — ABNORMAL LOW (ref >60)

## 2017-01-21 MED ORDER — B COMP NO3-FOLIC-C-BIOTIN-ZINC 1-60-300-12.5 MG-MG-MCG-MG PO TAB
1 | Freq: Every day | ORAL | 0 refills | Status: DC
Start: 2017-01-21 — End: 2017-01-29
  Administered 2017-01-22 – 2017-01-28 (×7): 1 via ORAL

## 2017-01-21 MED ORDER — SODIUM CHLORIDE 0.9 % IV SOLP
250 mL | INTRAVENOUS | 0 refills | Status: AC | PRN
Start: 2017-01-21 — End: ?

## 2017-01-21 MED ORDER — MAGNESIUM SULFATE IN D5W 1 GRAM/100 ML IV PGBK
1 g | Freq: Once | INTRAVENOUS | 0 refills | Status: CP
Start: 2017-01-21 — End: ?
  Administered 2017-01-21: 15:00:00 1 g via INTRAVENOUS

## 2017-01-21 MED ORDER — LORAZEPAM 2 MG/ML IJ SOLN
.25 mg | Freq: Once | INTRAVENOUS | 0 refills | Status: CP
Start: 2017-01-21 — End: ?
  Administered 2017-01-21: 18:00:00 0.25 mg via INTRAVENOUS

## 2017-01-21 MED ORDER — FENTANYL CITRATE (PF) 50 MCG/ML IJ SOLN
25 ug | INTRAVENOUS | 0 refills | Status: DC | PRN
Start: 2017-01-21 — End: 2017-01-27
  Administered 2017-01-21 – 2017-01-27 (×13): 25 ug via INTRAVENOUS

## 2017-01-21 MED ORDER — SODIUM CHLORIDE 0.9 % IV SOLP
1000 mL | INTRAVENOUS | 0 refills | Status: AC | PRN
Start: 2017-01-21 — End: ?

## 2017-01-21 MED ORDER — ANTICOAG SODIUM CITRATE SOLN 5ML
1-5 mL | Freq: Once | 0 refills | Status: CP
Start: 2017-01-21 — End: ?
  Administered 2017-01-22: 01:00:00 4.2 mL

## 2017-01-21 MED ORDER — CALCIUM CHLORIDE IVPB
1 g | Freq: Once | INTRAVENOUS | 0 refills | Status: CP
Start: 2017-01-21 — End: ?
  Administered 2017-01-21 (×2): 1 g via INTRAVENOUS

## 2017-01-21 MED ORDER — HYDROCODONE-ACETAMINOPHEN 5-325 MG PO TAB
1-2 | ORAL | 0 refills | Status: DC | PRN
Start: 2017-01-21 — End: 2017-01-29
  Administered 2017-01-21 – 2017-01-22 (×7): 1 via ORAL
  Administered 2017-01-23 – 2017-01-28 (×23): 2 via ORAL

## 2017-01-21 MED ORDER — SODIUM CHLORIDE 0.9 % IV SOLP
300 mL | INTRAVENOUS | 0 refills | Status: CP | PRN
Start: 2017-01-21 — End: ?
  Administered 2017-01-21: 21:00:00 300 mL via INTRAVENOUS

## 2017-01-21 NOTE — Case Management (ED)
Case Management Progress Note    NAME:Debra Lam                          MRN: [id number]              DOB:October 17, 1995          AGE: 21 y.o.  ADMISSION DATE: 01/12/2017             DAYS ADMITTED: LOS: 9 days      Today???s Date: 01/21/2017    Plan  Dialysis needs    Interventions  ? Support      ? Info or Referral    Spoke to Erskine Squibb, Charity fundraiser at [facility] clinic concerning patient's status/update.  [phone]  Patient was to start HD in center dialysis on 12/1 but didn't show.  She did have a HD session on 12/4 at [facility].   She has a TRS schedule.   The patient was planning to return to [facility] but the [facility] dialysis clinic where patient was previously declined to accept patient for the home PD program due to noncompliance.     [facility] would take patient if she did  HD in center.  Will need to clarify closer to discharge patient's plans for returning to [facility] and dialysis center placement confirmed.     ? Discharge Planning   Discharge Planning: OP HD or PD  ? Medication Needs      ? Financial      ? Legal      ? Other      Disposition  ? Expected Discharge Date    Expected Discharge Date: 01/24/17  ? Transportation   Does the patient need discharge transport arranged?: No  Transportation Name, Phone and Availability #1: Johny Drilling (brother) - [phone] or Asher Muir (sister-in-law) - [phone]  ? Next Level of Care (Acute Psych discharges only)      ? Discharge Disposition                           Durable Medical Equipment      No service has been selected for the patient.      [place] Destination      No service has been selected for the patient.      [facility] - Selection Complete      Service Provider Request Status Selected Services Address Phone Number Fax Number    [facility] CARE Selected Home Health Services [address] [place] [address], [place] [address] [phone]      [facility] - Selection Complete Service Provider Request Status Selected Services Address Phone Number Fax Number    [facility] INFUSION Selected Home Infusion and Injection [address], [place] [address] [phone]          Rae Mar, RN MSN ONC  Nurse Case Manager  Pg [phone]  X- [phone]

## 2017-01-21 NOTE — Procedures
HEMODIALYSIS- 01/20/17    PT ID CHECKED- YES    1600- Machine checks verified and consent on chart. Pre- treatment VS per patient trends.    1604- Started 4 hour HD session.    1805- Drainage problem noted in patient's room, maintained at this time. Dr. Lester Carolinaorosyan agreed to 3 hours instead of 4 due to issues with drain.    1840- Dr. Cathie HoopsYu paged after unable to quickly reach Dr. Lester Carolinaorosyan, drain problem worse, notified MD of issue. MD ordered to stop treatment now.    1845- Blood returned. Hemodialysis completed.    1915- Drain problem found per maintenance team, patient to move to new room so drain can be fixed. Physicians on nephrology team to follow up with patient labs to determine further need for dialysis. ICU team planning on running post treatment labs about 2 hour after session to check potassium.

## 2017-01-21 NOTE — Progress Notes
CLINICAL NUTRITION                                                        Clinical Nutrition Assessment Summary     NAME:Debra Lam             MRN: [id number]             DOB:12/28/1995          AGE: 21 y.o.  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 On 12/12 patient developed increasing abdominal pain and was seen on CT abdomen with right pararenal space hematoma with active bleed and hypotension requiring rapid response, IR intervention, and transfer to MICU for further care. She underwent HD today. Noted very poor PO intake past 3 days. Pt refusing PO intake today. She was sleeping soundly/lethargic at visit. Appears to have good PO intake earlier in admit. Seen by outpatient RD in October and noted good apetite, noncompliance with renal diet. Pt with 2 stage ll PUs currently.     Recommendation:  REC cont renal diet; please offer Novasource Renal and Boost Breeze supplements                                Intervention / Plan:  Assessed adequacy/tolerance of intakes   Ordered renal MV    Nutrition Diagnosis:  Inadequate protein-energy intake  Etiology: medical status, altered GI function   Signs & Symptoms: pt, RN report, EMR     Goals:  Patient to consume >50% of meals  Time Frame: Within [address] Hours        Beth Troutt, RD, LD, CNSC *1111  office  [phone]

## 2017-01-22 ENCOUNTER — Encounter: Admit: 2017-01-22 | Discharge: 2017-01-22 | Payer: MEDICARE

## 2017-01-22 ENCOUNTER — Inpatient Hospital Stay: Admit: 2017-01-14 | Discharge: 2017-01-14 | Payer: MEDICARE

## 2017-01-22 DIAGNOSIS — T80211A Bloodstream infection due to central venous catheter, initial encounter: Principal | ICD-10-CM

## 2017-01-22 LAB — CBC AND DIFF
Lab: 0 % (ref 0–2)
Lab: 0 10*3/uL (ref 0–0.20)
Lab: 0.1 10*3/uL (ref 0–0.45)
Lab: 1 % (ref 0–5)
Lab: 1 10*3/uL (ref 1.0–4.8)
Lab: 1.6 10*3/uL — ABNORMAL HIGH (ref 0–0.80)
Lab: 11 % (ref 4–12)
Lab: 11 10*3/uL — ABNORMAL HIGH (ref 1.8–7.0)
Lab: 14 10*3/uL — ABNORMAL HIGH (ref 4.5–11.0)
Lab: 16 % — ABNORMAL HIGH (ref 11–15)
Lab: 202 10*3/uL (ref 150–400)
Lab: 24 % — ABNORMAL LOW (ref 36–45)
Lab: 30 pg (ref 26–34)
Lab: 34 g/dL (ref 32.0–36.0)
Lab: 6.4 FL — ABNORMAL LOW (ref 7–11)
Lab: 8 % — ABNORMAL LOW (ref 24–44)
Lab: 80 % — ABNORMAL HIGH (ref 41–77)
Lab: 90 FL (ref 80–100)
Lab: 13 K/UL — ABNORMAL HIGH (ref 4.5–11.0)
Lab: 14 10*3/uL — ABNORMAL HIGH (ref 4.5–11.0)
Lab: 0 % (ref 0–2)
Lab: 0 % (ref 0–5)
Lab: 0 10*3/uL (ref 0–0.20)
Lab: 0 10*3/uL (ref 0–0.45)
Lab: 1.1 10*3/uL — ABNORMAL HIGH (ref 0–0.80)
Lab: 1.2 10*3/uL (ref 1.0–4.8)
Lab: 10 % — ABNORMAL LOW (ref 24–44)
Lab: 10 10*3/uL — ABNORMAL HIGH (ref 1.8–7.0)
Lab: 12 10*3/uL — ABNORMAL HIGH (ref 4.5–11.0)
Lab: 16 % — ABNORMAL HIGH (ref 11–15)
Lab: 2.5 M/UL — ABNORMAL LOW (ref 4.0–5.0)
Lab: 218 10*3/uL (ref 150–400)
Lab: 22 % — ABNORMAL LOW (ref 36–45)
Lab: 30 pg (ref 26–34)
Lab: 33 g/dL (ref 32.0–36.0)
Lab: 6.1 FL — ABNORMAL LOW (ref 7–11)
Lab: 7.7 g/dL — ABNORMAL LOW (ref 12.0–15.0)
Lab: 81 % — ABNORMAL HIGH (ref 41–77)
Lab: 9 % (ref 4–12)
Lab: 91 FL (ref 80–100)

## 2017-01-22 LAB — PHOSPHORUS: Lab: 3.2 mg/dL — ABNORMAL LOW (ref 2.0–4.5)

## 2017-01-22 LAB — GRAM STAIN

## 2017-01-22 LAB — LACTIC ACID(LACTATE): Lab: 0.8 MMOL/L — ABNORMAL LOW (ref >60)

## 2017-01-22 LAB — IONIZED CALCIUM: Lab: 0.7 MMOL/L — ABNORMAL LOW (ref 1.0–1.3)

## 2017-01-22 LAB — COMPREHENSIVE METABOLIC PANEL: Lab: 136 MMOL/L — ABNORMAL LOW (ref 137–147)

## 2017-01-22 LAB — PTT (APTT): Lab: 34 s — ABNORMAL LOW (ref 24.0–36.5)

## 2017-01-22 LAB — PROTIME INR (PT): Lab: 2.7 % — ABNORMAL HIGH (ref >60)

## 2017-01-22 LAB — MAGNESIUM: Lab: 1.9 mg/dL — ABNORMAL HIGH (ref 1.6–2.6)

## 2017-01-22 LAB — LACTIC ACID (BG - RAPID LACTATE): Lab: 0.7 MMOL/L (ref 0.5–2.0)

## 2017-01-22 MED ORDER — LIDOCAINE 5 % TP PTMD
1 | Freq: Every day | TOPICAL | 0 refills | Status: DC
Start: 2017-01-22 — End: 2017-01-29
  Administered 2017-01-23 – 2017-01-24 (×3): 1 via TOPICAL

## 2017-01-22 MED ORDER — MAGNESIUM SULFATE IN D5W 1 GRAM/100 ML IV PGBK
1 g | Freq: Once | INTRAVENOUS | 0 refills | Status: CP
Start: 2017-01-22 — End: ?
  Administered 2017-01-22: 15:00:00 1 g via INTRAVENOUS

## 2017-01-22 NOTE — Progress Notes
Renal Consult    Debra Lam  Admission Date:  01/12/2017         Assessment and Plan     Principal Problem:    CLABSI (central line-associated bloodstream infection)  Active Problems:    ESRD (end stage renal disease) on dialysis (HCC)    Morbid obesity (HCC)    HTN (hypertension)    OSA (obstructive sleep apnea)    Anemia    Acute deep vein thrombosis (DVT) of femoral vein of right lower extremity (HCC)        Debra Lam is a 21 y.o. 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 aureus growing from [facility] tip    Retroperitoneal bleed  - CT abd 12/12 with retroperitoneal bleed --> STAT IR: Right renal artery: Widely patent right renal artery. 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   - 2016 parathyroidectomy   - On phoslo    SOB - positive for parainfluenza     H/o perirectal abscess 2016  H/o perineal abscess- req I&D/ IV abx 2017  HTN  OSA  Hx of PE  - 2016, likely provoked while on OCP  - Was on eliquis, not currently on anticoagulation    Iron deficiency anemia  Tsat 10%, ferritin 610 likely due to acute infection    Recommendations:  - On treatment for line sepsis   - ID following  - PD fluid cell count unremarkable   - HD today; plan for next HD on Monday  - given unclear etiology for spontaneous renal artery branches (on anticoagulation) --> would re-image with CT abd/pelvis in future once hemorrhage has resorbed to exclude underlying abnormalities, such as renal cell carcinoma     pt discussed with Dr. Lou Miner, MD  Nephrology fellow  Pager [phone]      History     HPI: Debra Lam is a 21 y.o. female     Continues to have abd pain today; minimal left thigh pain    She denies N/V/SOB.       Past Medical History   Past Medical History:   Diagnosis Date   ??? Depression    ??? H/O parathyroidectomy    ??? Hypertension    ??? Morbid obesity (  HCC)    ??? Perirectal abscess    ??? Pica    ??? PUD (peptic ulcer disease)    ??? Pulmonary embolism (HCC)    ??? Renal failure    ??? Sleep apnea        Past Surgical History:   Procedure Laterality Date   ??? KIDNEY TRANSPLANT  2009   ??? CENTRAL VENOUS CATHETER      hemodialysis catheter placement   ??? TUNNELED VENOUS PORT PLACEMENT         Family History  Family History   Problem Relation Age of Onset   ??? Hypertension Mother    ??? None Reported Brother          Social History   Social History     Socioeconomic History   ??? Marital status: Single     Spouse name: Not on file   ??? Number of children: 0   ??? Years of education: 74   ??? Highest education level: Not on file   Social Needs   ??? Financial resource strain: Not on file   ??? Food insecurity - worry: Not on file   ??? Food insecurity - inability: Not on file   ??? Transportation needs - medical: Not on file ??? Transportation needs - non-medical: Not on file   Occupational History     Comment: unemployed   Tobacco Use   ??? Smoking status: Never Smoker   ??? Smokeless tobacco: Never Used   Substance and Sexual Activity   ??? Alcohol use: No   ??? Drug use: No   ??? Sexual activity: Yes   Other Topics Concern   ??? Military Service Not Asked   ??? Blood Transfusions Yes   ??? Caffeine Concern Not Asked   ??? Occupational Exposure Not Asked   ??? Hobby Hazards Not Asked   ??? Sleep Concern Not Asked   ??? Stress Concern Not Asked   ??? Weight Concern Not Asked   ??? Special Diet Not Asked   ??? Back Care Not Asked   ??? Exercise No   ??? Bike Helmet Not Asked   ??? Seat Belt Not Asked   ??? Self-Exams Not Asked   Social History Narrative   ??? Not on file         Medications  MEDS    anticoagulant sodium citrate 1-5 mL Intra-catheter Once in IP Dialysis   anticoagulant sodium citrate 1-5 mL Intra-catheter ONCE   calcium acetate 1,334 mg Oral TID w/ meals   ceFAZolin  (ANCEF)  IV 1.25 g Intravenous Q12H*   cetirizine 5 mg Oral QAM8   cyclobenzaprine 5 mg Oral TID   lidocaine 1 patch Topical QDAY   pantoprazole DR 40 mg Oral BID(11-21)   senna/docusate 10 mL Oral BID   sevelamer carbonate 800 mg Oral TID w/ meals   vitamins, multi B, C, Zn & folate (Renal) 1 tablet Oral QDAY    IV MEDS  ??? norepinephrine (LEVOPHED) 4 mg in dextrose 5% (D5W) 250 mL IV drip (std conc) Stopped (01/20/17 0535)   ??? sodium chloride 0.9 %   infusion 1,000 mL (01/19/17 2020)     Prn emu PRN, fentaNYL citrate PF Q6H PRN 25 mcg at 01/21/17 1617, HYDROcodone/acetaminophen Q4H PRN 1 tablet at 01/21/17 1725, ondansetron (ZOFRAN) IV Q6H PRN 4 mg at 01/20/17 0139, sodium chloride 0.9% (NS) IP Dialysis PRN, sodium chloride 0.9% (NS) IP Dialysis PRN, tiZANidine Q8H PRN 4 mg at 01/19/17 0527     HOME  MEDS  Prior to Admission Medications   Prescriptions Last Dose Informant Patient Reported? Taking?   amLODIPine (NORVASC) 10 mg tablet   Yes No   Sig: Take 10 mg by mouth daily. calcitriol (ROCALTROL) 0.5 mcg capsule   Yes No   Sig: Take 1 mcg by mouth daily.   calcium acetate (PHOSLO) 667 mg capsule   Yes No   Sig: Take 1,334 mg by mouth three times daily with meals.   carvedilol (COREG) 25 mg tablet   Yes No   Sig: Take 25 mg by mouth twice daily with meals. Take with food.   cetirizine (ZYRTEC) 5 mg tablet   Yes No   Sig: Take 5 mg by mouth every morning.   fosinopril (MONOPRIL) 40 mg tablet   Yes No   Sig: Take 40 mg by mouth daily.   polyethylene glycol 3350 (MIRALAX) 17 g packet   Yes No   Sig: Take 17 g by mouth daily as needed.   potassium chloride SR (K-DUR) 10 mEq tablet   Yes No   Sig: Take 10 mEq by mouth daily. Take with a meal and a full glass of water.      Facility-Administered Medications: None              Physical Exam        Vital Signs: Last Filed In 24 Hours Vital Signs: 24 Hour Range   BP: 144/74 (12/14 1800)  Temp: 37.6 ???C (99.7 ???F) (12/14 1800)  Pulse: 118 (12/14 1800)  Respirations: 26 PER MINUTE (12/14 1800)  SpO2: 99 % (12/14 1800)  O2 Delivery: None (Room Air) (12/14 1800)  SpO2 Pulse: 118 (12/14 1800) BP: (99-169)/(61-116)   Temp:  [36.5 ???C (97.7 ???F)-38.2 ???C (100.8 ???F)]   Pulse:  [111-147]   Respirations:  [15 PER MINUTE-41 PER MINUTE]   SpO2:  [60 %-100 %]   O2 Delivery: None (Room Air)   Intensity Pain Scale (Self Report): 0 (01/21/17 1545)      Vitals:    01/19/17 2300 01/20/17 1604 01/21/17 1500   Weight: 113 kg (249 lb 1.9 oz) 115.8 kg (255 lb 4.7 oz) 118.4 kg (261 lb 0.4 oz)       Intake/Output Summary (Last 24 hours) at 01/21/2017 1814  Last data filed at 01/21/2017 1800  Gross per 24 hour   Intake 1342 ml   Output 800 ml   Net 542 ml        General:  Alert, cooperative, obese female  Head:  Normocephalic, without obvious abnormality, atraumatic  Eyes:  Conjunctivae/corneas clear.  PERRL, EOMs intact.  Neck:  Supple  Chest: port  Lungs:  CTAB anteriorly   Heart:    RRR; S1, S2 normal; No M/R/G  Abdomen:  diffusely tender to palpation; soft Extremities: edema; LLE thigh tunneled HD  Skin:   Dry skin; No rashes or lesions  Neurologic:  CNII - XII grossly intact.       Labs     Recent Labs      01/19/17   0420  01/19/17   2030  01/20/17   0345  01/20/17   0830  01/20/17   1124  01/20/17   1418  01/20/17   2235  01/21/17   0130  01/21/17   0352   NA  135*  134*  131*  137  135*  135*  133*  135*  137   K  3.5  5.0  6.6*  3.8  4.2  4.6  4.5  4.4  4.2   CL  94*  94*  95*  102  96*  96*  96*  96*  96*   CO2  25  23  18*  20*  21  20*  28  27  26    GAP  16*  17*  18*  15*  18*  19*  9  12  15*   BUN  34*  37*  39*  37*  42*  42*  23  23  23    CR  13.58*  15.04*  14.37*  13.16*  14.56*  15.50*  9.54*  10.15*  9.73*   GLU  97  134*  116*  97  113*  124*  115*  118*  123*   CA  6.1*  5.6*  6.0*  5.2*  6.8*  6.5*  6.8*  6.6*  6.4*   ALBUMIN  3.1*  2.8*  2.9*   --    --    --    --    --   3.0*   MG   --   1.8  1.8   --    --    --    --    --   1.8   PO4   --   10.1*   --    --    --    --    --    --    --        Recent Labs      01/19/17   0420  01/19/17   0650  01/19/17   2018  01/19/17   2030  01/19/17   2048  01/20/17   0027  01/20/17   0345  01/20/17   0830  01/20/17   1418  01/20/17   2235  01/21/17   0130  01/21/17   0352  01/21/17   1022   WBC  6.8   --   12.8*   --    --   18.3*   --   16.2*  18.3*  14.4*  14.3*  14.0*  13.1*   HGB  7.8*   --   6.1*   --    --   6.2*   --   6.6*  9.1*  7.8*  7.7*  7.7*  6.7*   HCT  24.0*   --   18.9*   --    --   19.0*   --   19.9*  26.8*  23.0*  22.8*  22.7*  19.8*   PLTCT  209   --   240   --    --   282   --   207  180  249  226  235  201   INR   --    --    --   1.3*   --    --   1.3*   --    --    --    --   1.9*   --    PTT  148.7*  74.7*   --   31.0   --    --   30.1   --    --    --    --   34.0   --    AST  42*   --    --   39   --    --   40   --    --    --    --  25   --    ALT  20   --    --   15   --    --   12   --    --    --    --   5*   -- ALKPHOS  81   --    --   72   --    --   75   --    --    --    --   75   --    TNI   --    --    --    --   0.01  0.01   --    --    --    --    --    --    --       Estimated Creatinine Clearance: 11.2 mL/min (A) (based on SCr of 9.73 mg/dL (H)).  Vitals:    01/19/17 2300 01/20/17 1604 01/21/17 1500   Weight: 113 kg (249 lb 1.9 oz) 115.8 kg (255 lb 4.7 oz) 118.4 kg (261 lb 0.4 oz)    No results for input(s): PHART, PO2ART in the last 72 hours.    Invalid input(s): PC02A                        Radiology     Pertinent radiology reviewed.

## 2017-01-22 NOTE — Procedures
1915- Completed 4 hour hemodialysis treatment, unable to pull much fluid. Notified Dr. Cathie HoopsYu, MD aware and alright with not reaching fluid goal. Post treatment VS stable.    1945- Report given to ICU RN prior to leaving area.

## 2017-01-22 NOTE — Progress Notes
0715: Report received from night RN. Bedside safety check complete. Lines and alarms verified.,     0800: pt assessed per ICU flowsheets, patient remains lethargic. VSS per pt trends. Patient reports 10/10 abdominal pain. PRN norco given as ordered. Will continue to monitor and assess.

## 2017-01-22 NOTE — Progress Notes
1930 - Care assumed. VSS per pt trends. Pt sleeping. Mother at bedside.   0545 - Pt desat to 46% on RA. Pt very lethargic. 5L NC placed back on pt, refusing bipap. Education provided on importance of wearing either NC or bipap while sleeping.   0600 - Pt has been complaining of abdominal and back pain during shift. Pt frequently calling out for pain medications, but would be asleep before this RN could answer the call light. Unable to stay awake for >10 seconds, but will awaken to voice. Pt has been asking for IV pain medications, this RN educated patient that PO pain medications needed to be tried first and that IV was reserved for breakthrough pain.

## 2017-01-22 NOTE — Progress Notes
Renal Consult    Debra Lam  Admission Date:  01/12/2017         Assessment and Plan     Principal Problem:    CLABSI (central line-associated bloodstream infection)  Active Problems:    ESRD (end stage renal disease) on dialysis (HCC)    Morbid obesity (HCC)    HTN (hypertension)    OSA (obstructive sleep apnea)    Anemia    Acute deep vein thrombosis (DVT) of femoral vein of right lower extremity (HCC)        Debra Lam is a 21 y.o. 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 aureus growing from [facility] tip    Retroperitoneal bleed  - CT abd 12/12 with retroperitoneal bleed --> STAT IR: Right renal artery: Widely patent right renal artery. 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 She pulled out 20 mL of what looked like frank blood. Cell count showed 1.6 million RBC per ul, which is about 2/3 that of peripheral blood. VS stable but Hgb has drifted down since last night.       Recommendations:  - On treatment for line sepsis   - ID following  - PD fluid cell count unremarkable   - Plan for next HD on Monday  - Relayed findings to MICU team - recommend abdominal CT and serial CBCs  - given unclear etiology for spontaneous renal artery branches (on anticoagulation) --> would re-image with CT abd/pelvis in future once hemorrhage has resorbed to exclude underlying abnormalities, such as renal cell carcinoma     Francena Hanly  2660      History     HPI: Debra Lam is a 21 y.o. female     Feels well but continued  abdominal pain and asking for augmented analgesic regimen. She also points out that there is blood tinged fluid in her PD catheter.    She denies N/V/SOB.       Past Medical History   Past Medical History:   Diagnosis Date   ??? Depression    ??? H/O parathyroidectomy    ??? Hypertension    ??? Morbid obesity (HCC)    ??? Perirectal abscess    ??? Pica    ??? PUD (peptic ulcer disease)    ??? Pulmonary embolism (HCC)    ??? Renal failure    ??? Sleep apnea        Past Surgical History:   Procedure Laterality Date   ??? KIDNEY TRANSPLANT  2009   ??? CENTRAL VENOUS CATHETER      hemodialysis catheter placement   ??? TUNNELED VENOUS PORT PLACEMENT         Family History  Family History   Problem Relation Age of Onset   ??? Hypertension Mother    ??? None Reported Brother          Social History   Social History     Socioeconomic History   ??? Marital status: Single Spouse name: Not on file   ??? Number of children: 0   ??? Years of education: 100   ??? Highest education level: Not on file   Social Needs   ??? Financial resource strain: Not on file   ??? Food insecurity - worry: Not on file   ??? Food insecurity - inability: Not on file   ??? Transportation needs - medical: Not on file   ??? Transportation needs - non-medical: Not on file   Occupational History     Comment: unemployed   Tobacco Use   ??? Smoking status: Never Smoker   ??? Smokeless tobacco: Never Used   Substance and Sexual Activity   ??? Alcohol use: No   ??? Drug use: No   ??? Sexual activity: Yes   Other Topics Concern   ??? Military Service Not Asked   ??? Blood Transfusions Yes   ??? Caffeine Concern Not Asked   ??? Occupational Exposure Not Asked   ??? Hobby Hazards Not Asked   ??? Sleep Concern Not Asked   ??? Stress Concern Not Asked   ??? Weight Concern Not Asked   ??? Special Diet Not Asked   ??? Back Care Not Asked   ??? Exercise No   ??? Bike Helmet Not Asked   ??? Seat Belt Not Asked   ??? Self-Exams Not Asked   Social History Narrative   ??? Not on file         Medications  MEDS    calcium acetate 1,334 mg Oral TID w/ meals   ceFAZolin  (ANCEF)  IV 1.25 g Intravenous Q12H*   cetirizine 5 mg Oral QAM8   cyclobenzaprine 5 mg Oral TID   lidocaine 1 patch Topical QDAY   pantoprazole DR 40 mg Oral BID(11-21)   senna/docusate 10 mL Oral BID   sevelamer carbonate 800 mg Oral TID w/ meals   vitamins, multi B, C, Zn & folate (Renal) 1 tablet Oral QDAY    IV MEDS    Prn emu PRN, fentaNYL citrate PF Q6H PRN 25 mcg at 01/22/17 1100, HYDROcodone/acetaminophen Q4H PRN 1 tablet at 01/22/17 1442, ondansetron (ZOFRAN) IV Q6H PRN 4 mg at 01/22/17 1100, tiZANidine Q8H PRN 4 mg at 01/19/17 0527     HOME MEDS  Prior to  Admission Medications   Prescriptions Last Dose Informant Patient Reported? Taking?   amLODIPine (NORVASC) 10 mg tablet   Yes No   Sig: Take 10 mg by mouth daily.   calcitriol (ROCALTROL) 0.5 mcg capsule   Yes No   Sig: Take 1 mcg by mouth daily. calcium acetate (PHOSLO) 667 mg capsule   Yes No   Sig: Take 1,334 mg by mouth three times daily with meals.   carvedilol (COREG) 25 mg tablet   Yes No   Sig: Take 25 mg by mouth twice daily with meals. Take with food.   cetirizine (ZYRTEC) 5 mg tablet   Yes No   Sig: Take 5 mg by mouth every morning.   fosinopril (MONOPRIL) 40 mg tablet   Yes No   Sig: Take 40 mg by mouth daily.   polyethylene glycol 3350 (MIRALAX) 17 g packet   Yes No   Sig: Take 17 g by mouth daily as needed.   potassium chloride SR (K-DUR) 10 mEq tablet   Yes No   Sig: Take 10 mEq by mouth daily. Take with a meal and a full glass of water.      Facility-Administered Medications: None              Physical Exam        Vital Signs: Last Filed In 24 Hours Vital Signs: 24 Hour Range   BP: 130/84 (12/15 1200)  Temp: 37.6 ???C (99.7 ???F) (12/15 1200)  Pulse: 109 (12/15 1200)  Respirations: 21 PER MINUTE (12/15 1000)  SpO2: 100 % (12/15 1200)  O2 Delivery: Nasal Cannula (12/15 1200)  SpO2 Pulse: 119 (12/15 1000) BP: (102-161)/(73-116)   Temp:  [36.5 ???C (97.7 ???F)-38.1 ???C (100.5 ???F)]   Pulse:  [97-141]   Respirations:  [14 PER MINUTE-29 PER MINUTE]   SpO2:  [88 %-100 %]   O2 Delivery: Nasal Cannula   Intensity Pain Scale (Self Report): 10 (01/22/17 0800)      Vitals:    01/20/17 1604 01/21/17 1500 01/21/17 1915   Weight: 115.8 kg (255 lb 4.7 oz) 118.4 kg (261 lb 0.4 oz) 117.5 kg (259 lb 0.7 oz)       Intake/Output Summary (Last 24 hours) at 01/22/2017 1636  Last data filed at 01/22/2017 1315  Gross per 24 hour   Intake 772 ml   Output 2000 ml   Net -1228 ml        General:  Alert, cooperative, obese female  Head:  Normocephalic, without obvious abnormality, atraumatic  Eyes:  Conjunctivae/corneas clear.  PERRL, EOMs intact.  Neck:  Supple  Chest: port  Lungs:  CTAB anteriorly   Heart:    RRR; S1, S2 normal; No M/R/G  Abdomen:  diffusely tender to palpation; soft. Dark pink fluid in PD catheter tip.  Extremities: edema; LLE thigh tunneled HD Skin:   Dry skin; No rashes or lesions  Neurologic:  CNII - XII grossly intact.       Labs     Recent Labs      01/19/17   2030  01/20/17   0345  01/20/17   0830  01/20/17   1124  01/20/17   1418  01/20/17   2235  01/21/17   0130  01/21/17   0352  01/21/17   2010  01/22/17   0340   NA  134*  131*  137  135*  135*  133*  135*  137   --   136*   K  5.0  6.6*  3.8  4.2  4.6  4.5  4.4  4.2   --   3.8   CL  94*  95*  102  96*  96*  96*  96*  96*   --   99   CO2  23  18*  20*  21  20*  28  27  26    --   29   GAP  17*  18*  15*  18*  19*  9  12  15*   --   8   BUN  37*  39*  37*  42*  42*  23  23  23    --   13   CR  15.04*  14.37*  13.16*  14.56*  15.50*  9.54*  10.15*  9.73*   --   6.42*   GLU  134*  116*  97  113*  124*  115*  118*  123*   --   106*   CA  5.6*  6.0*  5.2*  6.8*  6.5*  6.8*  6.6*  6.4*   --   7.3*   ALBUMIN  2.8*  2.9*   --    --    --    --    --   3.0*   --   3.1*   MG  1.8  1.8   --    --    --    --    --   1.8   --   1.9   PO4  10.1*   --    --    --    --    --    --    --   3.2   --        Recent Labs      01/19/17   2018  01/19/17   2030  01/19/17   2048  01/20/17   0027  01/20/17   0345  01/20/17   0830  01/20/17   1418  01/20/17   2235  01/21/17   0130  01/21/17   0352  01/21/17   1022  01/21/17   2010  01/22/17   0340  01/22/17   1204  01/22/17   1520   WBC  12.8*   --    --   18.3*   --   16.2*  18.3*  14.4*  14.3*  14.0*  13.1*  14.0*  13.6*  14.3*  12.8*   HGB  6.1*   --    --   6.2*   --   6.6*  9.1*  7.8*  7.7*  7.7*  6.7*  8.3*  8.1*  7.8*  7.7*   HCT  18.9*   --    --   19.0*   --   19.9*  26.8*  23.0*  22.8*  22.7*  19.8*  24.3*  24.3*  23.4*  22.8*   PLTCT  240   --    --   282   --   207  180  249  226  235  201  202  209  219  218   INR   --   1.3*   --    --   1.3*   --    --    --    --   1.9*   --    --   2.7*   --    --  PTT   --   31.0   --    --   30.1   --    --    --    --   34.0   --    --   34.2   --    -- AST   --   39   --    --   40   --    --    --    --   25   --    --   17   --    --    ALT   --   15   --    --   12   --    --    --    --   5*   --    --   <3*   --    --    ALKPHOS   --   72   --    --   75   --    --    --    --   75   --    --   69   --    --    TNI   --    --   0.01  0.01   --    --    --    --    --    --    --    --    --    --    --       Estimated Creatinine Clearance: 16.9 mL/min (A) (based on SCr of 6.42 mg/dL (H)).  Vitals:    01/20/17 1604 01/21/17 1500 01/21/17 1915   Weight: 115.8 kg (255 lb 4.7 oz) 118.4 kg (261 lb 0.4 oz) 117.5 kg (259 lb 0.7 oz)    No results for input(s): PHART, PO2ART in the last 72 hours.    Invalid input(s): PC02A                        Radiology     Pertinent radiology reviewed.

## 2017-01-23 LAB — CBC AND DIFF
Lab: 11 10*3/uL — ABNORMAL HIGH (ref 4.5–11.0)
Lab: 16 % — ABNORMAL HIGH (ref 11–15)
Lab: 182 10*3/uL (ref 150–400)
Lab: 2.3 M/UL — ABNORMAL LOW (ref 4.0–5.0)
Lab: 21 % — ABNORMAL LOW (ref 36–45)
Lab: 30 pg (ref 26–34)
Lab: 33 g/dL (ref 32.0–36.0)
Lab: 7 FL (ref 7–11)
Lab: 7.2 g/dL — ABNORMAL LOW (ref 12.0–15.0)
Lab: 90 FL (ref 80–100)
Lab: 0.1 10*3/uL (ref 0–0.20)
Lab: 0.2 10*3/uL (ref 0–0.45)
Lab: 1 % (ref 0–2)
Lab: 1 10*3/uL — ABNORMAL HIGH (ref 0–0.80)
Lab: 1.3 10*3/uL (ref 1.0–4.8)
Lab: 10 % — ABNORMAL LOW (ref 24–44)
Lab: 10 K/UL — ABNORMAL HIGH (ref 1.8–7.0)
Lab: 13 K/UL — ABNORMAL HIGH (ref <100)
Lab: 16 % — ABNORMAL HIGH (ref >60)
Lab: 2 % (ref 0–5)
Lab: 223 10*3/uL — ABNORMAL LOW (ref >60)
Lab: 23 % — ABNORMAL LOW (ref 36–45)
Lab: 33 g/dL — ABNORMAL HIGH (ref 32.0–36.0)
Lab: 6.4 FL — ABNORMAL LOW (ref 7–11)
Lab: 7.6 g/dL — ABNORMAL LOW (ref >60)
Lab: 79 % — ABNORMAL HIGH (ref 41–77)
Lab: 8 % (ref 4–12)
Lab: 92 FL — ABNORMAL HIGH (ref 80–100)
Lab: 12 K/UL — ABNORMAL HIGH (ref >60)
Lab: 0 % (ref 0–2)
Lab: 11 K/UL — ABNORMAL HIGH (ref 4.5–11.0)
Lab: 12 % — ABNORMAL LOW (ref 24–44)
Lab: 16 % — ABNORMAL HIGH (ref >60)
Lab: 2 % (ref 0–5)
Lab: 2.4 M/UL — ABNORMAL LOW (ref 4.0–5.0)
Lab: 23 % — ABNORMAL LOW (ref 36–45)
Lab: 235 K/UL — ABNORMAL LOW (ref >60)
Lab: 30 pg — ABNORMAL HIGH (ref 26–34)
Lab: 33 g/dL (ref 32.0–36.0)
Lab: 6.5 FL — ABNORMAL LOW (ref 7–11)
Lab: 7.7 g/dL — ABNORMAL LOW (ref 12.0–15.0)
Lab: 78 % — ABNORMAL HIGH (ref 41–77)
Lab: 8 % (ref 4–12)
Lab: 8.8 10*3/uL — ABNORMAL HIGH (ref 1.8–7.0)
Lab: 92 FL — ABNORMAL HIGH (ref 80–100)
Lab: 0 10*3/uL (ref 0–0.20)
Lab: 10 10*3/uL (ref 4.5–11.0)
Lab: 2.4 M/UL — ABNORMAL LOW (ref 4.0–5.0)

## 2017-01-23 LAB — PHOSPHORUS: Lab: 5.6 mg/dL — ABNORMAL HIGH (ref >60)

## 2017-01-23 LAB — CULTURE-BLOOD W/SENSITIVITY

## 2017-01-23 LAB — PROTIME INR (PT): Lab: 3 M/UL — ABNORMAL HIGH (ref >60)

## 2017-01-23 LAB — MAGNESIUM: Lab: 2.3 mg/dL — ABNORMAL LOW (ref >60)

## 2017-01-23 LAB — COMPREHENSIVE METABOLIC PANEL: Lab: 134 MMOL/L — ABNORMAL LOW (ref >60)

## 2017-01-23 LAB — PTT (APTT): Lab: 37 s — ABNORMAL HIGH (ref 24.0–36.5)

## 2017-01-23 MED ORDER — IOHEXOL 350 MG IODINE/ML IV SOLN
100 mL | Freq: Once | INTRAVENOUS | 0 refills | Status: CP
Start: 2017-01-23 — End: ?
  Administered 2017-01-23: 07:00:00 100 mL via INTRAVENOUS

## 2017-01-23 MED ORDER — SODIUM CHLORIDE 0.9 % IJ SOLN
50 mL | Freq: Once | INTRAVENOUS | 0 refills | Status: CP
Start: 2017-01-23 — End: ?
  Administered 2017-01-23: 07:00:00 50 mL via INTRAVENOUS

## 2017-01-23 NOTE — Progress Notes
Renal Progress Note    Name:  Debra Lam   AVWUJ'[id number] Date:  01/23/2017  Admission Date: 01/12/2017  LOS: 11 days          Assessment and Plan   Principal Problem:    CLABSI (central line-associated bloodstream infection)  Active Problems:    ESRD (end stage renal disease) on dialysis (HCC)    Morbid obesity (HCC)    HTN (hypertension)    OSA (obstructive sleep apnea)    Anemia    Acute deep vein thrombosis (DVT) of femoral vein of right lower extremity (HCC)        Debra Lam is a 21 y.o. 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  Sepsis  - febrile, leukocytosis, and tachycardic on admission               - has port, RLE tunneled HD (pulled), and PD catheter  Group B Strep bacteremia and S. aureus growing from [facility] tip, which was d/c 12/6  ???  Retroperitoneal bleed  - CT abd 12/12 with retroperitoneal bleed --> STAT IR: Right renal artery: Widely patent right renal artery. Multiple small areas of active contrast extravasation involving superior pole, interpolar, and lower pole renal artery branches s/p embolization  - CT abd 12/15  redistribution of the hematoma but no expansions.  A small amount of fluid was seen which was considered a dilute hemoperitoneum.  ???  DVT  - Partially occlusive acute appearing thrombus within the right external iliac and right common femoral veins  ???  Severe right thigh pain - resolved following HD catheter removal  ???  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  SOB: +ve  parainfluenza   ???  HTN  OSA  Hx of PE  - 2016, likely provoked???while on OCP  - Was on eliquis, not currently on anticoagulation  ???  Iron deficiency anemia  Tsat 10%, ferritin 610 likely due to acute infection  ???  Hemoperitoneum  ???  Recommendations:  Continue treatment for line sepsis- ID following  Plan for next HD on Monday  Given unclear etiology for spontaneous renal artery branches (on anticoagulation) --> would re-image with CT abd/pelvis in future once hemorrhage has resorbed to exclude underlying abnormalities, such as renal cell carcinoma   Very complex decision making in setting of multiple comorbidities. Case discussed with pt and her mom    Otis Peak, MD  Pager [phone]      Subjective     Debra Lam is a 21 y.o. female   Concern for intraperitoneal expansion of pararenal bleed last night after peritoneal fluid revealed to be grossly bloody. Gram stain shows neutrophils but no organisms.  CT ABD pelvis showed redistribution of the hematoma but no expansions.  A small amount of fluid was seen which was considered a dilute hemoperitoneum.  Hemoglobin stable. Patient overall stable with no new complaints overnight 12/16          Medications  Medications    MEDS  calcium acetate 1,334 mg Oral TID w/ meals   ceFAZolin  (ANCEF)  IV 1.25 g Intravenous Q12H*   cetirizine 5 mg Oral QAM8   cyclobenzaprine 5 mg Oral TID   lidocaine 1 patch Topical QDAY   lidocaine 1 patch Topical QDAY   pantoprazole DR 40 mg Oral BID(11-21)   senna/docusate 10 mL Oral BID   sevelamer carbonate 800 mg Oral TID w/ meals   vitamins, multi B, C, Zn & folate (Renal) 1 tablet Oral QDAY    IV MEDS  Prn emu PRN, fentaNYL citrate PF Q6H PRN 25 mcg at 01/23/17 1723, HYDROcodone/acetaminophen Q4H PRN 2 tablet at 01/23/17 1533, ondansetron (ZOFRAN) IV Q6H PRN 4 mg at 01/23/17 0925, tiZANidine Q8H PRN 4 mg at 01/19/17 0527           Physical Exam Vital Signs: Last Filed In 24 Hours Vital Signs: 24 Hour Range   BP: 134/87 (12/16 1413)  Temp: 37.1 ???C (98.7 ???F) (12/16 1413)  Pulse: 103 (12/16 1413)  Respirations: 18 PER MINUTE (12/16 1413)  SpO2: 95 % (12/16 1413)  O2 Delivery: None (Room Air) (12/16 1413)  SpO2 Pulse: 117 (12/16 0300) BP: (129-135)/(81-89)   Temp:  [36.8 ???C (98.3 ???F)-37.4 ???C (99.3 ???F)]   Pulse:  [103-117]   Respirations:  [18 PER MINUTE-22 PER MINUTE]   SpO2:  [95 %-100 %]   O2 Delivery: None (Room Air)   Intensity Pain Scale (Self Report): 9 (01/23/17 1413)        Intake/Output Summary (Last 24 hours) at 01/23/2017 1739  Last data filed at 01/23/2017 1241  Gross per 24 hour   Intake 530 ml   Output 0 ml   Net 530 ml      Vitals:    01/20/17 1604 01/21/17 1500 01/21/17 1915   Weight: 115.8 kg (255 lb 4.7 oz) 118.4 kg (261 lb 0.4 oz) 117.5 kg (259 lb 0.7 oz)       Gen: Alert and Oriented, No Acute Distress   HEENT: Sclera normal; MMM, neck keloid  CV: no JVD, S1 and S2 normal, no rubs, murmurs or gallops   Pulm: Clear to Auscultation bilateral   GI: BS+ x4, non-tender to palpation  Neuro: Grossly normal, moving all extremities, speech intact  Ext: no edema, clubbing or cyanosis   Skin: no rash   + femoral HD CVC      Labs:      Recent Labs      01/20/17   2235  01/21/17   0130  01/21/17   0352  01/21/17   2010  01/22/17   0340  01/23/17   0322   NA  133*  135*  137   --   136*  134*   K  4.5  4.4  4.2   --   3.8  3.6   CL  96*  96*  96*   --   99  97*   CO2  28  27  26    --   29  27   GAP  9  12  15*   --   8  10   BUN  23  23  23    --   13  23   CR  9.54*  10.15*  9.73*   --   6.42*  8.95*   GLU  115*  118*  123*   --   106*  95   CA  6.8*  6.6*  6.4*   --   7.3*  7.1*   ALBUMIN   --    --   3.0*   --   3.1*  2.9*   MG   --    --   1.8   --   1.9  2.3   PO4   --    --    --   3.2   --   5.6*       Recent Labs      01/20/17   2235  01/21/17   0130  01/21/17   0352  01/21/17   1022  01/21/17   2010  01/22/17   0340  01/22/17   1204  01/22/17 1520  01/22/17   2030  01/22/17   2343  01/23/17   0322  01/23/17   0905   WBC  14.4*  14.3*  14.0*  13.1*  14.0*  13.6*  14.3*  12.8*  11.9*  13.0*  12.1*  11.2*   HGB  7.8*  7.7*  7.7*  6.7*  8.3*  8.1*  7.8*  7.7*  7.2*  7.6*  7.4*  7.7*   HCT  23.0*  22.8*  22.7*  19.8*  24.3*  24.3*  23.4*  22.8*  21.1*  23.0*  22.5*  23.1*   PLTCT  249  226  235  201  202  209  219  218  182  223  224  235   INR   --    --   1.9*   --    --   2.7*   --    --    --    --   3.0*   --    PTT   --    --   34.0   --    --   34.2   --    --    --    --   37.7*   --    AST   --    --   25   --    --   17   --    --    --    --   15   --    ALT   --    --   5*   --    --   <3*   --    --    --    --   <3*   --    ALKPHOS   --    --   75   --    --   69   --    --    --    --   71   --       Estimated Creatinine Clearance: 12.1 mL/min (A) (based on SCr of 8.95 mg/dL (H)).  Vitals:    01/20/17 1604 01/21/17 1500 01/21/17 1915   Weight: 115.8 kg (255 lb 4.7 oz) 118.4 kg (261 lb 0.4 oz) 117.5 kg (259 lb 0.7 oz)    No results for input(s): PHART, PO2ART in the last 72 hours.    Invalid input(s): PC02A

## 2017-01-23 NOTE — Progress Notes
Patient arrived to room # (5311-1 then tx to 5317) via wheelchair accompanied by RN. Patient transferred to the bed with assistance. Bedside safety checks completed. Initial patient assessment completed, refer to flowsheet for details. Admission skin assessment completed by: Arther AbbottNicole Exzavier Ruderman, RN and Mindi CurlingKatie Douglas, RN    Pressure Injury Present on Hospital Admission (within 24 hours): Yes    1. Occiput: No  2. Ear: No  3. Scapula: No  4. Spinous Process: No  5. Shoulder: No  6. Elbow: No  7. Iliac Crest: No  8. Sacrum/Coccyx: Yes  9. Ischial Tuberosity: No  10. Trochanter: No  11. Knee: No  12. Malleolus: No  13. Heel: No  14. Toes: No  15. Assessed for device associated injury Yes  16. Nursing Nutrition Assessment Completed Yes    See Doc Flowsheet for additional wound details.     INTERVENTIONS:  Skin break/pressure injury present on coccyx. Encourage patient to ambulate and apply criticaid.

## 2017-01-24 DIAGNOSIS — T80211A Bloodstream infection due to central venous catheter, initial encounter: Principal | ICD-10-CM

## 2017-01-24 LAB — PHOSPHORUS: Lab: 6.5 mg/dL — ABNORMAL HIGH (ref 2.0–4.5)

## 2017-01-24 LAB — CELL COUNT W/DIFF-FLUIDS
Lab: 163 /uL
Lab: 3 %
Lab: 520 /uL
Lab: 77 %

## 2017-01-24 LAB — MAGNESIUM: Lab: 2.5 mg/dL — ABNORMAL LOW (ref 1.6–2.6)

## 2017-01-24 LAB — PROTIME INR (PT): Lab: 2 mg/dL — ABNORMAL HIGH (ref 0.8–1.2)

## 2017-01-24 LAB — CBC AND DIFF: Lab: 9.9 K/UL — ABNORMAL HIGH (ref 4.5–11.0)

## 2017-01-24 LAB — COMPREHENSIVE METABOLIC PANEL: Lab: 133 MMOL/L — ABNORMAL LOW (ref >60)

## 2017-01-24 LAB — PTT (APTT): Lab: 35 s — ABNORMAL HIGH (ref 24.0–36.5)

## 2017-01-24 MED ORDER — ANTICOAG SODIUM CITRATE SOLN 5ML
1-5 mL | Freq: Once | 0 refills | Status: CP
Start: 2017-01-24 — End: ?

## 2017-01-24 MED ORDER — SODIUM CHLORIDE 0.9 % IV SOLP
1000 mL | INTRAVENOUS | 0 refills | Status: DC | PRN
Start: 2017-01-24 — End: 2017-01-25

## 2017-01-24 MED ORDER — SODIUM CHLORIDE 0.9 % IV SOLP
250 mL | INTRAVENOUS | 0 refills | Status: DC | PRN
Start: 2017-01-24 — End: 2017-01-25

## 2017-01-24 MED ORDER — SODIUM CHLORIDE 0.9 % IV SOLP
300 mL | INTRAVENOUS | 0 refills | Status: CP | PRN
Start: 2017-01-24 — End: ?
  Administered 2017-01-24: 22:00:00 300 mL via INTRAVENOUS

## 2017-01-24 MED ORDER — AMLODIPINE 10 MG PO TAB
10 mg | Freq: Every day | ORAL | 0 refills | Status: DC
Start: 2017-01-24 — End: 2017-01-29
  Administered 2017-01-25 – 2017-01-28 (×5): 10 mg via ORAL

## 2017-01-24 MED ORDER — CEFAZOLIN INJ 1GM IVP
2 g | Freq: Every day | INTRAVENOUS | 0 refills | Status: DC
Start: 2017-01-24 — End: 2017-01-28
  Administered 2017-01-24 – 2017-01-27 (×4): 2 g via INTRAVENOUS

## 2017-01-24 MED ADMIN — ANTICOAG SODIUM CITRATE SOLN 5ML [210585]: 5 mL | @ 22:00:00 | Stop: 2017-01-24 | NDC 54029018302

## 2017-01-24 MED ADMIN — FENTANYL CITRATE (PF) 50 MCG/ML IJ SOLN [3037]: 25 ug | INTRAVENOUS | @ 21:00:00 | Stop: 2017-01-24 | NDC 00641602701

## 2017-01-24 NOTE — Progress Notes
Text page md Pt.'s bp 154/96.

## 2017-01-24 NOTE — Transfer Summaries
In-Hospital Transfer Note    Admission Diagnosis:  Sepsis, Acute on Chronic kidney failure  Admission Date: 01/12/2017  Active Hospital Problem List:  Principal Problem:    CLABSI (central line-associated bloodstream infection)  Active Problems:    ESRD (end stage renal disease) on dialysis (HCC)    Morbid obesity (HCC)    HTN (hypertension)    OSA (obstructive sleep apnea)    Anemia    Acute deep vein thrombosis (DVT) of femoral vein of right lower extremity [facility])    Hospital Course:    Patient initially admitted to ICU on 01/12/17 for sepsis and acute on chronic renal failure.  R femoral HD catheter culture growing Staph aureus, renal failure with urgent need for dialysis.  The right femoral line was used for HD prior to cultures resulting.  She stabilized after HD and was moved to the floor.  Her R femoral line was removed and she was on peritoneal dialysis for a time.  In the interim, she developed a provoked DVT on the R side.  She was on [facility] and transitioned to Eliquis for DVT.  On 12/12 patient reporting increasing abdominal pain and a CT abdomen showed a right pararenal space hematoma.  Patient became hypotensive and the RRT was called.  Patient received 4u of PRBCs.  IR was called and embolized branches of the right renal artery.  Peritoneal dialysis was no longer an option and IR was again consulted for left tunneled HD cath placement.  With provoked DVT and active bleed, IR placed an IVC filter at the same time.  Patient was successfully dialyzed after the catheter was placed.  Patient received 2u more of PRBCs the following day for Hgb <7 with appropriate response.  The rest of patient's ICU course was for monitoring and pain control.  On 12/16, there was concern for intraperitoneal extension of patient's hematoma after peritoneal fluid returned grossly bloody.  CTA abd/pelvis showed redistribution of the hematoma and renal necrosis from embolization but no further expansion of bleeding. Patient's hemoglobin stable.  Ready for transfer to floor.   Significant Medication Information (to include antibiotic duration/indication, anticoagulation and steroids, etc.):  Cefazolin for line infection x4 weeks per ID  Hematology outlined recommendations regarding [facility] plan in 12/13 note.  Expecting Eliquis but question if needs heparin prior or not.   Procedures With Dates:  IVC Filter placement with Left femoral tunneled HD catheter placement 01/20/17  Hemodialysis 12/14-12/13-12/7-12/6  Peritoneal Dialysis 12/10-12/11  EGD 12/10  Consults:  Wound Team, Hematology, Gastroenterology, Infectious Disease, Renal  Follow-Up Items:  Anticoagulation plan   Activity/Weight bearing status:  As tolerated  Nutrition:  Tolerates PO  Discharge Plan:  Ongoing  Stephanie Coup. Sibyl Parr, DO  Pager [phone]

## 2017-01-24 NOTE — Care Coordination-Inpatient
Patient has been assigned 5317-01. Patient will go to [facility]. Please page [facility] pager after 0800 for checkout.    Candelaria CelesteJessup Catori Panozzo, MD  Internal Medicine

## 2017-01-24 NOTE — Case Management (ED)
Case Management Progress Note    NAME:Debra Ina HomesMonique Lam                          MRN: [id number]              DOB:08/06/1995          AGE: 21 y.o.  ADMISSION DATE: 01/12/2017             DAYS ADMITTED: LOS: 12 days      Todays Date: 01/24/2017    Plan  Anticipate DC with IV abx, HHC, and outpatient HD.      Interventions  ? Support      ? Info or Referral      ? Discharge Planning   Discharge Planning: OP HD or PD, Home Health, Home Infusion-[facility]   NCM update [facility] regarding POC and anticipated DC.  NCM update [facility] regariding POC and anticipated DC.  NCM left VM with [facility] liason regarding anticipated DC plan for HD with patient.    ? Medication Needs      ? Financial      ? Legal      ? Other        Disposition  ? Expected Discharge Date    Expected Discharge Date: 01/31/17  ? Transportation   Does the patient need discharge transport arranged?: No  Transportation Name, Phone and Availability #1: Johny DrillingJermaine Murphy (brother) - [phone] or Asher MuirJamie (sister-in-law) - [phone]  ? Next Level of Care (Acute Psych discharges only)      ? Discharge Disposition                                          Durable Medical Equipment      No service has been selected for the patient.      Clay Destination      No service has been selected for the patient.      [facility] - Selection Complete      Service Provider Request Status Selected Services Address Phone Number Fax Number    [facility] Home [facility] Services [address], [place] [id number] [phone]      [facility] - Selection Complete      Service Provider Request Status Selected Services Address Phone Number Fax Number    [facility] Selected [facility] and [facility] [address], [place] [id number] [phone]          Ellamae Siauane Molleigh Huot RN BSN  Integrated Nurse Case Manager  [phone]/ [phone]

## 2017-01-24 NOTE — Procedures
1230: Report received from primary nurse, Fleet Contrasachel and entered into transport. However, when transport arrived to bring her to dialysis, pt was eating and did not want to come to dialysis until after her meal. Pt re-entered into transport and arrived at 1330. ID and consent verified. Attached to cardiac, BP and oximetry monitors. Left femoral tunneled CVC accessed, aspirates and flushes without difficulty.  1345: Dialysis started. Ordered blood flow easily attained.  1400: Dr Hulan AmatoMustafa at bedside, updated.  1500: Dr Lester Carolinaorosyan here  1515: c/o right side abd pain, rates 9/10. Requested and given Fentanyl.  1600: Sleeping, snoring at intervals  1630: Continues to sleep unless disturbed, awakens easily and is oriented when awake  1715: Dialysis complete. Blood returned with NS rinse. CVC port flushed, packed and capped then wrapped in gauze, secured with paper tape. Dressing changed to dialysis CVC and PD catheter  1730: Transported back to room per bed.  1800: Report called to primary nurse, Fleet Contrasachel, RN

## 2017-01-24 NOTE — Progress Notes
Renal Progress Note    Name:  Debra Lam   RUEAV'W Date:  01/24/2017  Admission Date: 01/12/2017  LOS: 12 days          Assessment and Plan   Principal Problem:    CLABSI (central line-associated bloodstream infection)  Active Problems:    ESRD (end stage renal disease) on dialysis (HCC)    Morbid obesity (HCC)    HTN (hypertension)    OSA (obstructive sleep apnea)    Anemia    Acute deep vein thrombosis (DVT) of femoral vein of right lower extremity (HCC)        Debra Lam is a 21 y.o. 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  Sepsis  - febrile, leukocytosis, and tachycardic on admission               - has port, RLE tunneled HD (pulled), and PD catheter  Group B Strep bacteremia and S. aureus growing from [facility] tip, which was d/c 12/6  ???  Retroperitoneal bleed  - CT abd 12/12 with retroperitoneal bleed --> STAT IR: Right renal artery: Widely patent right renal artery. Multiple small areas of active contrast extravasation involving superior pole, interpolar, and lower pole renal artery branches s/p embolization  - CT abd 12/15  redistribution of the hematoma but no expansions.  A small amount of fluid was seen which was considered a dilute hemoperitoneum.  ???  DVT  - Partially occlusive acute appearing thrombus within the right external iliac and right common femoral veins  ???  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 SOB: +ve  parainfluenza - resolved   ???  HTN  OSA  Hx of PE  - 2016, likely provoked???while on OCP  - Was on eliquis, not currently on anticoagulation  ???  Iron deficiency anemia  Tsat 10%, ferritin 610 likely due to acute infection  ???  Hemoperitoneum  ???  Recommendations:  - Given unclear etiology for spontaneous renal artery branches (on anticoagulation) --> would re-image with CT abd/pelvis in future once hemorrhage has resorbed to exclude underlying abnormalities, such as renal cell carcinoma    - HD today; next plan for Wednesday (plan for M/W/F)   - ID managing abx    pt discussed with Dr. Holley Raring, MD  Nephrology fellow  Pager [phone]      Subjective     Debra Lam is a 21 y.o. female     Pt seen during dialysis; tolerating treatment well; no new acute complaints.         Medications     Medications    MEDS    calcium acetate 1,334 mg Oral TID w/ meals   ceFAZolin  (ANCEF)  IV 2 g Intravenous QDAY(17)   cetirizine 5 mg Oral QAM8   cyclobenzaprine 5 mg Oral TID   lidocaine 1 patch Topical QDAY   lidocaine 1 patch Topical QDAY   pantoprazole DR  40 mg Oral BID(11-21)   senna/docusate 10 mL Oral BID   sevelamer carbonate 800 mg Oral TID w/ meals   vitamins, multi B, C, Zn & folate (Renal) 1 tablet Oral QDAY    IV MEDS  Prn emu PRN, fentaNYL citrate PF Q6H PRN 25 mcg at 01/24/17 1514, HYDROcodone/acetaminophen Q4H PRN 2 tablet at 01/24/17 1302, ondansetron (ZOFRAN) IV Q6H PRN 4 mg at 01/24/17 1144, sodium chloride 0.9% (NS) IP Dialysis PRN, sodium chloride 0.9% (NS) IP Dialysis PRN, tiZANidine Q8H PRN 4 mg at 01/19/17 0527           Physical Exam     Vital Signs: Last Filed In 24 Hours Vital Signs: 24 Hour Range   BP: 162/112 (12/17 1700)  Temp: 37.1 ???C (98.7 ???F) (12/17 1330)  Pulse: 109 (12/17 1700)  Respirations: 19 PER MINUTE (12/17 1700)  SpO2: 99 % (12/17 1700)  O2 Delivery: None (Room Air) (12/17 1700)  SpO2 Pulse: 109 (12/17 1700) BP: (131-181)/(87-130) Temp:  [36.5 ???C (97.7 ???F)-37.1 ???C (98.7 ???F)]   Pulse:  [99-121]   Respirations:  [15 PER MINUTE-26 PER MINUTE]   SpO2:  [95 %-100 %]   O2 Delivery: None (Room Air)   Intensity Pain Scale (Self Report): 9 (01/24/17 1515)        Intake/Output Summary (Last 24 hours) at 01/24/2017 1732  Last data filed at 01/24/2017 1349  Gross per 24 hour   Intake 1140 ml   Output 0 ml   Net 1140 ml      Vitals:    01/21/17 1915 01/24/17 0613 01/24/17 1330   Weight: 117.5 kg (259 lb 0.7 oz) 115.3 kg (254 lb 3.1 oz) 120 kg (264 lb 8.8 oz)       Gen: Alert and Oriented, No Acute Distress   HEENT: Sclera normal; MMM, neck keloid  CV: no JVD, S1 and S2 normal, no rubs, murmurs or gallops   Pulm: Clear to Auscultation bilateral   GI: BS+ x4, non-tender to palpation  Neuro: Grossly normal, moving all extremities, speech intact  Ext: no edema, clubbing or cyanosis   Skin: no rash   + femoral HD CVC      Labs:      Recent Labs      01/21/17   2010  01/22/17   0340  01/23/17   0322  01/24/17   0401   NA   --   136*  134*  133*   K   --   3.8  3.6  3.6   CL   --   99  97*  96*   CO2   --   29  27  26    GAP   --   8  10  11    BUN   --   13  23  29*   CR   --   6.42*  8.95*  11.43*   GLU   --   106*  95  93   CA   --   7.3*  7.1*  7.1*   ALBUMIN   --   3.1*  2.9*  2.9*   MG   --   1.9  2.3  2.5   PO4  3.2   --   5.6*  6.5*       Recent Labs      01/21/17   2010  01/22/17   0340  01/22/17   1204  01/22/17   1520  01/22/17  2030  01/22/17   2343  01/23/17   0322  01/23/17   0905  01/23/17   1630  01/24/17   0401   WBC  14.0*  13.6*  14.3*  12.8*  11.9*  13.0*  12.1*  11.2*  10.4  9.9   HGB  8.3*  8.1*  7.8*  7.7*  7.2*  7.6*  7.4*  7.7*  7.6*  7.2*   HCT  24.3*  24.3*  23.4*  22.8*  21.1*  23.0*  22.5*  23.1*  22.7*  21.8*   PLTCT  202  209  219  218  182  223  224  235  252  266   INR   --   2.7*   --    --    --    --   3.0*   --    --   2.0*   PTT   --   34.2   --    --    --    --   37.7*   --    --   35.2 AST   --   17   --    --    --    --   15   --    --   14   ALT   --   <3*   --    --    --    --   <3*   --    --   <3*   ALKPHOS   --   69   --    --    --    --   71   --    --   80      Estimated Creatinine Clearance: 9.6 mL/min (A) (based on SCr of 11.43 mg/dL (H)).  Vitals:    01/21/17 1915 01/24/17 0613 01/24/17 1330   Weight: 117.5 kg (259 lb 0.7 oz) 115.3 kg (254 lb 3.1 oz) 120 kg (264 lb 8.8 oz)    No results for input(s): PHART, PO2ART in the last 72 hours.    Invalid input(s): PC02A

## 2017-01-24 NOTE — Progress Notes
General Progress Note    Name:  Debra Lam   Today's Date:  01/24/2017  Admission Date: 01/12/2017  LOS: 12 days                     Assessment/Plan:    Principal Problem:    CLABSI (central line-associated bloodstream infection)  Active Problems:    ESRD (end stage renal disease) on dialysis (HCC)    Morbid obesity (HCC)    HTN (hypertension)    OSA (obstructive sleep apnea)    Anemia    Acute deep vein thrombosis (DVT) of femoral vein of right lower extremity [facility])    Debra Lam is a 21 year old African-American female, originally from [place],???with past medical history of hypertension, peptic ulcer disease, pulmonary embolism, congenital hypoplastic kidney, 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     1. Right external iliac and right common femoral vein thrombosis, acute:  - Appears provocative due to presence of the HD catheter; now removed  - Had PE in 2017, was treated with coumadin but developed rash/hives, switched to eliquis which she used for 2-3 months (unsure of duration)  - Was started on heparin gtt with a plan to transition to eliquis for long term use  - Had spontaneous bleeding in the retroperitoneal space, so AC discontinued 12/12 and IVC filter placed by IR 12/13  ???  2. Right sided retroperitoneal hemorrhage in right pararenal space:  - No history of trauma, was on heparin gtt. Received one dose of eliquis 5 mg po  - Hgb dropped significantly, received 4 u PRBC, Hb now is 7.2, last transfusion 12/14  - Heparin and eliquis discontinued  ???  3. ESRD on HD    Recommendations:  -Hgb stable, ok to restart Heparin drip without bolus with goal PTT 60-80 for 24 hours, after which is stable to administer bolus as needed. Plan to transition back to Eliquis 2.5 mg po BID after 48hrs stability. ???  -will need IVC filter removed once stable on anticoagulation??? 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  ???  Pt discussed with Dr.Vallurupalli.     ________________________________________________________________________    Subjective  Debra Lam is a 21 y.o. female.  Patient is feeling well, has no new complaints. Denies RLE pain or swelling, no CP or SOB. Denies signs of bleeding.      Medications  Scheduled Meds:  anticoagulant sodium citrate solution 1-5 mL 1-5 mL Intra-catheter Once in IP Dialysis   calcium acetate (PHOSLO) capsule 1,334 mg 1,334 mg Oral TID w/ meals   ceFAZolin (ANCEF) 1.25 g in dextrose 5% (D5W) 50 mL IVPB 1.25 g Intravenous Q12H*   cetirizine (ZYRTEC) tablet 5 mg 5 mg Oral QAM8   cyclobenzaprine (FLEXERIL) tablet 5 mg 5 mg Oral TID   lidocaine (LIDODERM) 5 % topical patch 1 patch 1 patch Topical QDAY   lidocaine (LIDODERM) 5 % topical patch 1 patch 1 patch Topical QDAY   pantoprazole DR (PROTONIX) tablet 40 mg 40 mg Oral BID(11-21)   senna/docusate (SENOKOT-S) solution 10 mL 10 mL Oral BID   sevelamer carbonate (RENVELA) tablet 800 mg 800 mg Oral TID w/ meals   vitamins, multi B, C, Zn & folate (Renal) (NEPHPLEX RX) tablet 1 tablet 1 tablet Oral QDAY   Continuous Infusions:  PRN  and Respiratory Meds:emu PRN, fentaNYL citrate PF Q6H PRN, HYDROcodone/acetaminophen Q4H PRN, ondansetron (ZOFRAN) IV Q6H PRN, sodium chloride 0.9% (NS) IP Dialysis PRN, sodium chloride 0.9% (NS) IP Dialysis PRN, sodium chloride 0.9% (NS) IP Dialysis PRN, tiZANidine Q8H PRN      Review of Systems:  A 14 point review of systems was negative except for: as per subjective    Objective:                          Vital Signs: Last Filed                 Vital Signs: 24 Hour Range   BP: 144/99 (12/17 1330)  Temp: 37.1 ???C (98.7 ???F) (12/17 1330)  Pulse: 106 (12/17 1330)  Respirations: 15 PER MINUTE (12/17 1330)  SpO2: 96 % (12/17 1330)  O2 Delivery: None (Room Air) (12/17 1330) SpO2 Pulse: 105 (12/17 1330) BP: (131-154)/(87-99)   Temp:  [36.5 ???C (97.7 ???F)-37.1 ???C (98.7 ???F)]   Pulse:  [99-106]   Respirations:  [15 PER MINUTE-18 PER MINUTE]   SpO2:  [95 %-98 %]   O2 Delivery: None (Room Air)   Intensity Pain Scale (Self Report): (P) 3 (01/24/17 1330) Vitals:    01/21/17 1500 01/21/17 1915 01/24/17 4540   Weight: 118.4 kg (261 lb 0.4 oz) 117.5 kg (259 lb 0.7 oz) 115.3 kg (254 lb 3.1 oz)       Intake/Output Summary:  (Last 24 hours)    Intake/Output Summary (Last 24 hours) at 01/24/2017 1400  Last data filed at 01/24/2017 0258  Gross per 24 hour   Intake 820 ml   Output 0 ml   Net 820 ml      Stool Occurrence: 0    Physical Exam  General: A&O, no apparent distress  Head: Normocephalic, without obvious abnormality, atraumatic   Eyes: PERRL, anicteric sclera, conjunctiva and lids normal  Nose: Nares normal. Septum midline. Mucosa normal. No drainageor sinus tenderness   Neck: Supple, no rigidity  Oropharynx: No erythema, exudates, lesions or petechiae  Respiratory: no tachypnea  Cardiovascular: no tachycardia  Abdomen: BS normal all quadrants, soft, non-tender, non-distended  Neurological: No focal deficits. Normal strength and sensation throughout  Extremities: No peripheral edema, no cyanosis or clubbing  Pulses: 3+ and symmetric, all extremities   Skin: Color, texturs and turgor normal. No rashes or lesions  Psychosocial: Appropriate      Lab Review  24-hour labs:    Results for orders placed or performed during the hospital encounter of 01/12/17 (from the past 24 hour(s))   CBC AND DIFF    Collection Time: 01/23/17  4:30 PM   Result Value Ref Range    White Blood Cells 10.4 4.5 - 11.0 K/UL    RBC 2.49 (L) 4.0 - 5.0 M/UL    Hemoglobin 7.6 (L) 12.0 - 15.0 GM/DL    Hematocrit 98.1 (L) 36 - 45 %    MCV 91.4 80 - 100 FL    MCH 30.6 26 - 34 PG    MCHC 33.5 32.0 - 36.0 G/DL    RDW 19.1 (H) 11 - 15 %    Platelet Count 252 150 - 400 K/UL    MPV 6.6 (L) 7 - 11 FL    Neutrophils 81 (H) 41 - 77 % Lymphocytes 10 (L) 24 - 44 %    Monocytes 7 4 - 12 %    Eosinophils 2 0 - 5 %  Basophils 0 0 - 2 %    Absolute Neutrophil Count 8.40 (H) 1.8 - 7.0 K/UL    Absolute Lymph Count 1.10 1.0 - 4.8 K/UL    Absolute Monocyte Count 0.70 0 - 0.80 K/UL    Absolute Eosinophil Count 0.20 0 - 0.45 K/UL    Absolute Basophil Count 0.00 0 - 0.20 K/UL   PROTIME INR (PT)    Collection Time: 01/24/17  4:01 AM   Result Value Ref Range    INR 2.0 (H) 0.8 - 1.2   PTT (APTT)    Collection Time: 01/24/17  4:01 AM   Result Value Ref Range    APTT 35.2 24.0 - 36.5 SEC   COMPREHENSIVE METABOLIC PANEL    Collection Time: 01/24/17  4:01 AM   Result Value Ref Range    Sodium 133 (L) 137 - 147 MMOL/L    Potassium 3.6 3.5 - 5.1 MMOL/L    Chloride 96 (L) 98 - 110 MMOL/L    Glucose 93 70 - 100 MG/DL    Blood Urea Nitrogen 29 (H) 7 - 25 MG/DL    Creatinine 16.10 (H) 0.4 - 1.00 MG/DL    Calcium 7.1 (L) 8.5 - 10.6 MG/DL    Total Protein 6.7 6.0 - 8.0 G/DL    Total Bilirubin 0.2 (L) 0.3 - 1.2 MG/DL    Albumin 2.9 (L) 3.5 - 5.0 G/DL    Alk Phosphatase 80 25 - 110 U/L    AST (SGOT) 14 7 - 40 U/L    CO2 26 21 - 30 MMOL/L    ALT (SGPT) <3 (L) 7 - 56 U/L    Anion Gap 11 3 - 12    eGFR Non African American 4 (L) >60 mL/min    eGFR African American 5 (L) >60 mL/min   MAGNESIUM    Collection Time: 01/24/17  4:01 AM   Result Value Ref Range    Magnesium 2.5 1.6 - 2.6 mg/dL   PHOSPHORUS    Collection Time: 01/24/17  4:01 AM   Result Value Ref Range    Phosphorus 6.5 (H) 2.0 - 4.5 MG/DL   CBC AND DIFF    Collection Time: 01/24/17  4:01 AM   Result Value Ref Range    White Blood Cells 9.9 4.5 - 11.0 K/UL    RBC 2.38 (L) 4.0 - 5.0 M/UL    Hemoglobin 7.2 (L) 12.0 - 15.0 GM/DL    Hematocrit 96.0 (L) 36 - 45 %    MCV 91.6 80 - 100 FL    MCH 30.4 26 - 34 PG    MCHC 33.2 32.0 - 36.0 G/DL    RDW 45.4 (H) 11 - 15 %    Platelet Count 266 150 - 400 K/UL    MPV 6.3 (L) 7 - 11 FL    Neutrophils 79 (H) 41 - 77 %    Lymphocytes 12 (L) 24 - 44 %    Monocytes 7 4 - 12 % Eosinophils 2 0 - 5 %    Basophils 0 0 - 2 %    Absolute Neutrophil Count 7.80 (H) 1.8 - 7.0 K/UL    Absolute Lymph Count 1.20 1.0 - 4.8 K/UL    Absolute Monocyte Count 0.70 0 - 0.80 K/UL    Absolute Eosinophil Count 0.20 0 - 0.45 K/UL    Absolute Basophil Count 0.00 0 - 0.20 K/UL       Point of Care Testing  (Last 24 hours)  Glucose: 93 (  01/24/17 0401)    Radiology and other Diagnostics Review:    Pertinent radiology reviewed.    Obie Dredge, APRN   Pager [phone]

## 2017-01-25 LAB — CBC AND DIFF
Lab: 7.8 g/dL — ABNORMAL LOW (ref >60)
Lab: 9.6 10*3/uL — ABNORMAL HIGH (ref >60)
Lab: 8.6 K/UL — ABNORMAL HIGH (ref 4.5–11.0)
Lab: 9.6 K/UL — ABNORMAL HIGH (ref 4.5–11.0)

## 2017-01-25 LAB — PHOSPHORUS: Lab: 4.7 mg/dL — ABNORMAL HIGH (ref 2.0–4.5)

## 2017-01-25 LAB — PROTIME INR (PT): Lab: 2.1 MMOL/L — ABNORMAL HIGH (ref 0.8–1.2)

## 2017-01-25 LAB — COMPREHENSIVE METABOLIC PANEL
Lab: 137 MMOL/L — ABNORMAL HIGH (ref 137–147)
Lab: 3.7 MMOL/L — ABNORMAL LOW (ref 3.5–5.1)

## 2017-01-25 LAB — PTT (APTT): Lab: 33 s — ABNORMAL LOW (ref >60)

## 2017-01-25 MED ORDER — CARVEDILOL 6.25 MG PO TAB
6.25 mg | Freq: Two times a day (BID) | ORAL | 0 refills | Status: DC
Start: 2017-01-25 — End: 2017-01-29
  Administered 2017-01-26 – 2017-01-28 (×5): 6.25 mg via ORAL

## 2017-01-25 MED ORDER — HEPARIN (PORCINE) IN 5 % DEX 20,000 UNIT/500 ML (40 UNIT/ML) IV SOLP
0-2000 [IU]/h | INTRAVENOUS | 0 refills | Status: DC
Start: 2017-01-25 — End: 2017-01-28
  Administered 2017-01-26: 01:00:00 800 [IU]/h via INTRAVENOUS
  Administered 2017-01-26: 23:00:00 1200 [IU]/h via INTRAVENOUS
  Administered 2017-01-27 – 2017-01-28 (×2): 1300 [IU]/h via INTRAVENOUS

## 2017-01-25 NOTE — Progress Notes
Renal Progress Note    Name:  Debra Lam   UYQIH'K Date:  01/25/2017  Admission Date: 01/12/2017  LOS: 13 days          Assessment and Plan   Principal Problem:    CLABSI (central line-associated bloodstream infection)  Active Problems:    ESRD (end stage renal disease) on dialysis (HCC)    Morbid obesity (HCC)    HTN (hypertension)    OSA (obstructive sleep apnea)    Anemia    Acute deep vein thrombosis (DVT) of femoral vein of right lower extremity (HCC)        Debra Lam is a 21 y.o. 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  Sepsis  - febrile, leukocytosis, and tachycardic on admission               - has port, RLE tunneled HD (pulled), and PD catheter  Group B Strep bacteremia and S. aureus growing from [facility] tip, which was d/c 12/6  ???  Retroperitoneal bleed  - CT abd 12/12 with retroperitoneal bleed --> STAT IR: Right renal artery: Widely patent right renal artery. Multiple small areas of active contrast extravasation involving superior pole, interpolar, and lower pole renal artery branches s/p embolization  - CT abd 12/15  redistribution of the hematoma but no expansions.  A small amount of fluid was seen which was considered a dilute hemoperitoneum.  ???  DVT  - Partially occlusive acute appearing thrombus within the right external iliac and right common femoral veins  ???  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 SOB: +ve  parainfluenza - resolved   ???  HTN  OSA  Hx of PE  - 2016, likely provoked???while on OCP  - Was on eliquis, not currently on anticoagulation  ???  Iron deficiency anemia  Tsat 10%, ferritin 610 likely due to acute infection  ???  Hemoperitoneum  ???  Recommendations:  - Given unclear etiology for spontaneous renal artery branches (on anticoagulation) --> would re-image with CT abd/pelvis in future once hemorrhage has resorbed to exclude underlying abnormalities, such as renal cell carcinoma    - next plan for HD on Wednesday (plan for M/W/F)   - ID managing abx    pt discussed with Dr. Holley Raring, MD  Nephrology fellow  Pager [phone]      Subjective     Debra Lam is a 21 y.o. female     Pt sleeping today; able to arouse easily. no new acute complaints.         Medications     Medications    MEDS    amLODIPine 10 mg Oral QDAY   calcium acetate 1,334 mg Oral TID w/ meals   ceFAZolin  (ANCEF)  IV 2 g Intravenous QDAY(17)   cetirizine 5 mg Oral QAM8   cyclobenzaprine 5 mg Oral TID   lidocaine 1 patch Topical QDAY   lidocaine 1  patch Topical QDAY   pantoprazole DR 40 mg Oral BID(11-21)   senna/docusate 10 mL Oral BID   sevelamer carbonate 800 mg Oral TID w/ meals   vitamins, multi B, C, Zn & folate (Renal) 1 tablet Oral QDAY    IV MEDS  Prn emu PRN, fentaNYL citrate PF Q6H PRN 25 mcg at 01/25/17 1258, HYDROcodone/acetaminophen Q4H PRN 2 tablet at 01/25/17 0935, ondansetron (ZOFRAN) IV Q6H PRN 4 mg at 01/25/17 1257, tiZANidine Q8H PRN 4 mg at 01/19/17 0527           Physical Exam     Vital Signs: Last Filed In 24 Hours Vital Signs: 24 Hour Range   BP: 158/109 (12/18 1408)  Temp: 37.2 ???C (98.9 ???F) (12/18 1408)  Pulse: 118 (12/18 1408)  Respirations: 18 PER MINUTE (12/18 1408)  SpO2: 94 % (12/18 1408)  O2 Delivery: None (Room Air) (12/18 1408)  SpO2 Pulse: 110 (12/17 1730) BP: (143-172)/(92-116)   Temp:  [36.4 ???C (97.6 ???F)-37.6 ???C (99.6 ???F)]   Pulse:  [98-118] Respirations:  [16 PER MINUTE-20 PER MINUTE]   SpO2:  [94 %-100 %]   O2 Delivery: None (Room Air)   Intensity Pain Scale (Self Report): 8 (01/25/17 1258)        Intake/Output Summary (Last 24 hours) at 01/25/2017 1708  Last data filed at 01/25/2017 0532  Gross per 24 hour   Intake 890 ml   Output 3307 ml   Net -2417 ml      Vitals:    01/24/17 0613 01/24/17 1330 01/25/17 0609   Weight: 115.3 kg (254 lb 3.1 oz) 120 kg (264 lb 8.8 oz) 113.6 kg (250 lb 7.1 oz)       Gen: Alert and Oriented, No Acute Distress   HEENT: Sclera normal; MMM, neck keloid  CV: no JVD, S1 and S2 normal, no rubs, murmurs or gallops   Pulm: Clear to Auscultation bilateral   GI: BS+ x4, non-tender to palpation  Neuro: Grossly normal, moving all extremities, speech intact  Ext: no edema, clubbing or cyanosis   Skin: no rash   + femoral HD CVC      Labs:      Recent Labs      01/23/17   0322  01/24/17   0401  01/25/17   0407   NA  134*  133*  137   K  3.6  3.6  3.7   CL  97*  96*  98   CO2  27  26  30    GAP  10  11  9    BUN  23  29*  16   CR  8.95*  11.43*  7.46*   GLU  95  93  91   CA  7.1*  7.1*  7.9*   ALBUMIN  2.9*  2.9*  2.9*   MG  2.3  2.5   --    PO4  5.6*  6.5*  4.7*       Recent Labs      01/22/17   2030  01/22/17   2343  01/23/17   0322  01/23/17   0905  01/23/17   1630  01/24/17   0401  01/24/17   1800  01/25/17   0407   WBC  11.9*  13.0*  12.1*  11.2*  10.4  9.9  9.6  8.6   HGB  7.2*  7.6*  7.4*  7.7*  7.6*  7.2*  7.8*  8.0*   HCT  21.1*  23.0*  22.5*  23.1*  22.7*  21.8*  22.9*  23.6*   PLTCT  182  223  224  235  252  266  307  270   INR   --    --   3.0*   --    --   2.0*   --   2.1*   PTT   --    --   37.7*   --    --   35.2   --   33.6   AST   --    --   15   --    --   14   --   11   ALT   --    --   <3*   --    --   <3*   --   <3*   ALKPHOS   --    --   71   --    --   80   --   80      Estimated Creatinine Clearance: 14.2 mL/min (A) (based on SCr of 7.46 mg/dL (H)).  Vitals:    01/24/17 8413 01/24/17 1330 01/25/17 0609 Weight: 115.3 kg (254 lb 3.1 oz) 120 kg (264 lb 8.8 oz) 113.6 kg (250 lb 7.1 oz)    No results for input(s): PHART, PO2ART in the last 72 hours.    Invalid input(s): PC02A

## 2017-01-25 NOTE — Progress Notes
Infectious Disease Progress Note    Name:  Debra Lam   OZHYQ'M Date:  01/24/2017  Admission Date: 01/12/2017    Consulted for suspected tunneled line infection. please assist with abx choices and ability to salvage line  ???  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  Discussed with micro lab, positive blood cultures were reported to [facility] this morning at 3:30.  Blood cultures were reportedly drawn in the peripheral L arm at 17:45.  Their blood cultures are processed at [facility] in [place].  Discussed with their micro lab, culture positive with a strep species in the anaerobic bottle only.  - 01/12/17 CT RLE: There is mild/moderate stranding within the subcutaneous tissues along the catheter tract, greatest near the insertion site within the right femoral vein. The   soft tissue fat stranding about the catheter extends cephalad to approximately the level of the lower right external iliac vein. There is no loculated fluid collection identified within the right thigh to suggest abscess. No subcutaneous gas  - 01/14/17 Right femoral tunneled HD line removed  ???  # Right pararenal bleed  - 01/19/17 - IR:  Arteriogram -small bleeding foci from tiny right renal artery branch; successfully occluded with coils  - Etiology of bleed?  Mycotic aneurysm? - Angiogram did not reveal one solitary bleeding point, but rather a few very tiny bleeding arterials  - It is possible that the low flow through this hypoplastic organ would have made deposition of bacteria within the vascular walls more likely  - 12/13 Received 4 units pRBCs; Hb 6.6 -->9.1    # URI with parainfluenza  - 01/13/17 Respiratory PCR panel: + parainfluenza 4   ???  # Anemia / hx of PUD  - EGD (12/10): revealed a normal esophagus and stomach but some nonbleeding duodenal erosions.  - GI recommendations: BID PPI x 2 mo; check H.pylori stool ag  ???  # HTN  ???  # H/o PE; suspect provoked while on OCP / Right external iliac and right common femoral vein thrombosis, partially occlusive, acute  - Hematology consulted: recommended Given prior tolerance to eliquis and recognized use in patients with ESRD would recommend eliquis given warfarin allergy.Recommend 3 months minimum and then risk/benefit discussion with her PCP in [place] about longer term continuation given her young age and recurrent VTE.  ???  # H/o peritonitis  - Pt stated she was recently treated at [facility] in [place] for an infection related to her PD catheter but was unable to provide any further information    # OSA, non-compliant with CPAP      Recommendations:     Favor lengthening course of planned antibiotic therapy with cefazolin to cover the possibility of endovascular infection.  Recommended antibiotic stop date =  03/14/17.    _____________________________________________________________________________  Interval History    Seen and examined. She was sleeping I did not wake her up but talked with her mom in the room.  She had dialysis today and is back to the room tired.  Transitioned out of the ICU yesterday.  She still reports abdominal pain, right flank pain.   Appetite better as she ate today and nausea controlled with Zofran.     WBC 9.6, Hb 7.8, plts 307 (PMN 83%)   Cr 11.43    Antimicrobial Start date End date   Zosyn 12/6 12/10 Linezolid 12/6 12/10   Cefazolin 12/10 Active   ??? ??? ???   ??? ??? ???   Estimated Creatinine Clearance: 9.6 mL/min (A) (based on SCr of 11.43 mg/dL (H)).    Key Results  01/19/17 PD cath cx: NGTD  01/17/17 Blood cx from L AC: NGTD  01/17/17 Blood cx from R chest port: NGTD  01/14/17 Blood cx from R chest port: NG  01/14/17 Catheter tip cx: MSSA  01/14/17 Blood cx from dialysis catheter: NG  01/13/17 Blood cx from port: NG  01/13/17 Blood cx from L AC: NG  01/13/17 Right groin puncture swab cx: Strep agalactiae (light growth), Corynebacterium spp. (light growth), CoNS (light growth)  01/13/17 Blood cx from R chest port: Streptococcus agalactiae in 1/2 bottles  01/13/17 Blood cx from R femoral HD cath: Streptococcus agalactiae in 1/2 bottles  01/13/17 Respiratory PCR panel: + parainfluenza 4     Medications  Scheduled Meds:    calcium acetate (PHOSLO) capsule 1,334 mg 1,334 mg Oral TID w/ meals   ceFAZolin (ANCEF) IVP 2 g 2 g Intravenous QDAY(17)   cetirizine (ZYRTEC) tablet 5 mg 5 mg Oral QAM8   cyclobenzaprine (FLEXERIL) tablet 5 mg 5 mg Oral TID   lidocaine (LIDODERM) 5 % topical patch 1 patch 1 patch Topical QDAY   lidocaine (LIDODERM) 5 % topical patch 1 patch 1 patch Topical QDAY   pantoprazole DR (PROTONIX) tablet 40 mg 40 mg Oral BID(11-21)   senna/docusate (SENOKOT-S) solution 10 mL 10 mL Oral BID   sevelamer carbonate (RENVELA) tablet 800 mg 800 mg Oral TID w/ meals   vitamins, multi B, C, Zn & folate (Renal) (NEPHPLEX RX) tablet 1 tablet 1 tablet Oral QDAY   Continuous Infusions:    PRN and Respiratory Meds:emu PRN, fentaNYL citrate PF Q6H PRN, HYDROcodone/acetaminophen Q4H PRN, ondansetron (ZOFRAN) IV Q6H PRN, tiZANidine Q8H PRN      Physical Examination                          Vital Signs: Last                  Vital Signs: 24 Hour Range   BP: 153/92 (12/17 1846)  Temp: 37.2 ???C (99 ???F) (12/17 1745)  Pulse: 107 (12/17 1745)  Respirations: 18 PER MINUTE (12/17 1745)  SpO2: 100 % (12/17 1745) O2 Delivery: None (Room Air) (12/17 1745)  SpO2 Pulse: 110 (12/17 1730) BP: (131-181)/(87-130)   Temp:  [36.5 ???C (97.7 ???F)-37.2 ???C (99 ???F)]   Pulse:  [101-121]   Respirations:  [15 PER MINUTE-26 PER MINUTE]   SpO2:  [95 %-100 %]   O2 Delivery: None (Room Air)     General:sleeping  H&N: unremarkable  Lungs: clear  Heart: Regular   Abdomen:  non distended  Ext:  No edema  Skin: no rash  Msk: no joint swelling or redness  Lines/drains/tubes:  Port in R chest; PD catheter; PIV x1; tunneled HD line in L femoral      Laboratory   Hematology  Recent Labs      01/22/17   0340   01/23/17   0322   01/23/17   1630  01/24/17   0401  01/24/17   1800   WBC  13.6*   < >  12.1*   < >  10.4  9.9  9.6   HGB  8.1*   < >  7.4*   < >  7.6*  7.2*  7.8*   HCT  24.3*   < >  22.5*   < >  22.7*  21.8*  22.9*   PLTCT  209   < >  224   < >  252  266  307   PTT  34.2   --   37.7*   --    --   35.2   --    INR  2.7*   --   3.0*   --    --   2.0*   --     < > = values in this interval not displayed.     Chemistry  Recent Labs      01/21/17   2010  01/22/17   0340  01/23/17   0322  01/24/17   0401   NA   --   136*  134*  133*   K   --   3.8  3.6  3.6   CL   --   99  97*  96*   CO2   --   29  27  26    BUN   --   13  23  29*   CR   --   6.42*  8.95*  11.43*   GFR   --   8*  5*  4*   GLU   --   106*  95  93   CA   --   7.3*  7.1*  7.1*   PO4  3.2   --   5.6*  6.5*   ALBUMIN   --   3.1*  2.9*  2.9*   ALKPHOS   --   69  71  80   AST   --   17  15  14    ALT   --   <3*  <3*  <3*   TOTBILI   --   0.3  0.2*  0.2*       Microbiology, Radiology and other Diagnostics Review     Microbiology reviewed.  Pertinent radiology reviewed.    Velton Roselle Colon Flattery, MD  Infectious Diseases   Pager [phone]

## 2017-01-25 NOTE — Case Management (ED)
Case Management Progress Note    NAME:Debra Lam                          MRN: [id number]              DOB:25-May-1995          AGE: 21 y.o.  ADMISSION DATE: 01/12/2017             DAYS ADMITTED: LOS: 13 days      Today???s Date: 01/25/2017    Plan  Anticipate DC home with family support, [facility], [facility], and outpatient HD at [facility].    Interventions  ? Support   Support: Pt/Family Updates re:POC or DC Plan, Patient Education   NCM met with patient, patient's mother [name] along with Med-E attending physician.  NCM discuss plan for DC with IV abx and out patient HD. NCM discuss plan for patient to stay in [place] until IV abx completed for IV abx, ID follow up and continued care. Pt's mother verbalize understanding and states they were planning to stay until after New Years, and would not be a problem to stay until February when IV abx completed. Anticipated completion of IV abx 03/15/216 per Dr. Anselm Jungling. Pt's mother verbalize understanding.  NCM discuss plan for outpatient HD. Pt's mother verbalize understanding of ongoing outpatient HD at [facility]   Pt's mother states the patient has [place] Medicaid and has utilized transportation services for dialysis. Pt's mother states that it was cancelled inadvertently, but they called [place] Medicaid and was told it would be reactivated.   NCM contact financial counselors who confirm patient [place].  NCM and Med-E team discuss goal for DC before Christmas.    ? Info or Referral      ? Discharge Planning   Discharge Planning: Home Health, [facility], OP HD or PD   NCM update [facility] regarding [place] Medicaid. Reassess patient benefits and copay.   NCM contact [facility] liaison regarding plan for resumption of dialysis. Pt previously was on PD, and did not have a chair time. NCM update regarding plan for HD and anticipated DC. [facility] liaison will updated regarding chair time 01/26/2017. ? Medication Needs      ? Financial   Financial: Medicare/Medicaid/SSDI Information  ? Legal      ? Other        Disposition  ? Expected Discharge Date    Expected Discharge Date: 01/31/17  ? Transportation   Does the patient need discharge transport arranged?: No  Transportation Name, Phone and Availability #1: Johny Drilling (brother) - [phone] or Asher Muir (sister-in-law) - [phone]  ? Next Level of Care (Acute Psych discharges only)      ? Discharge Disposition                                          Durable Medical Equipment      No service has been selected for the patient.      [place] Destination      No service has been selected for the patient.      [facility] - Selection Complete      Service Provider Request Status Selected Services Address Phone Number Fax Number    [facility] Selected Home Health Services [phone], [place]  [address] [phone]      [facility] - Selection Complete      Service Provider Request Status Selected Services Address Phone Number Fax Number    [facility] Selected Home Infusion and Injection [address], [place] [address] [phone]          Ellamae Sia RN BSN  Integrated Nurse Case Manager  [phone]/ [phone]

## 2017-01-25 NOTE — Progress Notes
Hospitalist progress note    Debra Lam             Admission Date:  01/12/2017  Today's Date: 01/25/17                       Changes for today assessment and plan noted in bold.    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 Transferred to floor 12/5. Developed acute hypotension requiring rapid response and transfer to MICU for pressure support 12/12. Stabilized and transferred to floor 12/17.    ???  Bacteremia due to right femoral HD line, parainfluenza, and vaginal yeast infection with sepsis POA. ID consulted.  -Blood cultures 12/5 positive for streptococcus agalactiae  -Cultures puncture wound 12/5 positive for streptococcus agalactiae, corynebacterium species, and coagulase negative stap  -RVP 12/6 positive for parainfluenza 4  -CT lower extremity 12/6 showed right lower extremity tunnel femoral central venous catheter with subcutaneous stranding along the catheter tract within the upper thigh and groin likely postprocedure in nature; no evidence of abscess or subcutaneous gas or significant fascial fluid to suggest necrotizing fasciitis and peritoneal dialysis catheter in place with soft tissue thickening along the tract within the subcutaneous tissue likely representing scarring but no loculated fluid collection to suggest abscess  -Cath removed 12/6. Tip culture positive for staph auresus  -Blood cultures 12/6, 12/7, 12/10, and 12/14 are negative  -Peritoneal dialysate fluid culture 12/12 and 12/15 negative to date  -Fluconazole given 12/11.  -Completed a course of Zosyn (12/6-12/10) and Linezolid (12/6-12/10). Continue cefazolin (started 12/7).  Stop date 03/14/2017. Retroperitoneal bleed with acute blood loss anemia in setting of chronic anemia.  -Developed severe hypotension 12/12 requiring rapid response and transferred to ICU for pressure support  -CT abdomen and pelvis 12/12 showed large right pararenal space hemorrhage with active contrast extravasation  -Status post coil embolization of right renal artery by IR 12/12  -CTA abdomen for bleeding from peritoneal dialysis catheter 12/16 showed interval evolution and redistribution of a large multiloculated right perinephric/perirenal hematoma but no focal atrial extravastation  -Iron studies 12/8 consistent with chronic disease  -Status post 6 units of blood this admission.  -Continue H&H every 12 hours.  -We will need repeat CT abdomen and pelvis once hematoma has resolved to exclude other etiologies for her spontaneous retroperitoneal bleed    DVT in setting of prior PE contributed to OCP. Hematology consulted.    -Allergy to warfarin  -Bilateral LE [place] 12/8 showed partially occlusive acute appearing thrombus within the right external iliac and right common femoral veins  -Completed course of heparin gtt and transition to Eliquis prior to the development of retroperitoneal bleed.  -Given issues with active bleeding, anticoagulation was stopped on 12/12.    -IVC filter placed by IR 12/13. This will need to be removed once stable on oral anticoagulation.  -Elevated INR discussed with hematology. I am concern about repeat bleeding, but we will start heparin gtt with goal PTT 60-80 without boluses     -If she can tolerate therapeutic heparin for 48 hours, will discharge on Eliquis 2.5 mg BID for outpatient anticoagulation.  -She will need 3 months of anticoagulation  Abdominal pain likely due to retroperitoneal bleed in the setting of PUD due to NSAID use.  -EGD 12/10 showed normal stomach and duodenal erosions without bleeding  -H. pylori fecal antigen was negative  -Continue PPI BID. -Continue ativan and zofran for nausea (QTc prolongation)  -Continue Noroc 1-2 tab every 4 hours prn and fentanyl 25 mcg every 6 hours prn pain.  -Continue lidocaine patch daily, tizanidine 4 mg every 8 hours prn and flexeril 5 mg TID scheduled  -Continue bowel regimen.  -Will need to wean narcotics significantly to leave prior to discharge    Hypotension due to acute blood loss in the setting of HTN. Resolved.  -ECHO 12/7 showed EF of 60-65% and no other significant finding  -Required Levophed briefly at transfer to MICU.  -Continue amlodipine 10 mg daily.  Restart carvedilol 6.25 mg BID.   -Continue to hold Coreg and fosinopril due to hypotension.     OSA.   -Noncompliant with CPAP prior to admission  -Continue CPAP.    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 Renal consulted.    -Outpatient regimen: [facility]  -She was on PD for 6 months prior to admission due to lack of access but was changed to HD prior to admission  -Completed course of PD12/2-12/3.    -Due to retroperitoneal bleed, PD is no longer an option.   -Left femoral HD line placed by IR 12/13.  -First HD completed 12/4. Continue HD MWF.    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 Continue Renvela and PhosLo    Grade 2 pressure ulcers located on buttocks. Wound team consulted.     FEN  -No IVF  -Lytes ok  -Renal diet  ???  DVT ppx: SCD only due to active bleeding    Full code.  Confirm with patient.  Veronica/mother is emergency contact: [phone] 4 hours 5    Disp: Continue inpatient care until hemoglobin is stable on anticoagulation.      Given issues with coordinating care with [facility], the patient is agreeable to stay in [place] until she is completed a course of IV antibiotics.  She will return to her brother's home in [place], [place].  Case management will try to facilitate for dialysis unit closer to their home.    I spent a total of 45 minutes in pt care today with an estimated 20 minutes spent reviewing chart and coordinating care. Reminder of time was spent examining patient, discussing care plan, and answering patient questions in the patient's room.    Complexity of medical decision making is high because of the multi-system nature of disease process.        Irving Copas, MD  Hospitalist  [phone]  __________________________________________________________________________________    Subjective: No overnight events.  Was still sleeping when I visited her room at 1 PM today.  No fevers or chills.  Reports lightheadedness and dizziness when she stands up.  Continues to have abdominal pain but slightly improved since yesterday.  Reports intermittent nausea but eating well without emesis.  Had a bowel movement today.  No other complaints.    Mother in room.  Care plan discussed.    Allergies:  Advair diskus [fluticasone-salmeterol]; Adhesive tape (rosins); and Coumadin [warfarin]    Medications:  Scheduled Meds:    amLODIPine (NORVASC) tablet 10 mg 10 mg Oral QDAY   calcium acetate (PHOSLO) capsule 1,334 mg 1,334 mg  Oral TID w/ meals   ceFAZolin (ANCEF) IVP 2 g 2 g Intravenous QDAY(17)   cetirizine (ZYRTEC) tablet 5 mg 5 mg Oral QAM8   cyclobenzaprine (FLEXERIL) tablet 5 mg 5 mg Oral TID   lidocaine (LIDODERM) 5 % topical patch 1 patch 1 patch Topical QDAY   lidocaine (LIDODERM) 5 % topical patch 1 patch 1 patch Topical QDAY   pantoprazole DR (PROTONIX) tablet 40 mg 40 mg Oral BID(11-21)   senna/docusate (SENOKOT-S) solution 10 mL 10 mL Oral BID   sevelamer carbonate (RENVELA) tablet 800 mg 800 mg Oral TID w/ meals   vitamins, multi B, C, Zn & folate (Renal) (NEPHPLEX RX) tablet 1 tablet 1 tablet Oral QDAY   Continuous Infusions:  PRN and Respiratory Meds:emu PRN, fentaNYL citrate PF Q6H PRN, HYDROcodone/acetaminophen Q4H PRN, ondansetron (ZOFRAN) IV Q6H PRN, tiZANidine Q8H PRN       Physical Exam:  Vital Signs: Last Filed In 24 Hours Vital Signs: 24 Hour Range   BP: 148/96 (12/18 0524)  Temp: 37.3 ???C (99.1 ???F) (12/18 0524)  Pulse: 105 (12/18 0524)  Respirations: 18 PER MINUTE (12/18 0524)  SpO2: 96 % (12/18 0524)  O2 Delivery: None (Room Air) (12/18 0524)  SpO2 Pulse: 110 (12/17 1730) BP: (141-181)/(91-130)   Temp:  [36.4 ???C (97.6 ???F)-37.6 ???C (99.6 ???F)]   Pulse:  [98-121]   Respirations:  [15 PER MINUTE-26 PER MINUTE]   SpO2:  [95 %-100 %]   O2 Delivery: None (Room Air)   Intensity Pain Scale (Self Report): 8 (01/25/17 0532)    Intake/Outpur Summary: (Last 24 hours)    Intake/Output Summary (Last 24 hours) at 01/25/2017 0857  Last data filed at 01/25/2017 0532  Gross per 24 hour   Intake 1210 ml   Output 3307 ml   Net -2097 ml     Physical Exam:    General: alert and oriented to person, place, and time.  No acute distress.  Heart: regular rhythm.  Normal rate.  Normal S1 and S2.  No mumrurs, rubs or gallops.  Lungs: clear to ascultation.  No wheezes, rales or coarseness.  Abdomen: Normal bowel sounds.  Soft and not distended.  Tender to palpation over her left lower quadrant.  PD catheter noted in her left lower quadrant.  Extremities: Left femoral HD catheter in place.  2+ pitting edema.  But no clubbing or cyanosis.  Distal pulses intact.     Laboratory:  Recent Labs      01/23/17   0322  01/24/17   0401  01/25/17   0407   NA  134*  133*  137   K  3.6  3.6  3.7   CL  97*  96*  98   CO2  27  26  30    BUN  23  29*  16   CR  8.95*  11.43*  7.46*   GLU  95  93  91   GAP  10  11  9    GFR  5*  4*  7*   MG  2.3  2.5   --    CA  7.1*  7.1*  7.9*   PO4  5.6*  6.5*  4.7*     Recent Labs      01/23/17   0322  01/24/17   0401  01/25/17   0407   ALKPHOS  71  80  80   AST  15  14  11    ALT  <3*  <3*  <  3*   TOTPROT  6.6  6.7  6.8   TOTBILI  0.2*  0.2*  0.2*   ALBUMIN  2.9*  2.9*  2.9*     Recent Labs      01/23/17   0322   01/23/17   1630  01/24/17   0401  01/24/17   1800  01/25/17 0407   HGB  7.4*   < >  7.6*  7.2*  7.8*  8.0*   HCT  22.5*   < >  22.7*  21.8*  22.9*  23.6*   WBC  12.1*   < >  10.4  9.9  9.6  8.6   PLTCT  224   < >  252  266  307  270   INR  3.0*   --    --   2.0*   --   2.1*    < > = values in this interval not displayed.     No results for input(s): PHART, PCO2A, PO2ART, HCO3A, O2SATACAL in the last 72 hours.

## 2017-01-25 NOTE — Progress Notes
General Progress Note    Name:  Debra Lam   Today's Date:  01/25/2017  Admission Date: 01/12/2017  LOS: 13 days                     Assessment/Plan:    Principal Problem:    CLABSI (central line-associated bloodstream infection)  Active Problems:    ESRD (end stage renal disease) on dialysis (HCC)    Morbid obesity (HCC)    HTN (hypertension)    OSA (obstructive sleep apnea)    Anemia    Acute deep vein thrombosis (DVT) of femoral vein of right lower extremity [facility])      Debra Lam is a 21 year old African-American female, originally from [place],???with past medical history of hypertension, peptic ulcer disease, pulmonary embolism, congenital hypoplastic kidney, 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   ???  1. Right external iliac and right common femoral vein thrombosis, acute:  - Appears provoced due to presence of the HD catheter; now removed  - Had PE in 2017, was treated with coumadin but developed rash/hives, switched to eliquis which she used for 2-3 months (unsure of duration)  -???Was started on heparin gtt with a plan to transition to eliquis for long term use  - Had spontaneous bleeding in the retroperitoneal space, so AC discontinued 12/12 and IVC filter placed by IR 12/13  - Currently not on any anticoagulation but INR is 2.1 today (2.1 <- 2.0 <- 3.0)  ???  2. Right sided retroperitoneal hemorrhage in right pararenal space:  - No history of trauma, was on heparin gtt. Received one dose of eliquis 5 mg po  - Hgb dropped significantly, received 4 u PRBC, Hb now is 7.2, last transfusion 12/14  - Heparin and eliquis discontinued. Hb today is 7.6  ???  3. ESRD on HD  - Had dialysis yesterday, next is on Wednesday (MWF)        RECOMMENDATIONS:  - Hgb stable, her INR was high but he never was on VKA, could be due to her poor oral intake for last few days? - INR is trending down, will continue to monitor  - Can restart heparin gtt (NO BOLUS, please) with aPTT goal between 60-90.  - If the Hb remains stable and patient does not have any bleeding, will consider to start on eliquis 48-72 hours after starting heparin gtt.  - Will need to remove IVC filter once stable on anticoagulation.???  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    Patient seen and plan of care was discussed with Dr.Vallurupalli.     Everlean Alstrom, MD  Fellow, Hem/Onc  [phone]    ________________________________________________________________________    Subjective  Debra Lam is a 21 y.o. female.  Patient was sleeping comfortably. Her mother, Debra Lam is at the bedside. No acute overnight event. Patient reports that pain is tolerable if she is in bed but it hurts when she uses toilet, specially in the lower abdomen and right side of the flank. No other new complaints, no overt bleeding, denies dizziness.      Medications  Scheduled Meds:  amLODIPine (NORVASC) tablet 10 mg 10 mg Oral QDAY   calcium acetate (PHOSLO) capsule 1,334 mg 1,334 mg Oral TID w/ meals   ceFAZolin (ANCEF) IVP 2 g 2 g  Intravenous QDAY(17)   cetirizine (ZYRTEC) tablet 5 mg 5 mg Oral QAM8   cyclobenzaprine (FLEXERIL) tablet 5 mg 5 mg Oral TID   lidocaine (LIDODERM) 5 % topical patch 1 patch 1 patch Topical QDAY   lidocaine (LIDODERM) 5 % topical patch 1 patch 1 patch Topical QDAY   pantoprazole DR (PROTONIX) tablet 40 mg 40 mg Oral BID(11-21)   senna/docusate (SENOKOT-S) solution 10 mL 10 mL Oral BID   sevelamer carbonate (RENVELA) tablet 800 mg 800 mg Oral TID w/ meals   vitamins, multi B, C, Zn & folate (Renal) (NEPHPLEX RX) tablet 1 tablet 1 tablet Oral QDAY   Continuous Infusions:  PRN and Respiratory Meds:emu PRN, fentaNYL citrate PF Q6H PRN, HYDROcodone/acetaminophen Q4H PRN, ondansetron (ZOFRAN) IV Q6H PRN, tiZANidine Q8H PRN      Review of Systems:  All other systems reviewed and are negative.    Objective:                          Vital Signs: Last Filed                 Vital Signs: 24 Hour Range   BP: 148/96 (12/18 0524)  Temp: 37.3 ???C (99.1 ???F) (12/18 0524)  Pulse: 105 (12/18 0524)  Respirations: 18 PER MINUTE (12/18 0524)  SpO2: 96 % (12/18 0524)  O2 Delivery: None (Room Air) (12/18 0524)  SpO2 Pulse: 110 (12/17 1730) BP: (141-181)/(91-130)   Temp:  [36.4 ???C (97.6 ???F)-37.6 ???C (99.6 ???F)]   Pulse:  [98-121]   Respirations:  [15 PER MINUTE-26 PER MINUTE]   SpO2:  [95 %-100 %]   O2 Delivery: None (Room Air)   Intensity Pain Scale (Self Report): 8 (01/25/17 0532) Vitals:    01/24/17 0613 01/24/17 1330 01/25/17 0609   Weight: 115.3 kg (254 lb 3.1 oz) 120 kg (264 lb 8.8 oz) 113.6 kg (250 lb 7.1 oz)       Intake/Output Summary:  (Last 24 hours)    Intake/Output Summary (Last 24 hours) at 01/25/2017 0856  Last data filed at 01/25/2017 0532  Gross per 24 hour   Intake 1210 ml   Output 3307 ml   Net -2097 ml      Stool Occurrence: 0    Physical Exam  General appearance:  alert, fatigued, well-developed, cooperative and mild distress  Neck:  supple, symmetrical, trachea midline and no adenopathy  Back:  negative  Lungs:  clear to auscultation bilaterally  Heart:  regular rate and rhythm, S1, S2 normal, no murmur, click, rub or gallop  Abdomen:  soft, non-tender. Bowel sounds normal. No masses,  no organomegaly  Extremities:  extremities normal, atraumatic, no cyanosis or edema  Neurologic:  Grossly normal  Peripheral pulses:  2+ and symmetric    Lab Review  Hematology:    Lab Results   Component Value Date    HGB 8.0 01/25/2017    HCT 23.6 01/25/2017    PLTCT 270 01/25/2017    WBC 8.6 01/25/2017    NEUT 77 01/25/2017    ANC 6.60 01/25/2017    LYMPH 10 01/22/2017    ALC 1.10 01/25/2017    MONA 8 01/25/2017    AMC 0.70 01/25/2017    ABC 0.00 01/25/2017    MCV 92.2 01/25/2017    MCHC 33.9 01/25/2017    MPV 6.4 01/25/2017 RDW 16.0 01/25/2017   , Coagulation:    Lab Results   Component Value Date    PTT  33.6 01/25/2017    INR 2.1 01/25/2017   , General Chemistry:    Lab Results   Component Value Date    NA 137 01/25/2017    K 3.7 01/25/2017    CL 98 01/25/2017    GAP 9 01/25/2017    BUN 16 01/25/2017    CR 7.46 01/25/2017    GLU 91 01/25/2017    CA 7.9 01/25/2017    ALBUMIN 2.9 01/25/2017    LACTIC 0.8 01/22/2017    OBSCA 0.78 01/21/2017    MG 2.5 01/24/2017    TOTBILI 0.2 01/25/2017   , Enzymes:    Lab Results   Component Value Date    AST 11 01/25/2017    ALT <3 01/25/2017    ALKPHOS 80 01/25/2017   , Cardiac markers:    Lab Results   Component Value Date    TNI 0.01 01/20/2017   , Mg and PO4:   Lab Results   Component Value Date    MG 2.5 01/24/2017   , Arterial BG : No results found for: PHART, PCO2A, PO2ART, HCO3A, BASEDEFA, O2SATACAL  , Endocrine:   Lab Results   Component Value Date    TSH 1.050 01/16/2017   , HgbA1C:   Lab Results   Component Value Date    HGBA1C 5.1 12/06/2016   , Lipid Profile: No results found for: CHOL, TRIG, HDL, LDL, VLDL, Sed Rate:   Lab Results   Component Value Date    ESR 44 01/13/2017    and CRP:   Lab Results   Component Value Date    CRP 22.92 01/13/2017

## 2017-01-25 NOTE — Progress Notes
Text page    [phone]; Joni ReiningNicole  ASAP; [phone]; E.D. MPE  b/p 158/109, pulse 118

## 2017-01-25 NOTE — Progress Notes
CLINICAL NUTRITION                                                        Clinical Nutrition Follow-Up Summary     NAME:Debra Lam             MRN: [id number]             DOB:21-Nov-1995          AGE: 21 y.o.  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 On 12/12 patient developed increasing abdominal pain and was seen on CT abdomen with right pararenal space hematoma with active bleed and hypotension requiring rapid response, IR intervention, and transfer to MICU for further care. IVC filter placement and tunneled HD catheter placement 12/13, and s/p HD 12/14, 12/17. Noted very poor PO intake 12/11-12/14. Refusing PO intake today and sleeping soundly/lethargic at last RD visit 12/14 but appears to have had good PO intake earlier in admit. Seen by outpatient RD in October and noted good appetite, noncompliance with renal diet. Patient sleeping soundly today, snoring. Spoke with family at bedside, she reports patient consuming ~ 1 meal per day and not liking nutrition supplements. RD offered education to family but not interested as patient has adequate education. RD continued to encourage PO intake, family understood importance. Pt with 2 stage ll pressure injuries, left buttocks PI resolved 12/15, stage 2 still active. Renal MVI and renvela on board. Current labs: Creat 7.46; BUN & K+: WNL, Phos 4.7. BLE pedal non-pitting edema noted, s/nd/tender/pain, +BM 12/17.     Recommendation:  REC cont renal diet; please offer Novasource Renal supplements. If PO intake does not improve, REC to liberalize diet (if medically able) to low-sodium. Please enocurage patient to consume a protein source at each meal.                                    Intervention / Plan:  Assessed adequacy/tolerance of intakes  Offered education, family at bedside denied       Nutrition Diagnosis:  Inadequate protein-energy intake  Etiology: medical status, altered GI function   Signs & Symptoms: pt, RN report, EMR                       Goals:  Patient to consume >50% of meals  Time Frame: Within 48 Hours  Status: Not met;Ongoing                 Konrad Saha, MS, RD, LD    Phone: [phone] - Pager: [phone]*

## 2017-01-26 ENCOUNTER — Encounter: Admit: 2017-01-26 | Discharge: 2017-01-26 | Payer: MEDICARE

## 2017-01-26 LAB — CBC AND DIFF
Lab: 10 10*3/uL — ABNORMAL HIGH (ref 4.5–11.0)
Lab: 0 10*3/uL (ref 0–0.20)
Lab: 2.7 M/UL — ABNORMAL LOW (ref 4.0–5.0)
Lab: 9.6 10*3/uL (ref 4.5–11.0)

## 2017-01-26 LAB — CULTURE-WOUND/TISSUE/FLUID(AEROBIC ONLY)W/SENSITIVITY

## 2017-01-26 LAB — PTT (APTT)
Lab: 168 s — ABNORMAL HIGH (ref 24.0–36.5)
Lab: 38 s — ABNORMAL HIGH (ref 24.0–36.5)
Lab: 46 s — ABNORMAL HIGH (ref >60)
Lab: 48 s — ABNORMAL HIGH (ref 24.0–36.5)
Lab: 66 s — ABNORMAL HIGH (ref 24.0–36.5)

## 2017-01-26 LAB — PROTIME INR (PT): Lab: 2.8 MMOL/L — ABNORMAL HIGH (ref >60)

## 2017-01-26 LAB — HEPATITIS B SURFACE AG: Lab: NEGATIVE

## 2017-01-26 LAB — HEPATITIS B CORE IGM AB: Lab: NEGATIVE

## 2017-01-26 LAB — HEPATITIS B SURFACE AB: Lab: 32 m[IU]/mL

## 2017-01-26 LAB — COMPREHENSIVE METABOLIC PANEL: Lab: 136 MMOL/L — ABNORMAL LOW (ref 137–147)

## 2017-01-26 LAB — PHOSPHORUS: Lab: 5.5 mg/dL — ABNORMAL HIGH (ref >60)

## 2017-01-26 MED ORDER — PROMETHAZINE 25 MG PO TAB
25 mg | ORAL | 0 refills | Status: DC | PRN
Start: 2017-01-26 — End: 2017-01-29
  Administered 2017-01-27: 02:00:00 25 mg via ORAL

## 2017-01-26 MED ORDER — SODIUM CHLORIDE 0.9 % IV SOLP
1000 mL | INTRAVENOUS | 0 refills | Status: DC | PRN
Start: 2017-01-26 — End: 2017-01-27

## 2017-01-26 MED ORDER — PHYTONADIONE (VITAMIN K1) 5 MG PO TAB
5 mg | Freq: Every day | ORAL | 0 refills | Status: AC
Start: 2017-01-26 — End: ?
  Administered 2017-01-27: 16:00:00 5 mg via ORAL

## 2017-01-26 MED ORDER — SODIUM CHLORIDE 0.9 % IV SOLP
300 mL | INTRAVENOUS | 0 refills | Status: CP | PRN
Start: 2017-01-26 — End: ?
  Administered 2017-01-26: 21:00:00 500 mL via INTRAVENOUS

## 2017-01-26 MED ORDER — ANTICOAG SODIUM CITRATE SOLN 5ML
1-5 mL | Freq: Once | 0 refills | Status: CP
Start: 2017-01-26 — End: ?
  Administered 2017-01-26: 22:00:00 4.4 mL

## 2017-01-26 MED ORDER — SODIUM CHLORIDE 0.9 % IV SOLP
250 mL | INTRAVENOUS | 0 refills | Status: DC | PRN
Start: 2017-01-26 — End: 2017-01-27

## 2017-01-26 MED ORDER — DIALYSATE W/1.5% DEX ULTRA 2000ML
2 L | INTRAPERITONEAL | 0 refills | Status: CP
Start: 2017-01-26 — End: ?
  Administered 2017-01-26: 23:00:00 1 L via INTRAPERITONEAL

## 2017-01-26 MED ORDER — LIDOCAINE 5 % TP PTMD
1 | Freq: Every day | TOPICAL | 0 refills | Status: DC
Start: 2017-01-26 — End: 2017-01-29

## 2017-01-26 NOTE — Progress Notes
Text page    [phone]; Joni ReiningNicole  ASAP; [phone]; E.D. MPE  b/p 144/108

## 2017-01-26 NOTE — Consults
Nursing Pain Management Initial Consult         Pt. Name: Debra Lam  Admit Date: 01/12/2017  Room: ZO1096/04         Reason for Consult: Transition to PO regimen; Taper opioids; Ok with short script for discharge    Communication: Discussed patient's plan with Dr. Anselm Jungling.     Suggestions to Consider:   Continue cyclobenzaprine  Continue emu oil topical ointment  Continue fentanyl IV- Patient will likely benefit from discontinuing fentanyl IV tomorrow to transition to an oral pain regimen prior to discharge.   Continue hydrocodone-acetaminophen  Continue lidocaine topical patches   Discontinue tizanidine- Patient will benefit from having only one muscle relaxant available to relieve muscle spasms to prevent increased somnolence.     Patient will benefit from a multimodal pain regimen, which includes non-pharmacological interventions to provide adequate analgesia. Encourage patient to apply a heating pad to her abdomen for warmth and comfort.     The primary team is responsible for all orders.    Anticipated D/C Planning:   Developing discharge plan.    Thank you for allowing our service to participate in the care of this patient. Please page with questions/concerns, team pager [phone].    Skeet Latch, BSN, RN-BC  Clinical Nurse Coordinator    Pain Management  [phone]         Summary:   Ms. Debra Lam is a 21 year old female with congenital hypoplastic kidneys completed by ESRD status post failed kidney transplant due to rejection on dialysis, HTN, OSA, prior PE due to OCP use, PUD due to NSAID use, and morbid obesity with BMI 45 transferred from OSH to floor 12/5 with gram positive bacteremia, UTI, and parainfluenza with sepsis POA. Transferred to floor 12/5. Developed acute hypotension requiring rapid response and transfer to MICU for pressure support 12/12. Stabilized and transferred to floor 12/17.      Patient is alert and oriented, resting in bed, reporting sharp pain in her abdomen, bilateral flanks, and back. She verbalizes she is concerned about discontinuing fentanyl IV today as she continues to experience pain. Discussed with patient that it will be important for her to transition to an oral pain regimen prior to discharge to ensure she has adequate pain control prior to going home. Patient verbalized understanding. Consider continuing fentanyl IV until tomorrow, then discontinue to transition her to an oral pain regimen. Patient will benefit from discontinuing tizanidine as she has cyclobenzaprine available for muscle cramps or spasms.     Current Medication Regimen:     Current Facility-Administered Medications:   ???  amLODIPine (NORVASC) tablet 10 mg, 10 mg, Oral, QDAY, Ho, Lucille Passy, MD, 10 mg at 01/25/17 0935  ???  anticoagulant sodium citrate solution 1-5 mL, 1-5 mL, Intra-catheter, Once in IP Dialysis, Nancy Fetter, MD, Stopped at 01/26/17 0341  ???  calcium acetate (PHOSLO) capsule 1,334 mg, 1,334 mg, Oral, TID w/ meals, Mohamad Alahmad, Mohamad, MD, 1,334 mg at 01/25/17 2105  ???  carvedilol (COREG) tablet 6.25 mg, 6.25 mg, Oral, BID, Cordell, Judee, APRN-NP, 6.25 mg at 01/25/17 2106  ???  ceFAZolin (ANCEF) IVP 2 g, 2 g, Intravenous, QDAY(17), Ho, Lucille Passy, MD, 2 g at 01/25/17 1726  ???  cetirizine (ZYRTEC) tablet 5 mg, 5 mg, Oral, QAM8, Adrian Prince, DO, 5 mg at 01/25/17 0936  ???  cyclobenzaprine (FLEXERIL) tablet 5 mg, 5 mg, Oral, TID, Mohamad Alahmad, Mohamad, MD, 5 mg at 01/26/17 1136  ???  dialysate w/1.5% dex (ULTRA)  infusion 2 L, 2 L, Intraperitoneal, PD x1 Bag, Nancy Fetter, MD  ???  emu oil, , Topical, PRN, Barron Alvine, APRN  ???  fentaNYL citrate PF (SUBLIMAZE) injection 25 mcg, 25 mcg, Intravenous, Q6H PRN, Jandali, Badr, 25 mcg at 01/26/17 1130  ???  heparin (porcine) 20,000 units/D5W 500 mL infusion (std conc)(premade), 0-2,000 Units/hr, Intravenous, TITRATE, Ho, Lucille Passy, MD, Last Rate: 30 mL/hr at 01/26/17 1434, 1,200 Units/hr at 01/26/17 1434 ???  HYDROcodone/acetaminophen (NORCO) 5/325 mg tablet 1-2 tablet, 1-2 tablet, Oral, Q4H PRN, Bluford Main, Badr, 2 tablet at 01/26/17 0700  ???  lidocaine (LIDODERM) 5 % topical patch 1 patch, 1 patch, Topical, QDAY, Irving Copas, MD  ???  lidocaine (LIDODERM) 5 % topical patch 1 patch, 1 patch, Topical, QDAY, Khaja, Taquiuddin, MD, Stopped at 01/24/17 2244  ???  lidocaine (LIDODERM) 5 % topical patch 1 patch, 1 patch, Topical, QDAY, Barron Alvine, APRN, Stopped at 01/24/17 2244  ???  ondansetron (ZOFRAN) injection 4 mg, 4 mg, Intravenous, Q6H PRN, Barron Alvine, APRN, 4 mg at 01/26/17 1128  ???  pantoprazole DR (PROTONIX) tablet 40 mg, 40 mg, Oral, ZOX(09-60), Felecia Jan, Mohamad, MD, 40 mg at 01/25/17 2106  ???  phytonadione (VITAMIN K) tablet 5 mg, 5 mg, Oral, QDAY, Ho, Trang, MD  ???  promethazine (PHENERGAN) tablet 25 mg, 25 mg, Oral, Q6H PRN, Ho, Trang, MD  ???  senna/docusate (SENOKOT-S) solution 10 mL, 10 mL, Oral, BID, Mohamad Alahmad, Mohamad, MD, 10 mL at 01/24/17 0946  ???  sevelamer carbonate (RENVELA) tablet 800 mg, 800 mg, Oral, TID w/ meals, Mohamad Alahmad, Mohamad, MD, 800 mg at 01/25/17 2108  ???  sodium chloride 0.9 %   infusion, 1,000 mL, Intravenous, IP Dialysis PRN, Nancy Fetter, MD  ???  sodium chloride 0.9 %   infusion, 250 mL, Intravenous, IP Dialysis PRN, Nancy Fetter, MD  ???  tiZANidine (ZANAFLEX) tablet 4 mg, 4 mg, Oral, Q8H PRN, Felecia Jan, Mohamad, MD, 4 mg at 01/19/17 4540  ???  vitamins, multi B, C, Zn & folate (Renal) (NEPHPLEX RX) tablet 1 tablet, 1 tablet, Oral, QDAY, Mitzie Na, MD, 1 tablet at 01/25/17 0935    Prior to Admission Analgesic Regimen:  N/A        Assessment:    (W)  Words to describe pain:  Sharp  (I)  Intensity of pain:  Moderate to severe               Patient's personal pain goal:  Mild  (L) Location of pain:  Abdomen, bilateral flanks, back  (D)  Duration of pain:  Constant   (A)  Aggravating/Alleviating factors:  Ambulation/ Rest, pain medications Last Bowel Movement: 01/25/2017    Opioid Calculations:     Date: PTA    None    Total oral morphine equivalent ~ 0 mg Date: 01/25/2017    Fentanyl IV ~ 5 mg  Hydrocodone-acetaminophen ~ 30 mg     Total oral morphine equivalent ~ 35 mg       Vitals:    01/26/17 0558 01/26/17 1418 01/26/17 1430 01/26/17 1500   BP: 121/83 (!) 162/114 (!) 156/110 (!) 143/110   Pulse: 111  108 108   Temp: 37.2 ???C (98.9 ???F)      SpO2: 97%  100% 100%   Weight: 117 kg (257 lb 15 oz)      Height:         Allergies   Allergen Reactions   ??? Advair Diskus [Fluticasone-Salmeterol] ANAPHYLAXIS and HIVES   ???  Adhesive Tape (Rosins) RASH   ??? Coumadin [Warfarin] RASH     Bleeding lesions       Basic Metabolic Profile    Lab Results   Component Value Date/Time    NA 136 (L) 01/26/2017 12:59 AM    K 3.9 01/26/2017 12:59 AM    CL 99 01/26/2017 12:59 AM    CO2 26 01/26/2017 12:59 AM    GAP 11 01/26/2017 12:59 AM    Lab Results   Component Value Date/Time    BUN 26 (H) 01/26/2017 12:59 AM    CR 10.49 (H) 01/26/2017 12:59 AM    GLU 95 01/26/2017 12:59 AM      `Hemogram   Lab Results   Component Value Date/Time    WBC 10.3 01/26/2017 12:59 AM    RBC 2.61 (L) 01/26/2017 12:59 AM    HGB 8.0 (L) 01/26/2017 12:59 AM    HCT 24.1 (L) 01/26/2017 12:59 AM    MCV 92.6 01/26/2017 12:59 AM    MCH 30.6 01/26/2017 12:59 AM    MCHC 33.0 01/26/2017 12:59 AM    RDW 16.8 (H) 01/26/2017 12:59 AM    PLTCT 312 01/26/2017 12:59 AM    MPV 6.6 (L) 01/26/2017 12:59 AM

## 2017-01-26 NOTE — Other
Critical result or procedure called (document test and value, and read back):  PTT 168.9  Time MD/NP Notified:  [email or web address]  MD/NP Name:  Dr. Anselm JunglingHo  MD/NP Response/Orders Given:  Re-draw PTT and pause heparin gtt until new result comes back

## 2017-01-26 NOTE — Progress Notes
Renal Progress Note    Name:  Debra Lam   AVWUJ'[id number] Date:  01/26/2017  Admission Date: 01/12/2017  LOS: 14 days          Assessment and Plan   Principal Problem:    CLABSI (central line-associated bloodstream infection)  Active Problems:    ESRD (end stage renal disease) on dialysis (HCC)    Morbid obesity (HCC)    HTN (hypertension)    OSA (obstructive sleep apnea)    Anemia    Acute deep vein thrombosis (DVT) of femoral vein of right lower extremity (HCC)        Debra Lam is a 21 y.o. 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  Sepsis  - febrile, leukocytosis, and tachycardic on admission               - has port, RLE tunneled HD (pulled), and PD catheter  Group B Strep bacteremia and S. aureus growing from [facility] tip, which was d/c 12/6  ???  Retroperitoneal bleed  - CT abd 12/12 with retroperitoneal bleed --> STAT IR: Right renal artery: Widely patent right renal artery. Multiple small areas of active contrast extravasation involving superior pole, interpolar, and lower pole renal artery branches s/p embolization  - CT abd 12/15  redistribution of the hematoma but no expansions.  A small amount of fluid was seen which was considered a dilute hemoperitoneum.  ???  DVT  - Partially occlusive acute appearing thrombus within the right external iliac and right common femoral veins  ???  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 SOB: +ve  parainfluenza - resolved   ???  HTN  OSA  Hx of PE  - 2016, likely provoked???while on OCP  - Was on eliquis, not currently on anticoagulation  ???  Iron deficiency anemia  Tsat 10%, ferritin 610 likely due to acute infection  ???  Hemoperitoneum  ???  Recommendations:  - Given unclear etiology for spontaneous renal artery branches (on anticoagulation) --> would re-image with CT abd/pelvis in future once hemorrhage has resorbed to exclude underlying abnormalities, such as renal cell carcinoma    - HD today; (plan for M/W/F)   - ID managing abx    pt discussed with Dr. Holley Raring, MD  Nephrology fellow  Pager [phone]      Subjective     Debra Lam is a 21 y.o. female     Seen on dialysis; tolerating treatment well; no new acute complaints.         Medications     Medications    MEDS    amLODIPine 10 mg Oral QDAY   anticoagulant sodium citrate 1-5 mL Intra-catheter Once in IP Dialysis   calcium acetate 1,334 mg Oral TID w/ meals   carvedilol 6.25 mg Oral BID   ceFAZolin  (ANCEF)  IV 2 g Intravenous QDAY(17)   cetirizine 5 mg Oral QAM8   cyclobenzaprine 5  mg Oral TID   dialysate w/1.5% dex (ULTRA) 2 L Intraperitoneal PD x1 Bag   lidocaine 1 patch Topical QDAY   lidocaine 1 patch Topical QDAY   lidocaine 1 patch Topical QDAY   pantoprazole DR 40 mg Oral BID(11-21)   phytonadione 5 mg Oral QDAY   senna/docusate 10 mL Oral BID   sevelamer carbonate 800 mg Oral TID w/ meals   vitamins, multi B, C, Zn & folate (Renal) 1 tablet Oral QDAY    IV MEDS  ??? heparin (porcine) 20,000 units/D5W 500 mL infusion (std conc)(premade) 1,200 Units/hr (01/26/17 1434)     Prn emu PRN, fentaNYL citrate PF Q6H PRN 25 mcg at 01/26/17 1130, HYDROcodone/acetaminophen Q4H PRN 2 tablet at 01/26/17 0700, ondansetron (ZOFRAN) IV Q6H PRN 4 mg at 01/26/17 1128, promethazine Q6H PRN, sodium chloride 0.9% (NS) IP Dialysis PRN, sodium chloride 0.9% (NS) IP Dialysis PRN, tiZANidine Q8H PRN 4 mg at 01/19/17 0527 Physical Exam     Vital Signs: Last Filed In 24 Hours Vital Signs: 24 Hour Range   BP: 143/110 (12/19 1500)  Temp: 37.2 ???C (98.9 ???F) (12/19 1478)  Pulse: 108 (12/19 1500)  Respirations: 23 PER MINUTE (12/19 1500)  SpO2: 100 % (12/19 1500)  O2 Delivery: None (Room Air) (12/19 1500)  SpO2 Pulse: 107 (12/19 1500) BP: (121-162)/(83-114)   Temp:  [36.6 ???C (97.9 ???F)-37.3 ???C (99.2 ???F)]   Pulse:  [108-117]   Respirations:  [18 PER MINUTE-23 PER MINUTE]   SpO2:  [97 %-100 %]   O2 Delivery: None (Room Air)   Intensity Pain Scale (Self Report): 5 (01/26/17 1415)        Intake/Output Summary (Last 24 hours) at 01/26/2017 1603  Last data filed at 01/26/2017 1415  Gross per 24 hour   Intake 817 ml   Output ???   Net 817 ml      Vitals:    01/24/17 1330 01/25/17 0609 01/26/17 0558   Weight: 120 kg (264 lb 8.8 oz) 113.6 kg (250 lb 7.1 oz) 117 kg (257 lb 15 oz)       Gen: Alert and Oriented, No Acute Distress   HEENT: Sclera normal; MMM, neck keloid  CV: no JVD, S1 and S2 normal, no rubs, murmurs or gallops   Pulm: Clear to Auscultation bilateral   GI: BS+ x4, non-tender to palpation  Neuro: Grossly normal, moving all extremities, speech intact  Ext: no edema, clubbing or cyanosis   Skin: no rash   + femoral HD CVC      Labs:      Recent Labs      01/24/17   0401  01/25/17   0407  01/26/17   0059   NA  133*  137  136*   K  3.6  3.7  3.9   CL  96*  98  99   CO2  26  30  26    GAP  11  9  11    BUN  29*  16  26*   CR  11.43*  7.46*  10.49*   GLU  93  91  95   CA  7.1*  7.9*  8.2*   ALBUMIN  2.9*  2.9*  3.0*   MG  2.5   --    --    PO4  6.5*  4.7*  5.5*       Recent Labs      01/23/17   1630  01/24/17   0401  01/24/17  1800  01/25/17   0407  01/25/17   1700  01/25/17   1845  01/26/17   0059  01/26/17   0805  01/26/17   0915   WBC  10.4  9.9  9.6  8.6  9.6   --   10.3   --    --    HGB  7.6*  7.2*  7.8*  8.0*  8.1*   --   8.0*   --    --    HCT  22.7*  21.8*  22.9*  23.6*  23.9*   --   24.1*   --    -- PLTCT  252  266  307  270  311   --   312   --    --    INR   --   2.0*   --   2.1*   --    --   2.8*   --    --    PTT   --   35.2   --   33.6   --   38.3*  46.7*  168.9*  48.8*   AST   --   14   --   11   --    --   14   --    --    ALT   --   <3*   --   <3*   --    --   <3*   --    --    ALKPHOS   --   80   --   80   --    --   81   --    --       Estimated Creatinine Clearance: 10.3 mL/min (A) (based on SCr of 10.49 mg/dL (H)).  Vitals:    01/24/17 1330 01/25/17 0609 01/26/17 0558   Weight: 120 kg (264 lb 8.8 oz) 113.6 kg (250 lb 7.1 oz) 117 kg (257 lb 15 oz)    No results for input(s): PHART, PO2ART in the last 72 hours.    Invalid input(s): PC02A

## 2017-01-26 NOTE — Progress Notes
labs obtained and labeled at bedside.

## 2017-01-26 NOTE — Telephone Encounter
BENEFIT COLLECTION:          ???  Verified by:???Tysen Roesler Magana Volveras???????????????????????????Date: January 26, 2017  ID #: [id number]    EFF:10/10/07 A&B   Subscriber:Self                           Ins Plan: Medicare A&B    Phone#:[phone]   Plan Type:Traditional  MEDICARE A&B - 2018 BENEFIT SUMMARY:  PART A: DAYS REFRESH AFTER 60 CONSECUTIVE OUTPT DAYS  DAYS 1-60 $1340 DEDUCTIBLE  DAYS 61-90 $335/DAY COPAY  DAYS 91-150 (USING ANY LIFETIME RESERVE DAYS) $670/DAY COPAY  DAYS > 150 PT PORTION 100%; after a 3-day minimum medically necessary inpatient hospital stay for a related illness or injury.  PART A/ SNF BENEFITS: IN 2018: DAYS 1-20 PT PORTION $0; DAYS 21-100 $167.50/DAY COPAY; DAYS > 100 PT PORTION 100%  PART B IN 2018: - 2018 PREM $134  DEDUCTIBLE $183, WITH 80% REIMBURSEMENT OF ALLOWABLE, NO OUT OF POCKET MAXIMUM  MEDICARE WILL PAY FOR DRUGS INFUSED THROUGH AN ITEM OF DME, LIKE AN INFUSION PUMP, OR DRUGS GIVEN BY A NEBULIZER WHEN GIVEN BY A LICENSED MEDICAL PROVIDER.  PART B WILL COVER IMMUNOSUPPRESSIVE DRUGS IF [facility] COVERED THE TRANSPLANT, EVEN AS SECONDARY PAYER  No Case Management, No transplant Network, No prior authorizations, No benefit limits  [employer] Reference Number: [id number]    RX Plan:[employer]?????????????????? Phone #:???[phone]   Drug Copay/ Coinsurance: $1.25 - $3.70   Current Subsidy: Full Benefit Dual Eligible Effective 02/09/16  Monthly Premium: $0  Annual Drug Deductible: $0     SECONDARY  ID #: [id number]   Subscriber:Self         Ins Plan:[employer]     Phone#:[phone]   Plan Type:[place]  [place] (prev [place] Medicaid): No Spend Down as of 01/26/2017  $48 INPT ADMIT COPAY  NURSING HOME/LONG TERM CARE [phone]  OUTPT HOSPITAL $3/VISIT  SURGICENTER $3/DOS  OUTPT REHAB $1/VISIT  DME $3/CLAIM  HHC $3/VISIT  NON-EMERGENCY TRANSPORT OR AMBULANCE $3/DOS  OV $2/COPAY  MENTAL HEALTH CALL [facility] [phone], $3/VISIT  DENTAL $3/DOS  RX $3/FILL (NO MAIL ORDER) If 2ndry to Medicare: PAYS [place] FOR IMMUNOS AFTER PART B  **IS IT COVERED? CALL [phone]  NO AUTHORIZATION REQUIRED WHEN SECONDARY   One[employer] Reference Number: [id number]

## 2017-01-26 NOTE — Progress Notes
Pt off unit to dialysis.

## 2017-01-26 NOTE — Case Management (ED)
Case Management Progress Note    NAME:Debra Lam                          MRN: [id number]              DOB:05-15-1995          AGE: 21 y.o.  ADMISSION DATE: 01/12/2017             DAYS ADMITTED: LOS: 14 days      Today???s Date: 01/26/2017    Plan  Anticipate DC home with family support, outpatient hemodialysis, medicaid transportation, and possible HHC.    Interventions  ? Support   Support: Pt/Family Updates re:POC or DC Plan, Patient Education   NCM met with patient's mother in the room. Pt off the floor for dialysis. NCM discuss patient's [place] Medicaid is active and NCM confirm transportation benefits are active. Pt's mother verbalize understanding and states that sister-in-law received a letter today confirming active medicaid.  NCM discuss plan for IV abx at dialysis rather than at home. Pt's mother verbalize understanding. NCM discuss may still utilize [facility] if necessary. Pt's mother verbalize understanding.    ? Info or Referral      ? Discharge Planning   Discharge Planning: Home Health, Home Infusion-Enter[facility], OP HD or PD   NCM contact [facility] [phone], discuss patient's plan to remain in the area until after IV abx completed, and discuss concerns for transportation. NCM confirmed plans per patient's mother of staying in the area, and NCM to contact [place] Medicaid to confirm benefits.  NCM also discuss IV abx with HD with clinic supervisor Alisha. [facility] will be able to facilitate IV abx with HD. NCM will send orders and clinical information nearer DC.  NCM contact [place] Medicaid, spoke with transportation liaison, and confirm patient's [place] medicaid, and transportation benefits are active. Will need to schedule HD transportation closer to DC once chair time confirmed.    ? Medication Needs      ? Financial   Financial: Medicare/Medicaid/SSDI Information   NCM contact [place] Medicaid, confirm patient is current and active and transportation benefits are active at this time.  ? Legal      ? Other        Disposition  ? Expected Discharge Date    Expected Discharge Date: 01/31/17  ? Transportation   Does the patient need discharge transport arranged?: No  Transportation Name, Phone and Availability #1: Johny Drilling (brother) - [phone] or Asher Muir (sister-in-law) - [phone]  ? Next Level of Care (Acute Psych discharges only)      ? Discharge Disposition                                          Durable Medical Equipment      No service has been selected for the patient.      [place] Destination      No service has been selected for the patient.      [facility] - Selection Complete      Service Provider Request Status Selected Services Address Phone Number Fax Number    [facility]ARE Selected Home Health Services [address], [place] [address] [phone]      [facility] - Selection Complete      Service  Provider Request Status Selected Services Address Phone Number Fax Number    [facility] INFUSION Selected Home Infusion and Injection [address], [place] [address] [phone]          Ellamae Sia RN BSN  Integrated Nurse Case Manager  [phone]/ [phone]

## 2017-01-27 ENCOUNTER — Encounter: Admit: 2017-01-27 | Discharge: 2017-01-27 | Payer: MEDICARE

## 2017-01-27 DIAGNOSIS — T80211A Bloodstream infection due to central venous catheter, initial encounter: Principal | ICD-10-CM

## 2017-01-27 LAB — COMPREHENSIVE METABOLIC PANEL
Lab: 0.3 mg/dL (ref 0.3–1.2)
Lab: 11 mL/min — ABNORMAL LOW (ref >60)
Lab: 12 mg/dL (ref 7–25)
Lab: 13 mL/min — ABNORMAL LOW (ref >60)
Lab: 135 MMOL/L — ABNORMAL LOW (ref 137–147)
Lab: 15 U/L (ref 7–40)
Lab: 3 U/L — ABNORMAL LOW (ref 7–56)
Lab: 3.2 g/dL — ABNORMAL LOW (ref 3.5–5.0)
Lab: 3.5 MMOL/L — ABNORMAL LOW (ref 3.5–5.1)
Lab: 30 MMOL/L (ref 21–30)
Lab: 5 mg/dL — ABNORMAL HIGH (ref 0.4–1.00)
Lab: 8 10*3/uL (ref 3–12)
Lab: 8.1 g/dL — ABNORMAL HIGH (ref 6.0–8.0)
Lab: 8.8 mg/dL (ref 8.5–10.6)
Lab: 92 mg/dL (ref 70–100)
Lab: 94 U/L (ref 25–110)
Lab: 97 MMOL/L — ABNORMAL LOW (ref 98–110)
Lab: 135 MMOL/L — ABNORMAL LOW (ref 137–147)

## 2017-01-27 LAB — CBC AND DIFF
Lab: 11 K/UL — ABNORMAL HIGH (ref >60)
Lab: 11 K/UL — ABNORMAL HIGH (ref >60)
Lab: 16 % — ABNORMAL HIGH (ref 11–15)
Lab: 33 g/dL (ref >60)
Lab: 8.3 g/dL — ABNORMAL LOW (ref 12.0–15.0)

## 2017-01-27 LAB — CULTURE-WOUND/TISSUE/FLUID(AEROBIC ONLY)W/SENSITIVITY

## 2017-01-27 LAB — CULTURE-BLOOD W/SENSITIVITY

## 2017-01-27 LAB — IRON + BINDING CAPACITY + %SAT+ FERRITIN
Lab: 16 % — ABNORMAL LOW (ref 28–42)
Lab: 174 ng/mL — ABNORMAL HIGH (ref 10–200)
Lab: 182 ug/dL — ABNORMAL LOW (ref 270–380)
Lab: 30 ug/dL — ABNORMAL LOW (ref 50–160)

## 2017-01-27 LAB — PROTIME INR (PT): Lab: 2.7 mmHg — ABNORMAL HIGH (ref 0.8–1.2)

## 2017-01-27 LAB — PTT (APTT)
Lab: 65 s — ABNORMAL HIGH (ref 24.0–36.5)
Lab: 140 s — ABNORMAL HIGH (ref >60)
Lab: 55 s — ABNORMAL HIGH (ref 24.0–36.5)
Lab: 71 s — ABNORMAL HIGH (ref 24.0–36.5)

## 2017-01-27 LAB — PHOSPHORUS: Lab: 4.9 mg/dL — ABNORMAL HIGH (ref 2.0–4.5)

## 2017-01-27 MED ORDER — FENTANYL CITRATE (PF) 50 MCG/ML IJ SOLN
25 ug | Freq: Two times a day (BID) | INTRAVENOUS | 0 refills | Status: DC | PRN
Start: 2017-01-27 — End: 2017-01-29

## 2017-01-27 MED ORDER — MELATONIN 3 MG PO TAB
3 mg | Freq: Every evening | ORAL | 0 refills | Status: DC | PRN
Start: 2017-01-27 — End: 2017-01-29
  Administered 2017-01-28: 05:00:00 3 mg via ORAL

## 2017-01-27 MED ORDER — EPOETIN ALFA 10,000 UNIT/ML IJ SOLN
10000 [IU] | SUBCUTANEOUS | 0 refills | Status: DC
Start: 2017-01-27 — End: 2017-01-29

## 2017-01-27 NOTE — Progress Notes
CRITICAL RESULT NOTIFICATION    Critical result or procedure called (document test and value, and read back): APTT 159.6  Time MD/NP Notified: 12/20 MPE  MD/NP Name: Dr. Anselm JunglingHo   MD/NP Response/Orders Given:

## 2017-01-27 NOTE — Progress Notes
Brief progress note:    Hb stable. Patient doing well.    Recommendation:  1. Please give 2nd dose of oral vitamin K 5 mg tomorrow  2. Please start on eliquis 2.5 mg po bid from tomorrow morning if Hb is stable (stop heparin and start eliquis )    Full note to follow. Discussed with Dr. Mylo RedVallurupalli.    Everlean AlstromHameem Alexi Geibel, MD  Fellow, Hem/Onc  [phone]

## 2017-01-27 NOTE — Progress Notes
General Progress Note    Name:  Debra Lam   Today's Date:  01/27/2017  Admission Date: 01/12/2017  LOS: 15 days                     Assessment/Plan:    Principal Problem:    CLABSI (central line-associated bloodstream infection)  Active Problems:    ESRD (end stage renal disease) on dialysis (HCC)    Morbid obesity (HCC)    HTN (hypertension)    OSA (obstructive sleep apnea)    Anemia    Acute deep vein thrombosis (DVT) of femoral vein of right lower extremity [facility])      Debra Lam is a 21 year old African-American female, originally from [place],???with past medical history of hypertension, peptic ulcer disease, pulmonary embolism, congenital hypoplastic kidney, 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  ???  1.???Right external iliac and right common femoral vein thrombosis, acute:  - Appears provoced due to presence of the HD catheter; now removed  - Had PE in 2017, was treated with coumadin but developed rash/hives, switched to eliquis which she used for 2-3 months (unsure of duration)  -???Was started on heparin gtt with a plan to transition to eliquis for long term use  - Had spontaneous bleeding in the retroperitoneal space, so AC discontinued???12/12???and IVC filter placed by IR???12/13  - Heparin drip re-started 12/18 @ 1800. Monitor aPTT  ???  2.???Right sided retroperitoneal hemorrhage in right pararenal space:  - No history of trauma, was on heparin gtt. Received one dose of eliquis 5 mg po  - Hgb dropped significantly 2/2 RP hematoma s/p Coil embolization bleeding right renal artery branches 12/12  - received 4 u PRBC, Hb now is???8.2, last transfusion 12/14  ???  3. ESRD on HD  ???  4. Elevated INR  - INR 2.7  -Was normal 12/5  -has been on weeks of antibiotics and also not eating well, likely vitamin K deficienct  ???  ???  Recommendations: - Please give the 2nd dose of vitamin K 5 mg PO tomorrow  - If Hb is stable, and pain stable, then please discontinue heparin tomorrow morning and start on eliquis 2.5 mg po bid from tomorrow.  - Will need to remove IVC filter once stable on anticoagulation.???  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  - Daily CBC, and INR    Plan discussed with Dr. Mylo Red.    Everlean Alstrom, MD  Fellow, Hem/Onc  [phone]  ________________________________________________________________________    Subjective  Debra Lam is a 21 y.o. female.  Patient sleeping. Mother at the bedside. Patient typically sleeps whole day and is awake at night. She told that her pain is better 5/10 now compared to 10/10 when it was worse few days ago. Pain mainly located in the lower abdomen and the right flank. No overt bleeding. No headache or dizziness.    Medications  Scheduled Meds:  amLODIPine (NORVASC) tablet 10 mg 10 mg Oral QDAY   calcium acetate (PHOSLO) capsule 1,334 mg 1,334 mg Oral TID w/ meals   carvedilol (COREG) tablet 6.25 mg 6.25 mg Oral BID   ceFAZolin (ANCEF) IVP 2 g 2 g Intravenous QDAY(17)   cetirizine (ZYRTEC) tablet 5 mg 5 mg Oral QAM8   cyclobenzaprine (FLEXERIL) tablet 5 mg 5 mg Oral TID  lidocaine (LIDODERM) 5 % topical patch 1 patch 1 patch Topical QDAY   lidocaine (LIDODERM) 5 % topical patch 1 patch 1 patch Topical QDAY   lidocaine (LIDODERM) 5 % topical patch 1 patch 1 patch Topical QDAY   pantoprazole DR (PROTONIX) tablet 40 mg 40 mg Oral BID(11-21)   phytonadione (VITAMIN K) tablet 5 mg 5 mg Oral QDAY   senna/docusate (SENOKOT-S) solution 10 mL 10 mL Oral BID   sevelamer carbonate (RENVELA) tablet 800 mg 800 mg Oral TID w/ meals   vitamins, multi B, C, Zn & folate (Renal) (NEPHPLEX RX) tablet 1 tablet 1 tablet Oral QDAY   Continuous Infusions:  ??? heparin (porcine) 20,000 units/D5W 500 mL infusion (std conc)(premade) 1,300 Units/hr (01/27/17 0940)     PRN and Respiratory Meds:emu PRN, fentaNYL citrate PF Q6H PRN, HYDROcodone/acetaminophen Q4H PRN, ondansetron (ZOFRAN) IV Q6H PRN, promethazine Q6H PRN      Review of Systems:  A 14 point review of systems was negative except for: :     Lower abdominal and right flank pain which is improving.    Objective:                          Vital Signs: Last Filed                 Vital Signs: 24 Hour Range   BP: 137/88 (12/20 0947)  Temp: 36.8 ???C (98.3 ???F) (12/20 0947)  Pulse: 113 (12/20 0947)  Respirations: 18 PER MINUTE (12/20 0947)  SpO2: 98 % (12/20 0947)  O2 Delivery: None (Room Air) (12/20 0947)  SpO2 Pulse: 114 (12/19 1530) BP: (133-163)/(88-127)   Temp:  [36.8 ???C (98.3 ???F)-37.7 ???C (99.9 ???F)]   Pulse:  [108-130]   Respirations:  [18 PER MINUTE-30 PER MINUTE]   SpO2:  [93 %-100 %]   O2 Delivery: None (Room Air)   Intensity Pain Scale (Self Report): 7 (01/27/17 0356) Vitals:    01/25/17 0609 01/26/17 0558 01/26/17 1830   Weight: 113.6 kg (250 lb 7.1 oz) 117 kg (257 lb 15 oz) 112.4 kg (247 lb 12.8 oz)       Intake/Output Summary:  (Last 24 hours)    Intake/Output Summary (Last 24 hours) at 01/27/2017 1122  Last data filed at 01/26/2017 1830  Gross per 24 hour   Intake 1440 ml   Output 6206 ml   Net -4766 ml      Stool Occurrence: 0    Physical Exam  General appearance:  alert, well-developed, cooperative and no distress  Lungs:  clear to auscultation bilaterally  Heart:  regular rate and rhythm, S1, S2 normal, no murmur, click, rub or gallop  Abdomen:  soft, non-tender. Bowel sounds normal. No masses,  no organomegaly, Mild tenderness in the lower abdomen.  Extremities:  extremities normal, atraumatic, no cyanosis or edema  Neurologic:  Grossly normal    Lab Review  Hematology:    Lab Results   Component Value Date    HGB 8.2 01/27/2017    HCT 24.3 01/27/2017    PLTCT 320 01/27/2017    WBC 11.4 01/27/2017    NEUT 76 01/27/2017    ANC 8.70 01/27/2017    LYMPH 10 01/22/2017 ALC 1.50 01/27/2017    MONA 8 01/27/2017    AMC 0.90 01/27/2017    ABC 0.00 01/27/2017    MCV 92.7 01/27/2017    MCHC 33.6 01/27/2017    MPV 7.3 01/27/2017    RDW 16.4  01/27/2017   , Coagulation:    Lab Results   Component Value Date    PTT 55.2 01/27/2017    INR 2.7 01/27/2017   , General Chemistry:    Lab Results   Component Value Date    NA 135 01/27/2017    K 3.8 01/27/2017    CL 97 01/27/2017    GAP 10 01/27/2017    BUN 15 01/27/2017    CR 6.72 01/27/2017    GLU 115 01/27/2017    CA 8.7 01/27/2017    ALBUMIN 3.0 01/27/2017    LACTIC 0.8 01/22/2017    OBSCA 0.78 01/21/2017    MG 2.5 01/24/2017    TOTBILI 0.3 01/27/2017   , Enzymes:    Lab Results   Component Value Date    AST 15 01/27/2017    ALT <3 01/27/2017    ALKPHOS 83 01/27/2017   , Cardiac markers:    Lab Results   Component Value Date    TNI 0.01 01/20/2017   , Mg and PO4:   Lab Results   Component Value Date    MG 2.5 01/24/2017   , Arterial BG : No results found for: PHART, PCO2A, PO2ART, HCO3A, BASEDEFA, O2SATACAL  , Endocrine:   Lab Results   Component Value Date    TSH 1.050 01/16/2017   , HgbA1C:   Lab Results   Component Value Date    HGBA1C 5.1 12/06/2016   , Lipid Profile: No results found for: CHOL, TRIG, HDL, LDL, VLDL, Sed Rate:   Lab Results   Component Value Date    ESR 44 01/13/2017    and CRP:   Lab Results   Component Value Date    CRP 22.92 01/13/2017

## 2017-01-27 NOTE — Progress Notes
Debra SagoSarah [id number]  Room 5317; ED;MPE  Patient feels a knot in right foot. Thinks she may have blood clot. Extremity is not red warm or swollen. Thanks.

## 2017-01-27 NOTE — Progress Notes
Hospitalist progress note    Debra Lam             Admission Date:  01/12/2017  Today's Date: 01/27/17                       Changes for today assessment and plan noted in bold.    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 Transferred to floor 12/5. Developed acute hypotension requiring rapid response and transferred to MICU for pressure support 12/12. Stabilized and transferred to floor 12/17.    ???  Bacteremia due to right femoral HD line, parainfluenza, and vaginal yeast infection with sepsis POA. ID consulted.  -Blood cultures 12/5 positive for streptococcus agalactiae  -Cultures puncture wound 12/5 positive for streptococcus agalactiae, corynebacterium species, and coagulase negative stap  -RVP 12/6 positive for parainfluenza 4  -CT lower extremity 12/6 showed right lower extremity tunnel femoral central venous catheter with subcutaneous stranding along the catheter tract within the upper thigh and groin likely postprocedure in nature; no evidence of abscess or subcutaneous gas or significant fascial fluid to suggest necrotizing fasciitis and peritoneal dialysis catheter in place with soft tissue thickening along the tract within the subcutaneous tissue likely representing scarring but no loculated fluid collection to suggest abscess  -Cath removed 12/6. Tip culture positive for staph auresus  -Blood cultures 12/6, 12/7, 12/10, and 12/14 are negative  -Peritoneal dialysate fluid culture 12/12 and 12/15 negative to date  -Fluconazole given 12/11.  -Completed a course of Zosyn (12/6-12/10) and Linezolid (12/6-12/10). Continue cefazolin (started 12/7).  Stop date 03/14/2017. -To help with ease of discharge, will see if ID is ok with cefazolin 2 gram on day 1 and 2 and 4 gram on day 3 of weekly dialysis for discharge.      Retroperitoneal bleed with acute blood loss anemia in setting of chronic anemia.  -Developed severe hypotension 12/12 requiring rapid response and transferred to ICU for pressure support  -CT abdomen and pelvis 12/12 showed large right pararenal space hemorrhage with active contrast extravasation  -Status post coil embolization of right renal artery by IR 12/12  -CTA abdomen for bleeding from peritoneal dialysis catheter 12/16 showed interval evolution and redistribution of a large multiloculated right perinephric/perirenal hematoma but no focal atrial extravastation  -Iron studies 12/8 consistent with chronic disease  -Status post 6 units of blood this admission.  -Continue H&H every 12 hours.  -We will need repeat CT abdomen and pelvis once hematoma has resolved to exclude other etiologies for her spontaneous retroperitoneal bleed  -Hemoglobin has been stable with start of heparin drip 12/18.      DVT in setting of prior PE contributed to OCP. Hematology consulted.    -Allergy to warfarin  -Bilateral LE [place] 12/8 showed partially occlusive acute appearing thrombus within the right external iliac and right common femoral veins  -Completed course of heparin gtt and transition to Eliquis prior to the development of retroperitoneal bleed.  -Given issues with active bleeding, anticoagulation was stopped on 12/12.    -IVC filter placed by IR 12/13. This will need to be removed once stable on oral anticoagulation.  -Will complete daily vitamin K  5 mg for 2 days tomorrow (12/20-12/21).  -Cotinue heparin gtt with goal PTT 60-80 without boluses.  PTT was at goal 12/19 at 1600.    -If she can tolerate therapeutic heparin, can likely transition to Eliquis 2.5 mg BID tomorrow.    -She will need 3 months of anticoagulation Abdominal pain likely due to retroperitoneal bleed in the setting of PUD due to NSAID use. Pain team consulted.    -EGD 12/10 showed normal stomach and duodenal erosions without bleeding  -H. pylori fecal antigen was negative  -Continue PPI BID.  -Continue zofran for nausea and start phenergan for nausea.  Will check EKG tomorrow given history of borderline QTc prolongation.  -Continue Noroc 1-2 tab every 4 hours prn and decrease fentanyl to 25 mcg every 12 hours prn pain.  -Continue lidocaine patch daily (back, leg, and right abdomen) and flexeril 5 mg TID scheduled  -Continue bowel regimen.    Hypotension due to acute blood loss in the setting of HTN. Resolved.  -ECHO 12/7 showed EF of 60-65% and no other significant finding  -Required Levophed briefly at transfer to MICU.  -Continue amlodipine 10 mg daily and carvedilol 6.25 mg BID.   -Continue to fosinopril since blood pressure at goal.    OSA.   -Noncompliant with CPAP prior to admission  -Continue CPAP.    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 Renal consulted.    -Outpatient regimen: [facility] T[facility]  -She was on PD for 6 months prior to admission due to lack of access but was changed to HD prior to admission  -Completed course of PD12/2-12/3.    -Due to retroperitoneal bleed, PD is no longer an option.   -Left femoral HD line placed by IR 12/13.  -First HD completed 12/4. Continue HD MWF while inpatient.  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 Continue Renvela and PhosLo    Grade 2 pressure ulcers located on buttocks. Wound team consulted.     FEN  -No IVF  -Lytes ok  -Renal diet  ???  DVT ppx: heparin gtt and SCD.    Full code.  Confirm with patient.  Veronica/mother is emergency contact: [phone] 4 hours 5 Disp: Continue inpatient care but if hemoglobin stable, can likely be discharged tomorrow if we can arrange for outpatient dialysis need.  We will touch base with ID to see if they are okay with suggested antibiotic plan.    Given issues with coordinating care with [facility], the patient is agreeable to stay in [place] until she is completed a course of IV antibiotics.  She will return to her brother's home in [place], [place].  Case management will try to facilitate for dialysis unit closer to their home.    I spent a total of 35 minutes in pt care today with an estimated 20 minutes spent reviewing chart and coordinating care. Reminder of time was spent examining patient, discussing care plan, and answering patient questions in the patient's room.    Complexity of medical decision making is high because of the multi-system nature of disease process.      Irving Copas, MD  Hospitalist  [phone]  __________________________________________________________________________________    Subjective: No overnight events.  No fevers or chills.  Feeling better.  No chest pain, palpitation, shortness of breath.  Abdominal pain and back pain improved.  No  bowel or bladder complaints.  Worried that she may have worsening DVT in her right lower extremity.  No other complaints.    Mother in room.  Care plan discussed.    Allergies:  Advair diskus [fluticasone-salmeterol]; Adhesive tape (rosins); and Coumadin [warfarin]    Medications:  Scheduled Meds:    amLODIPine (NORVASC) tablet 10 mg 10 mg Oral QDAY   calcium acetate (PHOSLO) capsule 1,334 mg 1,334 mg Oral TID w/ meals   carvedilol (COREG) tablet 6.25 mg 6.25 mg Oral BID   ceFAZolin (ANCEF) IVP 2 g 2 g Intravenous QDAY(17)   cetirizine (ZYRTEC) tablet 5 mg 5 mg Oral QAM8   cyclobenzaprine (FLEXERIL) tablet 5 mg 5 mg Oral TID   lidocaine (LIDODERM) 5 % topical patch 1 patch 1 patch Topical QDAY   lidocaine (LIDODERM) 5 % topical patch 1 patch 1 patch Topical QDAY lidocaine (LIDODERM) 5 % topical patch 1 patch 1 patch Topical QDAY   pantoprazole DR (PROTONIX) tablet 40 mg 40 mg Oral BID(11-21)   phytonadione (VITAMIN K) tablet 5 mg 5 mg Oral QDAY   senna/docusate (SENOKOT-S) solution 10 mL 10 mL Oral BID   sevelamer carbonate (RENVELA) tablet 800 mg 800 mg Oral TID w/ meals   vitamins, multi B, C, Zn & folate (Renal) (NEPHPLEX RX) tablet 1 tablet 1 tablet Oral QDAY   Continuous Infusions:  ??? heparin (porcine) 20,000 units/D5W 500 mL infusion (std conc)(premade) 1,300 Units/hr (01/27/17 0751)     PRN and Respiratory Meds:emu PRN, fentaNYL citrate PF Q6H PRN, HYDROcodone/acetaminophen Q4H PRN, ondansetron (ZOFRAN) IV Q6H PRN, promethazine Q6H PRN       Physical Exam:  Vital Signs: Last Filed In 24 Hours Vital Signs: 24 Hour Range   BP: 148/103 (12/20 0600)  Temp: 37.7 ???C (99.9 ???F) (12/20 0600)  Pulse: 117 (12/20 0600)  Respirations: 18 PER MINUTE (12/20 0600)  SpO2: 99 % (12/20 0600)  O2 Delivery: None (Room Air) (12/20 0600)  SpO2 Pulse: 114 (12/19 1530) BP: (133-163)/(88-127)   Temp:  [36.9 ???C (98.5 ???F)-37.7 ???C (99.9 ???F)]   Pulse:  [108-130]   Respirations:  [18 PER MINUTE-30 PER MINUTE]   SpO2:  [93 %-100 %]   O2 Delivery: None (Room Air)   Intensity Pain Scale (Self Report): 7 (01/27/17 0356)    Intake/Outpur Summary: (Last 24 hours)    Intake/Output Summary (Last 24 hours) at 01/27/2017 0815  Last data filed at 01/26/2017 1830  Gross per 24 hour   Intake 1440 ml   Output 6206 ml   Net -4766 ml     Physical Exam:    General: alert and oriented to person, place, and time.  No acute distress.  Heart: regular rhythm.  Normal rate.  Normal S1 and S2.  No mumrurs, rubs or gallops.  Lungs: clear to ascultation.  No wheezes, rales or coarseness.  Abdomen: Normal bowel sounds.  Soft and not distended.  Tender to palpation over her left lower quadrant.  PD catheter noted in her left lower quadrant.  Extremities: Left femoral HD catheter in place.  2+ pitting edema.  But no clubbing or cyanosis.  Distal pulses intact.     Laboratory:  Recent Labs      01/25/17   0407  01/26/17   0059  01/26/17   1710  01/27/17   0405   NA  137  136*  135*  135*   K  3.7  3.9  3.5  3.8   CL  98  99  97*  97*   CO2  30  26  30  28    BUN  16  26*  12  15   CR  7.46*  10.49*  5.08*  6.72*   GLU  91  95  92  115*   GAP  9  11  8  10    GFR  7*  5*  11*  8*   CA  7.9*  8.2*  8.8  8.7   PO4  4.7*  5.5*   --   4.9*     Recent Labs      01/25/17   0407  01/26/17   0059  01/26/17   1710  01/27/17   0405   ALKPHOS  80  81  94  83   AST  11  14  15  15    ALT  <3*  <3*  3*  <3*   TOTPROT  6.8  7.0  8.1*  7.4   TOTBILI  0.2*  0.3  0.3  0.3   ALBUMIN  2.9*  3.0*  3.2*  3.0*     Recent Labs      01/25/17   0407  01/25/17   1700  01/26/17   0059  01/26/17   1600  01/27/17   0405   HGB  8.0*  8.1*  8.0*  8.5*  8.2*   HCT  23.6*  23.9*  24.1*  25.6*  24.3*   WBC  8.6  9.6  10.3  9.6  11.4*   PLTCT  270  311  312  331  320   INR  2.1*   --   2.8*   --   2.7*     No results for input(s): PHART, PCO2A, PO2ART, HCO3A, O2SATACAL in the last 72 hours.

## 2017-01-28 ENCOUNTER — Inpatient Hospital Stay: Admit: 2017-01-19 | Discharge: 2017-01-19 | Payer: MEDICARE

## 2017-01-28 ENCOUNTER — Inpatient Hospital Stay: Admit: 2017-01-13 | Discharge: 2017-01-13 | Payer: MEDICARE

## 2017-01-28 ENCOUNTER — Inpatient Hospital Stay: Admit: 2017-01-14 | Discharge: 2017-01-14 | Payer: MEDICARE

## 2017-01-28 ENCOUNTER — Inpatient Hospital Stay: Admit: 2017-01-20 | Discharge: 2017-01-20 | Payer: MEDICARE

## 2017-01-28 ENCOUNTER — Inpatient Hospital Stay: Admit: 2017-01-22 | Discharge: 2017-01-22 | Payer: MEDICARE

## 2017-01-28 ENCOUNTER — Inpatient Hospital Stay: Admit: 2017-01-17 | Discharge: 2017-01-17 | Payer: MEDICARE

## 2017-01-28 ENCOUNTER — Encounter: Admit: 2017-01-28 | Discharge: 2017-01-28 | Payer: MEDICARE

## 2017-01-28 ENCOUNTER — Inpatient Hospital Stay: Admit: 2017-01-27 | Discharge: 2017-01-27 | Payer: MEDICARE

## 2017-01-28 DIAGNOSIS — E209 Hypoparathyroidism, unspecified: ICD-10-CM

## 2017-01-28 DIAGNOSIS — T8619 Other complication of kidney transplant: ICD-10-CM

## 2017-01-28 DIAGNOSIS — J111 Influenza due to unidentified influenza virus with other respiratory manifestations: ICD-10-CM

## 2017-01-28 DIAGNOSIS — R6520 Severe sepsis without septic shock: ICD-10-CM

## 2017-01-28 DIAGNOSIS — A419 Sepsis, unspecified organism: ICD-10-CM

## 2017-01-28 DIAGNOSIS — N179 Acute kidney failure, unspecified: ICD-10-CM

## 2017-01-28 DIAGNOSIS — J9601 Acute respiratory failure with hypoxia: ICD-10-CM

## 2017-01-28 DIAGNOSIS — Z9114 Patient's other noncompliance with medication regimen: ICD-10-CM

## 2017-01-28 DIAGNOSIS — G4733 Obstructive sleep apnea (adult) (pediatric): ICD-10-CM

## 2017-01-28 DIAGNOSIS — Z992 Dependence on renal dialysis: ICD-10-CM

## 2017-01-28 DIAGNOSIS — I82411 Acute embolism and thrombosis of right femoral vein: ICD-10-CM

## 2017-01-28 DIAGNOSIS — T80211A Bloodstream infection due to central venous catheter, initial encounter: Principal | ICD-10-CM

## 2017-01-28 DIAGNOSIS — M79651 Pain in right thigh: ICD-10-CM

## 2017-01-28 DIAGNOSIS — E875 Hyperkalemia: ICD-10-CM

## 2017-01-28 DIAGNOSIS — B9789 Other viral agents as the cause of diseases classified elsewhere: ICD-10-CM

## 2017-01-28 DIAGNOSIS — Z6841 Body Mass Index (BMI) 40.0 and over, adult: ICD-10-CM

## 2017-01-28 DIAGNOSIS — B348 Other viral infections of unspecified site: ICD-10-CM

## 2017-01-28 DIAGNOSIS — I82421 Acute embolism and thrombosis of right iliac vein: ICD-10-CM

## 2017-01-28 DIAGNOSIS — B373 Candidiasis of vulva and vagina: ICD-10-CM

## 2017-01-28 DIAGNOSIS — G9340 Encephalopathy, unspecified: ICD-10-CM

## 2017-01-28 DIAGNOSIS — Z9119 Patient's noncompliance with other medical treatment and regimen: ICD-10-CM

## 2017-01-28 DIAGNOSIS — N186 End stage renal disease: ICD-10-CM

## 2017-01-28 DIAGNOSIS — D631 Anemia in chronic kidney disease: ICD-10-CM

## 2017-01-28 LAB — CBC
Lab: 10 10*3/uL — ABNORMAL HIGH (ref 4.5–11.0)
Lab: 16 % — ABNORMAL HIGH (ref 11–15)
Lab: 28 % — ABNORMAL LOW (ref 36–45)
Lab: 3 M/UL — ABNORMAL LOW (ref 4.0–5.0)
Lab: 31 pg (ref 26–34)
Lab: 34 g/dL (ref 32.0–36.0)
Lab: 424 10*3/uL — ABNORMAL HIGH (ref 150–400)
Lab: 7 FL (ref 7–11)
Lab: 9.5 g/dL — ABNORMAL LOW (ref 12.0–15.0)
Lab: 92 FL (ref 80–100)

## 2017-01-28 LAB — PROTIME INR (PT): Lab: 1.5 MMOL/L — ABNORMAL HIGH (ref 0.8–1.2)

## 2017-01-28 LAB — PHOSPHORUS: Lab: 6.5 mg/dL — ABNORMAL HIGH (ref 2.0–4.5)

## 2017-01-28 LAB — PTT (APTT)
Lab: 62 s — ABNORMAL HIGH (ref 24.0–36.5)
Lab: 91 s — ABNORMAL HIGH (ref 24.0–36.5)
Lab: 47 s — ABNORMAL HIGH (ref 24.0–36.5)

## 2017-01-28 LAB — COMPREHENSIVE METABOLIC PANEL
Lab: 103 mg/dL — ABNORMAL HIGH (ref 70–100)
Lab: 134 MMOL/L — ABNORMAL LOW (ref 137–147)
Lab: 26 mg/dL — ABNORMAL HIGH (ref >60)
Lab: 9.4 mg/dL — ABNORMAL HIGH (ref >60)
Lab: 96 MMOL/L — ABNORMAL LOW (ref 98–110)

## 2017-01-28 LAB — CBC AND DIFF: Lab: 10 K/UL (ref 4.5–11.0)

## 2017-01-28 LAB — BUN: Lab: 7 mg/dL (ref 7–25)

## 2017-01-28 MED ORDER — PANTOPRAZOLE 40 MG PO TBEC
ORAL_TABLET | Freq: Two times a day (BID) | ORAL | 0 refills | 90.00000 days | Status: AC
Start: 2017-01-28 — End: ?
  Filled 2017-01-28 (×2): qty 120, 60d supply, fill #1

## 2017-01-28 MED ORDER — CALCITRIOL 0.5 MCG PO CAP
1 ug | ORAL_CAPSULE | Freq: Every day | ORAL | 3 refills | 30.00000 days | Status: AC
Start: 2017-01-28 — End: ?

## 2017-01-28 MED ORDER — HEPARIN, PORCINE (PF) 100 UNIT/ML IV SYRG
500 [IU] | Freq: Once | 0 refills | Status: CP
Start: 2017-01-28 — End: ?

## 2017-01-28 MED ORDER — CYCLOBENZAPRINE 5 MG PO TAB
5 mg | ORAL_TABLET | Freq: Three times a day (TID) | ORAL | 3 refills | 30.00000 days | Status: AC
Start: 2017-01-28 — End: ?
  Filled 2017-01-28 (×2): qty 90, 30d supply, fill #1

## 2017-01-28 MED ORDER — HYDROCODONE-ACETAMINOPHEN 5-325 MG PO TAB
1 | ORAL_TABLET | ORAL | 0 refills | Status: SS | PRN
Start: 2017-01-28 — End: 2017-02-13

## 2017-01-28 MED ORDER — PHYTONADIONE (VITAMIN K1) 5 MG PO TAB
5 mg | Freq: Once | ORAL | 0 refills | Status: CP
Start: 2017-01-28 — End: ?
  Administered 2017-01-28: 23:00:00 5 mg via ORAL

## 2017-01-28 MED ORDER — EPOETIN ALFA 10,000 UNIT/ML IJ SOLN
10000 [IU] | SUBCUTANEOUS | 0 refills | 15.00000 days | Status: AC
Start: 2017-01-28 — End: ?

## 2017-01-28 MED ORDER — ANTICOAG SODIUM CITRATE SOLN 5ML
1-5 mL | Freq: Once | 0 refills | Status: CP
Start: 2017-01-28 — End: ?

## 2017-01-28 MED ORDER — DIALYSATE W/1.5% DEX ULTRA 2000ML
2 L | INTRAPERITONEAL | 0 refills | Status: CP
Start: 2017-01-28 — End: ?
  Administered 2017-01-28: 20:00:00 2 L via INTRAPERITONEAL

## 2017-01-28 MED ORDER — AMLODIPINE 10 MG PO TAB
10 mg | ORAL_TABLET | Freq: Every day | ORAL | 3 refills | Status: AC
Start: 2017-01-28 — End: ?

## 2017-01-28 MED ORDER — CEFAZOLIN IVPB
3 g | Freq: Once | INTRAVENOUS | 0 refills | Status: CP
Start: 2017-01-28 — End: ?
  Administered 2017-01-28 (×2): 3 g via INTRAVENOUS

## 2017-01-28 MED ORDER — SODIUM CHLORIDE 0.9 % IV SOLP
1000 mL | INTRAVENOUS | 0 refills | Status: DC | PRN
Start: 2017-01-28 — End: 2017-01-29

## 2017-01-28 MED ORDER — SEVELAMER CARBONATE 800 MG PO TAB
800 mg | ORAL_TABLET | Freq: Three times a day (TID) | ORAL | 3 refills | Status: AC
Start: 2017-01-28 — End: ?
  Filled 2017-01-28 (×2): qty 270, 10d supply, fill #1

## 2017-01-28 MED ORDER — CETIRIZINE 5 MG PO TAB
5 mg | ORAL_TABLET | Freq: Every morning | ORAL | 3 refills | Status: AC
Start: 2017-01-28 — End: ?

## 2017-01-28 MED ORDER — APIXABAN 2.5 MG PO TAB
2.5 mg | ORAL_TABLET | Freq: Two times a day (BID) | ORAL | 0 refills | Status: SS
Start: 2017-01-28 — End: 2017-02-13

## 2017-01-28 MED ORDER — SODIUM CHLORIDE 0.9 % IV SOLP
300 mL | INTRAVENOUS | 0 refills | Status: CP | PRN
Start: 2017-01-28 — End: ?
  Administered 2017-01-28: 13:00:00 300 mL via INTRAVENOUS

## 2017-01-28 MED ORDER — PROMETHAZINE 25 MG PO TAB
25 mg | ORAL_TABLET | ORAL | 3 refills | 7.00000 days | Status: AC | PRN
Start: 2017-01-28 — End: ?
  Filled 2017-01-28 (×2): qty 30, 8d supply, fill #1

## 2017-01-28 MED ORDER — CALCITRIOL 0.5 MCG PO CAP
1 ug | ORAL_CAPSULE | Freq: Every day | ORAL | 3 refills | 30.00000 days | Status: AC
Start: 2017-01-28 — End: 2017-01-28

## 2017-01-28 MED ORDER — FLUCONAZOLE 150 MG PO TAB
150 mg | ORAL_TABLET | Freq: Every day | ORAL | 0 refills | 3.00000 days | Status: AC | PRN
Start: 2017-01-28 — End: ?
  Filled 2017-01-28 (×2): qty 3, 3d supply, fill #1

## 2017-01-28 MED ORDER — CEFAZOLIN 1 GRAM IJ SOLR
0 refills | 8.00000 days | Status: AC
Start: 2017-01-28 — End: ?

## 2017-01-28 MED ORDER — SODIUM CHLORIDE 0.9 % IV SOLP
250 mL | INTRAVENOUS | 0 refills | Status: DC | PRN
Start: 2017-01-28 — End: 2017-01-29

## 2017-01-28 MED ORDER — APIXABAN 2.5 MG PO TAB
2.5 mg | Freq: Two times a day (BID) | ORAL | 0 refills | Status: DC
Start: 2017-01-28 — End: 2017-01-29
  Administered 2017-01-28: 19:00:00 2.5 mg via ORAL

## 2017-01-28 MED ADMIN — ANTICOAG SODIUM CITRATE SOLN 5ML [210585]: 5 mL | @ 13:00:00 | Stop: 2017-01-28 | NDC 54029018302

## 2017-01-28 MED FILL — APIXABAN 2.5 MG PO TAB: 2.5 mg | ORAL | 9 days supply | Qty: 180 | Fill #1 | Status: CP

## 2017-01-28 MED FILL — HYDROCODONE-ACETAMINOPHEN 5-325 MG PO TAB: 5/325 mg | ORAL | 7 days supply | Qty: 42 | Fill #1 | Status: CP

## 2017-01-28 NOTE — Progress Notes
Renal Progress Note    Name:  Debra Lam   ZOXWR'[id number] Date:  01/27/2017  Admission Date: 01/12/2017  LOS: 15 days          Assessment and Plan   Principal Problem:    CLABSI (central line-associated bloodstream infection)  Active Problems:    ESRD (end stage renal disease) on dialysis (HCC)    Morbid obesity (HCC)    HTN (hypertension)    OSA (obstructive sleep apnea)    Anemia    Acute deep vein thrombosis (DVT) of femoral vein of right lower extremity (HCC)        Debra Lam is a 21 y.o. 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  Sepsis  - febrile, leukocytosis, and tachycardic on admission               - has port, RLE tunneled HD (pulled), and PD catheter  Group B Strep bacteremia and S. aureus growing from [facility] tip, which was d/c 12/6  ???  Retroperitoneal bleed  - CT abd 12/12 with retroperitoneal bleed --> STAT IR: Right renal artery: Widely patent right renal artery. Multiple small areas of active contrast extravasation involving superior pole, interpolar, and lower pole renal artery branches s/p embolization  - CT abd 12/15  redistribution of the hematoma but no expansions.  A small amount of fluid was seen which was considered a dilute hemoperitoneum.  ???  DVT  - Partially occlusive acute appearing thrombus within the right external iliac and right common femoral veins  ???  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 SOB: +ve  parainfluenza - resolved   ???  HTN  OSA  Hx of PE  - 2016, likely provoked???while on OCP  - Was on eliquis, not currently on anticoagulation  ???  Iron deficiency anemia  Tsat 10%, ferritin 610 likely due to acute infection  ???  Hemoperitoneum  ???  Recommendations:  - Given unclear etiology for spontaneous renal artery branches (on anticoagulation) --> would re-image with CT abd/pelvis in future once hemorrhage has resorbed to exclude underlying abnormalities, such as renal cell carcinoma    - ID managing abx   - long term dialysis plan is very complicated and decision making is complex. Will keep PD catheter for now as she has sever vasculopathy and I am concerned if she looses her T femoral CVC her access options are becoming more limited. Will discuss case with outpatient nephrologist. I spent > 35 min interviewing this pt and examining her and coordinating her care. > 50% counseling her on long term access and RRT options.    Otis Peak, MD  [phone]      Subjective     Debra Lam is a 21 y.o. female   Seen in her room. Concerned about new DVT        Medications     Medications    MEDS    amLODIPine 10 mg Oral QDAY  carvedilol 6.25 mg Oral BID   ceFAZolin  (ANCEF)  IV 2 g Intravenous QDAY(17)   cetirizine 5 mg Oral QAM8   cyclobenzaprine 5 mg Oral TID   [START ON 01/28/2017] epoetin 10,000 Units Subcutaneous Once per day on Mon Wed Fri   lidocaine 1 patch Topical QDAY   lidocaine 1 patch Topical QDAY   lidocaine 1 patch Topical QDAY   pantoprazole DR 40 mg Oral BID(11-21)   phytonadione 5 mg Oral QDAY   senna/docusate 10 mL Oral BID   sevelamer carbonate 800 mg Oral TID w/ meals   vitamins, multi B, C, Zn & folate (Renal) 1 tablet Oral QDAY    IV MEDS  ??? heparin (porcine) 20,000 units/D5W 500 mL infusion (std conc)(premade) 1,300 Units/hr (01/27/17 1537)     Prn emu PRN, fentaNYL citrate PF Q12H PRN, HYDROcodone/acetaminophen Q4H PRN 2 tablet at 01/27/17 1715, ondansetron (ZOFRAN) IV Q6H PRN 4 mg at 01/27/17 1423, promethazine Q6H PRN 25 mg at 01/26/17 1955           Physical Exam     Vital Signs: Last Filed In 24 Hours Vital Signs: 24 Hour Range   BP: 135/86 (12/20 2120)  Temp: 37.1 ???C (98.7 ???F) (12/20 2120)  Pulse: 112 (12/20 2120)  Respirations: 18 PER MINUTE (12/20 2120)  SpO2: 98 % (12/20 2120)  O2 Delivery: None (Room Air) (12/20 2120) BP: (129-148)/(86-103)   Temp:  [36.8 ???C (98.3 ???F)-37.7 ???C (99.9 ???F)]   Pulse:  [105-117]   Respirations:  [18 PER MINUTE]   SpO2:  [95 %-99 %]   O2 Delivery: None (Room Air)   Intensity Pain Scale (Self Report): 7 (01/27/17 0930)        Intake/Output Summary (Last 24 hours) at 01/27/2017 2134  Last data filed at 01/27/2017 1200  Gross per 24 hour   Intake 0 ml   Output 0 ml   Net 0 ml      Vitals:    01/25/17 0609 01/26/17 0558 01/26/17 1830   Weight: 113.6 kg (250 lb 7.1 oz) 117 kg (257 lb 15 oz) 112.4 kg (247 lb 12.8 oz)       Gen: Alert and Oriented, No Acute Distress   HEENT: Sclera normal; MMM, neck keloid  CV: no JVD, S1 and S2 normal, no rubs, murmurs or gallops   Pulm: Clear to Auscultation bilateral   GI: BS+ x4,  Mildly tender to palpation, + PD   Neuro: Grossly normal, moving all extremities, speech intact  Ext: no edema, clubbing or cyanosis   Skin: no rash   + femoral HD CVC      Labs:      Recent Labs      01/25/17   0407  01/26/17   0059  01/26/17   1710  01/27/17   0405   NA  137  136*  135*  135*   K  3.7  3.9  3.5  3.8   CL  98  99  97*  97*   CO2  30  26  30  28    GAP  9  11  8  10    BUN  16  26*  12  15   CR  7.46*  10.49*  5.08*  6.72*   GLU  91  95  92  115*   CA  7.9*  8.2*  8.8  8.7   ALBUMIN  2.9*  3.0*  3.2*  3.0*   PO4  4.7*  5.5*   --   4.9*       Recent Labs      01/25/17   0407  01/25/17   1700  01/25/17   1845  01/26/17   0059  01/26/17   0805  01/26/17   0915  01/26/17   1600  01/26/17   1710  01/26/17   2204  01/27/17   0405  01/27/17   0520  01/27/17   1415   WBC  8.6  9.6   --   10.3   --    --   9.6   --    --   11.4*   --   11.3* HGB  8.0*  8.1*   --   8.0*   --    --   8.5*   --    --   8.2*   --   8.3*   HCT  23.6*  23.9*   --   24.1*   --    --   25.6*   --    --   24.3*   --   25.0*   PLTCT  270  311   --   312   --    --   331   --    --   320   --   367   INR  2.1*   --    --   2.8*   --    --    --    --    --   2.7*   --    --    PTT  33.6   --   38.3*  46.7*  168.9*  48.8*  66.3*   --   65.7*  140.2*  55.2*  71.3*   AST  11   --    --   14   --    --    --   15   --   15   --    --    ALT  <3*   --    --   <3*   --    --    --   3*   --   <3*   --    --    ALKPHOS  80   --    --   81   --    --    --   94   --   83   --    --       Estimated Creatinine Clearance: 15.7 mL/min (A) (based on SCr of 6.72 mg/dL (H)).  Vitals:    01/25/17 0609 01/26/17 0558 01/26/17 1830   Weight: 113.6 kg (250 lb 7.1 oz) 117 kg (257 lb 15 oz) 112.4 kg (247 lb 12.8 oz)    No results for input(s): PHART, PO2ART in the last 72 hours.    Invalid input(s): PC02A

## 2017-01-28 NOTE — Progress Notes
General Progress Note    Name:  Debra Lam   Today's Date:  01/28/2017  Admission Date: 01/12/2017  LOS: 16 days                     Assessment/Plan:    Principal Problem:    CLABSI (central line-associated bloodstream infection)  Active Problems:    ESRD (end stage renal disease) on dialysis (HCC)    Morbid obesity (HCC)    HTN (hypertension)    OSA (obstructive sleep apnea)    Anemia    Acute deep vein thrombosis (DVT) of femoral vein of right lower extremity (HCC)    21 year old African-American female, originally from [place],???with past medical history of hypertension, peptic ulcer disease, pulmonary embolism, congenital hypoplastic kidney, 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  ???  1.???Right external iliac and right common femoral vein thrombosis, acute:  - Appears provoked due to presence of the HD catheter; now removed  - Had PE in 2017, was treated with coumadin but developed rash/hives, switched to eliquis which she used for 2-3 months (unsure of duration)  -???Was started on heparin gtt with a plan to transition to eliquis for long term use  - Had spontaneous bleeding in the retroperitoneal space, so AC discontinued???12/12???and IVC filter placed by IR???12/13  -???Heparin drip re-started 12/18 @ 1800. Monitor aPTT    ???  2.???Right sided retroperitoneal hemorrhage in right pararenal space:  - No history of trauma, was on heparin gtt. Received one dose of eliquis 5 mg po  - Hgb dropped significantly???2/2 RP hematoma s/p???Coil embolization bleeding right renal artery branches???12/12  -???received 4 u PRBC, Hb now is???9.5, last transfusion 12/14  ???  3. ESRD on HD  ???  4. Hx Elevated INR  - s/p 1 dose of vitamin K on 01/27/17   - INR 1.5 today, PTT 47.9     ???  ???  Recommendations:  - Please give the 2nd dose of vitamin K 5 mg PO (dose on 12/19 not given) s/p 1 previous dose on 12/20 - Since Hb is stable at 9.5, please discontinue heparin today and start on eliquis 2.5 mg po bid   - Will need to remove IVC filter once stable on anticoagulation.???  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    ???  Plan discussed with Dr. Mylo Red.  ???      ________________________________________________________________________    Subjective  Debra Lam is a 21 y.o. female.  Patient very lethargic today following dialysis, complains of some mild abdominal discomfort.      Medications  Scheduled Meds:  amLODIPine (NORVASC) tablet 10 mg 10 mg Oral QDAY   apixaban (ELIQUIS) tablet 2.5 mg 2.5 mg Oral BID   carvedilol (COREG) tablet 6.25 mg 6.25 mg Oral BID   ceFAZolin (ANCEF) 3 g in dextrose 5% (D5W) 50 mL IVPB 3 g Intravenous ONCE   cetirizine (ZYRTEC) tablet 5 mg 5 mg Oral QAM8   cyclobenzaprine (FLEXERIL) tablet 5 mg 5 mg Oral TID   dialysate w/1.5% dex (ULTRA) infusion 2 L 2 L Intraperitoneal PD x1 Bag   epoetin alfa (PROCRIT) injection 10,000 Units 10,000 Units Subcutaneous Once per day on Mon Wed Fri   heparin lock flush PF syringe 500 Units 500 Units Intra-catheter ONCE   HEPARIN, PORCINE (PF)  100 UNIT/ML IV SYRG (Cabinet Override)   NOW   lidocaine (LIDODERM) 5 % topical patch 1 patch 1 patch Topical QDAY   lidocaine (LIDODERM) 5 % topical patch 1 patch 1 patch Topical QDAY   lidocaine (LIDODERM) 5 % topical patch 1 patch 1 patch Topical QDAY   pantoprazole DR (PROTONIX) tablet 40 mg 40 mg Oral BID(11-21)   senna/docusate (SENOKOT-S) solution 10 mL 10 mL Oral BID   sevelamer carbonate (RENVELA) tablet 800 mg 800 mg Oral TID w/ meals   vitamins, multi B, C, Zn & folate (Renal) (NEPHPLEX RX) tablet 1 tablet 1 tablet Oral QDAY   Continuous Infusions:  PRN and Respiratory Meds:emu PRN, fentaNYL citrate PF Q12H PRN, HYDROcodone/acetaminophen Q4H PRN, melatonin QHS PRN, ondansetron (ZOFRAN) IV Q6H PRN, promethazine Q6H PRN, sodium chloride 0.9% (NS) IP Dialysis PRN, sodium chloride 0.9% (NS) IP Dialysis PRN      Review of Systems:  Constitutional: positive for fatigue   Abdominal discomfort     Objective:                          Vital Signs: Last Filed                 Vital Signs: 24 Hour Range   BP: 138/86 (12/21 1159)  Temp: 37.6 ???C (99.7 ???F) (12/21 1159)  Pulse: 123 (12/21 1159)  Respirations: 20 PER MINUTE (12/21 1159)  SpO2: 95 % (12/21 1159)  O2 Delivery: None (Room Air) (12/21 1159)  SpO2 Pulse: 119 (12/21 1130) BP: (117-148)/(82-102)   Temp:  [36.4 ???C (97.5 ???F)-37.6 ???C (99.7 ???F)]   Pulse:  [103-125]   Respirations:  [16 PER MINUTE-25 PER MINUTE]   SpO2:  [94 %-99 %]   O2 Delivery: None (Room Air)   Intensity Pain Scale (Self Report): 0 (01/28/17 1130) Vitals:    01/26/17 1830 01/28/17 0629 01/28/17 1130   Weight: 112.4 kg (247 lb 12.8 oz) 111.2 kg (245 lb 2.4 oz) 108.4 kg (238 lb 15.7 oz)       Intake/Output Summary:  (Last 24 hours)    Intake/Output Summary (Last 24 hours) at 01/28/2017 1333  Last data filed at 01/28/2017 1200  Gross per 24 hour   Intake 857 ml   Output 3300 ml   Net -2443 ml      Stool Occurrence: 0    Physical Exam  General: A&O, no apparent distress  Head: Normocephalic, without obvious abnormality, atraumatic   Eyes:  anicteric sclera, conjunctiva and lids normal  Nose: Nares normal. Septum midline. Mucosa normal. No drainageor sinus tenderness   Neck: Supple, no rigidity  Oropharynx: No erythema, exudates, lesions or petechiae  Respiratory: Clear to ausculation bilaterally, no crackles/rhonchi/wheezes  Cardiovascular: tachycardic, No murmurs, rubs, clicks or gallops  Abdomen: BS normal all quadrants, soft, non-tender, non-distended  Neurological: No focal deficits. Normal strength and sensation throughout  Extremities: No peripheral edema, no cyanosis or clubbing  Pulses: 3+ and symmetric, all extremities   Skin: Color, texturs and turgor normal. No rashes or lesions Psychosocial: Appropriate      Lab Review  24-hour labs:    Results for orders placed or performed during the hospital encounter of 01/12/17 (from the past 24 hour(s))   CBC AND DIFF    Collection Time: 01/27/17  2:15 PM   Result Value Ref Range    White Blood Cells 11.3 (H) 4.5 - 11.0 K/UL    RBC 2.69 (L) 4.0 - 5.0 M/UL  Hemoglobin 8.3 (L) 12.0 - 15.0 GM/DL    Hematocrit 16.1 (L) 36 - 45 %    MCV 93.0 80 - 100 FL    MCH 30.7 26 - 34 PG    MCHC 33.0 32.0 - 36.0 G/DL    RDW 09.6 (H) 11 - 15 %    Platelet Count 367 150 - 400 K/UL    MPV 7.1 7 - 11 FL    Neutrophils 80 (H) 41 - 77 %    Lymphocytes 12 (L) 24 - 44 %    Monocytes 8 4 - 12 %    Eosinophils 0 0 - 5 %    Basophils 0 0 - 2 %    Absolute Neutrophil Count 9.00 (H) 1.8 - 7.0 K/UL    Absolute Lymph Count 1.40 1.0 - 4.8 K/UL    Absolute Monocyte Count 0.90 (H) 0 - 0.80 K/UL    Absolute Eosinophil Count 0.00 0 - 0.45 K/UL    Absolute Basophil Count 0.00 0 - 0.20 K/UL   PTT (APTT)    Collection Time: 01/27/17  2:15 PM   Result Value Ref Range    APTT 71.3 (H) 24.0 - 36.5 SEC   PTT (APTT)    Collection Time: 01/27/17  9:32 PM   Result Value Ref Range    APTT 62.7 (H) 24.0 - 36.5 SEC   PROTIME INR (PT)    Collection Time: 01/28/17  3:04 AM   Result Value Ref Range    INR 1.5 (H) 0.8 - 1.2   PTT (APTT)    Collection Time: 01/28/17  3:04 AM   Result Value Ref Range    APTT 91.7 (H) 24.0 - 36.5 SEC   CBC AND DIFF    Collection Time: 01/28/17  3:04 AM   Result Value Ref Range    White Blood Cells 10.7 4.5 - 11.0 K/UL    RBC 2.52 (L) 4.0 - 5.0 M/UL    Hemoglobin 7.8 (L) 12.0 - 15.0 GM/DL    Hematocrit 04.5 (L) 36 - 45 %    MCV 92.8 80 - 100 FL    MCH 31.1 26 - 34 PG    MCHC 33.5 32.0 - 36.0 G/DL    RDW 40.9 (H) 11 - 15 %    Platelet Count 357 150 - 400 K/UL    MPV 6.9 (L) 7 - 11 FL    Neutrophils 75 41 - 77 %    Lymphocytes 14 (L) 24 - 44 %    Monocytes 8 4 - 12 %    Eosinophils 3 0 - 5 %    Basophils 0 0 - 2 %    Absolute Neutrophil Count 8.00 (H) 1.8 - 7.0 K/UL Absolute Lymph Count 1.50 1.0 - 4.8 K/UL    Absolute Monocyte Count 0.80 0 - 0.80 K/UL    Absolute Eosinophil Count 0.30 0 - 0.45 K/UL    Absolute Basophil Count 0.00 0 - 0.20 K/UL   COMPREHENSIVE METABOLIC PANEL    Collection Time: 01/28/17  3:04 AM   Result Value Ref Range    Sodium 134 (L) 137 - 147 MMOL/L    Potassium 3.7 3.5 - 5.1 MMOL/L    Chloride 96 (L) 98 - 110 MMOL/L    Glucose 103 (H) 70 - 100 MG/DL    Blood Urea Nitrogen 26 (H) 7 - 25 MG/DL    Creatinine 8.11 (H) 0.4 - 1.00 MG/DL    Calcium 8.2 (L) 8.5 - 10.6  MG/DL    Total Protein 7.0 6.0 - 8.0 G/DL    Total Bilirubin 0.3 0.3 - 1.2 MG/DL    Albumin 2.9 (L) 3.5 - 5.0 G/DL    Alk Phosphatase 84 25 - 110 U/L    AST (SGOT) 14 7 - 40 U/L    CO2 26 21 - 30 MMOL/L    ALT (SGPT) <3 (L) 7 - 56 U/L    Anion Gap 12 3 - 12    eGFR Non African American 5 (L) >60 mL/min    eGFR African American 6 (L) >60 mL/min   PHOSPHORUS    Collection Time: 01/28/17  3:04 AM   Result Value Ref Range    Phosphorus 6.5 (H) 2.0 - 4.5 MG/DL   CBC    Collection Time: 01/28/17  8:00 AM   Result Value Ref Range    White Blood Cells 10.3 4.5 - 11.0 K/UL    RBC 3.03 (L) 4.0 - 5.0 M/UL    Hemoglobin 9.5 (L) 12.0 - 15.0 GM/DL    Hematocrit 16.1 (L) 36 - 45 %    MCV 92.6 80 - 100 FL    MCH 31.4 26 - 34 PG    MCHC 34.0 32.0 - 36.0 G/DL    RDW 09.6 (H) 11 - 15 %    Platelet Count 424 (H) 150 - 400 K/UL    MPV 7.0 7 - 11 FL   PTT (APTT)    Collection Time: 01/28/17 11:00 AM   Result Value Ref Range    APTT 47.9 (H) 24.0 - 36.5 SEC   BUN    Collection Time: 01/28/17 11:15 AM   Result Value Ref Range    Blood Urea Nitrogen 7 7 - 25 MG/DL       Point of Care Testing  (Last 24 hours)  Glucose: (!) 103 (01/28/17 0304)    Radiology and other Diagnostics Review:    Pertinent radiology reviewed.    Kipp Laurence, APRN-NP   Pager

## 2017-01-28 NOTE — Case Management (ED)
Case Management Progress Note    NAME:Debra Lam                          MRN: [id number]              DOB:16-Jul-1995          AGE: 21 y.o.  ADMISSION DATE: 01/12/2017             DAYS ADMITTED: LOS: 16 days      Today???s Date: 01/28/2017    Plan  DC home today with outpatient dialysis services at [facility].  Medicaid transportation arranged for patient and mother for DC home.    Interventions  ? Support   Support: Pt/Family Updates re:POC or DC Plan  ? Info or Referral      ? Discharge Planning   Discharge Planning: OP HD or PD  ? Medication Needs   Medication Needs: Medication Prior-Auth   NCM received call from outpatient pharmacy regarding PA required for calcitriol.   NCM contact [employer] regarding PA. Medication is covered as part of renal package with Medicare Part B through the dialysis provider. Medication is not covered as part of medicare Part D plan.  NCM update Dr. Anselm Jungling, Med-E attending.    ? Financial   Financial: Medicare/Medicaid/SSDI Information  ? Legal      ? Other        Disposition  ? Expected Discharge Date    Expected Discharge Date: 01/28/17  ? Transportation   Does the patient need discharge transport arranged?: No  Transportation Name, Phone and Availability #1: Johny Drilling (brother) - [phone] or Asher Muir (sister-in-law) - [phone]  ? Next Level of Care (Acute Psych discharges only)      ? Discharge Disposition                                          Durable Medical Equipment      No service has been selected for the patient.      [place] [facility]      No service has been selected for the patient.      [facility]      No service has been selected for the patient.      [facility] - Selection Complete      Service Provider Request Status Selected Services Address Phone Number Fax Number    [facility]Center Dialysis [address], [place] [address] [phone]          Ellamae Sia RN BSN Integrated Nurse Case Manager  [phone]/ [phone]

## 2017-01-28 NOTE — Progress Notes
pt not seen today; chart reviewed    - HD next on 12/21; (plan for M/W/F)    - URR on 12/19 of 54%    - increase dialyzer size and time on HD and will recheck URR on 12/21   - ID managing abx   - hematology managing anticoagulation    - iron studies reviewed; start procrit 10K units 3x/week (ordered)   - d/c'ed calcium based phosphate binder; cont. sevelamer

## 2017-01-28 NOTE — Case Management (ED)
Case Management Progress Note    NAME:Debra Lam                          MRN: [id number]              DOB:September 13, 1995          AGE: 21 y.o.  ADMISSION DATE: 01/12/2017             DAYS ADMITTED: LOS: 16 days      Today???s Date: 01/28/2017    Plan  DC home today with family support and outpatient HD at [facility].  IV abx to be completed with dialysis treatment. Transportation to dialysis set with [place] Medicaid transport.  Patient and mother will need medicaid transportation home at DC. NCM task CMA to assist.    Interventions  ? Support   Support: Pt/Family Updates re:POC or DC Plan   NCM met with patient and patient's mother along with Med-E team. Discuss plan for DC. Pt and mother verbalize understanding and plan for DC home today.  NCM discuss dialysis schedule and transportation. NCM discuss ongoing PD catheter care, and plan for weekly PD catheter flush. Pt verbalize understanding.  ? Info or Referral      ? Discharge Planning   Discharge Planning: OP HD or PD   NCM contact [facility] facility manager Allysa regarding plan for DC and dialysis SOC with IV abx. NCM confirm [facility] will draw weekly labs and send to ID clinic. NCM discuss plan for PD catheter maintenance with flush and drain weekly. Orders included in dialysis/IV abx order. NCM confirm patient's transportation is set for Monday with dialysis facility.  NCM fax signed orders and clinical information including recent renal notes, ID note, and AVS to [facility]. NCM send dialysis flowsheets and additional clinical information via O2.  NCM update [facility] and [facility] regarding plan for IV abx and labs at [facility] dialysis.      ? Medication Needs      ? Financial   Financial: Medicare/Medicaid/SSDI Information  ? Legal      ? Other        Disposition  ? Expected Discharge Date    Expected Discharge Date: 01/31/17  ? Transportation   Does the patient need discharge transport arranged?: No Transportation Name, Phone and Availability #1: Johny Drilling (brother) - [phone] or Asher Muir (sister-in-law) - [phone]  ? Next Level of Care (Acute Psych discharges only)      ? Discharge Disposition                                          Durable Medical Equipment      No service has been selected for the patient.      [place] Destination      No service has been selected for the patient.      [facility]      No service has been selected for the patient.       Dialysis/Infusion - Selection Complete      Service Provider Request Status Selected Services Address Phone Number Fax Number    [facility] Selected [facility] [address], [place] [address] [phone]          Ellamae Sia RN BSN  Integrated Nurse Case Manager  [phone]/ [phone]

## 2017-01-28 NOTE — Procedures
Order for PD flush. Initial effluent dark cherry red. After installation of flush (1000 mL) and subsequent drain, effluent paler in color but light cherry red. No clots noted

## 2017-01-28 NOTE — Progress Notes
No overnight events. No fevers or chills. Completed HD today.  Reporting nausea but this occurs after each HD session. Abdomen still hurts but no other complaints. Vitals, exam, and labs stable.  Discussed with renal and ID. Ok for discharge.  She will arrange for follow-up with Dr. Wilford GristBrandon Tackett in 1-2 weeks.  Dr. Nelda SevereWaller of ID will arrange for follow-up.  She will have weekly labs faxed to PCP and Dr. Nelda SevereWaller while on antibiotics.  All medications were reviewed and refilled as needed.  Over 45 minutes was spent in discharge care coordination and education.

## 2017-01-28 NOTE — Discharge Instructions
Continue vitamins, multi B, C, Zn & folate (Renal) (NEPHPLEX RX) tablet 1 tablet daily. Recommend speaking with provider of dialysis regarding vitamin D lab levels for deficiency. If low, recommend supplementation per dialysis recommendations and/or dietitian recommendations.     Please don't hesitate to contact your registered dietitian if needed:   Contact information:  Konrad SahaJosie Diekemper, MS, RD, LD  [phone] - [email or web address]

## 2017-01-28 NOTE — Progress Notes
Renal Progress Note    Name:  Debra Lam   EAVWU'[id number] Date:  01/28/2017  Admission Date: 01/12/2017  LOS: 16 days          Assessment and Plan   Principal Problem:    CLABSI (central line-associated bloodstream infection)  Active Problems:    ESRD (end stage renal disease) on dialysis (HCC)    Morbid obesity (HCC)    HTN (hypertension)    OSA (obstructive sleep apnea)    Anemia    Acute deep vein thrombosis (DVT) of femoral vein of right lower extremity (HCC)        Debra Lam is a 21 y.o. 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  Sepsis  - febrile, leukocytosis, and tachycardic on admission               - has port, RLE tunneled HD (pulled), and PD catheter  Group B Strep bacteremia and S. aureus growing from [facility] tip, which was d/c 12/6  ???  Retroperitoneal bleed  - CT abd 12/12 with retroperitoneal bleed --> STAT IR: Right renal artery: Widely patent right renal artery. Multiple small areas of active contrast extravasation involving superior pole, interpolar, and lower pole renal artery branches s/p embolization  - CT abd 12/15  redistribution of the hematoma but no expansions.  A small amount of fluid was seen which was considered a dilute hemoperitoneum.  ???  DVT  - Partially occlusive acute appearing thrombus within the right external iliac and right common femoral veins  ???  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 SOB: +ve  parainfluenza - resolved   ???  HTN  OSA  Hx of PE  - 2016, likely provoked???while on OCP  - Was on eliquis, not currently on anticoagulation  ???  Iron deficiency anemia  Tsat 10%, ferritin 610 likely due to acute infection  ???  Hemoperitoneum  ???  Recommendations:  - Given unclear etiology for spontaneous bleeding from renal artery branches (on anticoagulation) --> would re-image with CT abd/pelvis in future once hemorrhage has resorbed to exclude underlying abnormalities, such as renal cell carcinoma    - ID managing abx  - long term dialysis plan is very complicated and decision making is complex. Will keep PD catheter for now as she has sever vasculopathy and are concerned if she looses her T femoral CVC her access options are becoming more limited. Will discuss case with outpatient nephrologist.  - plan for HD today, 12/21 with PD flush and drain   - discussed care with primary team    pt discussed with Dr. Holley Raring, MD  Nephrology fellow  Pager [phone]      Subjective     Debra Lam is a 21 y.o. female   Seen on HD; pt reports improvement in abd pain. denies any new acute complaints.         Medications  Medications    MEDS    amLODIPine 10 mg Oral QDAY   apixaban 2.5 mg Oral BID   carvedilol 6.25 mg Oral BID   ceFAZolin  (ANCEF)  IV 3 g Intravenous ONCE   cetirizine 5 mg Oral QAM8   cyclobenzaprine 5 mg Oral TID   dialysate w/1.5% dex (ULTRA) 2 L Intraperitoneal PD x1 Bag   epoetin 10,000 Units Subcutaneous Once per day on Mon Wed Fri   heparin 500 Units Intra-catheter ONCE   heparin   NOW   lidocaine 1 patch Topical QDAY   lidocaine 1 patch Topical QDAY   lidocaine 1 patch Topical QDAY   pantoprazole DR 40 mg Oral BID(11-21)   senna/docusate 10 mL Oral BID   sevelamer carbonate 800 mg Oral TID w/ meals   vitamins, multi B, C, Zn & folate (Renal) 1 tablet Oral QDAY    IV MEDS    Prn emu PRN, fentaNYL citrate PF Q12H PRN, HYDROcodone/acetaminophen Q4H PRN 2 tablet at 01/28/17 0300, melatonin QHS PRN 3 mg at 01/27/17 2246, ondansetron (ZOFRAN) IV Q6H PRN 4 mg at 01/27/17 1423, promethazine Q6H PRN 25 mg at 01/26/17 1955, sodium chloride 0.9% (NS) IP Dialysis PRN, sodium chloride 0.9% (NS) IP Dialysis PRN           Physical Exam     Vital Signs: Last Filed In 24 Hours Vital Signs: 24 Hour Range   BP: 138/86 (12/21 1159)  Temp: 37.6 ???C (99.7 ???F) (12/21 1159)  Pulse: 123 (12/21 1159)  Respirations: 20 PER MINUTE (12/21 1159)  SpO2: 95 % (12/21 1159)  O2 Delivery: None (Room Air) (12/21 1159)  SpO2 Pulse: 119 (12/21 1130) BP: (117-148)/(82-102)   Temp:  [36.4 ???C (97.5 ???F)-37.6 ???C (99.7 ???F)]   Pulse:  [103-125]   Respirations:  [16 PER MINUTE-25 PER MINUTE]   SpO2:  [94 %-99 %]   O2 Delivery: None (Room Air)   Intensity Pain Scale (Self Report): 0 (01/28/17 1130)        Intake/Output Summary (Last 24 hours) at 01/28/2017 1254  Last data filed at 01/28/2017 1200  Gross per 24 hour   Intake 857 ml   Output 3300 ml   Net -2443 ml      Vitals:    01/26/17 1830 01/28/17 0629 01/28/17 1130   Weight: 112.4 kg (247 lb 12.8 oz) 111.2 kg (245 lb 2.4 oz) 108.4 kg (238 lb 15.7 oz)       Gen: Alert and Oriented, No Acute Distress   HEENT: Sclera normal; MMM, neck keloid  CV: no JVD, S1 and S2 normal, no rubs, murmurs or gallops   Pulm: Clear to Auscultation bilateral   GI: BS+ x4,  Mildly tender to palpation, + PD   Neuro: Grossly normal, moving all extremities, speech intact  Ext: no edema, clubbing or cyanosis   Skin: no rash   + femoral HD CVC      Labs:      Recent Labs      01/26/17   0059  01/26/17   1710  01/27/17   0405  01/28/17   0304  01/28/17   1115   NA  136*  135*  135*  134*   --    K  3.9  3.5  3.8  3.7   --    CL  99  97*  97*  96*   --    CO2  26  30  28  26    --  GAP  11  8  10  12    --    BUN  26*  12  15  26*  7   CR  10.49*  5.08*  6.72*  9.40*   --    GLU  95  92  115*  103*   --    CA  8.2*  8.8  8.7  8.2*   --    ALBUMIN  3.0*  3.2*  3.0*  2.9*   -- PO4  5.5*   --   4.9*  6.5*   --        Recent Labs      01/25/17   1700  01/25/17   1845  01/26/17   0059  01/26/17   0805  01/26/17   0915  01/26/17   1600  01/26/17   1710  01/26/17   2204  01/27/17   0405  01/27/17   0520  01/27/17   1415  01/27/17   2132  01/28/17   0304  01/28/17   0800  01/28/17   1100   WBC  9.6   --   10.3   --    --   9.6   --    --   11.4*   --   11.3*   --   10.7  10.3   --    HGB  8.1*   --   8.0*   --    --   8.5*   --    --   8.2*   --   8.3*   --   7.8*  9.5*   --    HCT  23.9*   --   24.1*   --    --   25.6*   --    --   24.3*   --   25.0*   --   23.4*  28.0*   --    PLTCT  311   --   312   --    --   331   --    --   320   --   367   --   357  424*   --    INR   --    --   2.8*   --    --    --    --    --   2.7*   --    --    --   1.5*   --    --    PTT   --   38.3*  46.7*  168.9*  48.8*  66.3*   --   65.7*  140.2*  55.2*  71.3*  62.7*  91.7*   --   47.9*   AST   --    --   14   --    --    --   15   --   15   --    --    --   14   --    --    ALT   --    --   <3*   --    --    --   3*   --   <3*   --    --    --   <3*   --    --    ALKPHOS   --    --  81   --    --    --   94   --   83   --    --    --   84   --    --       Estimated Creatinine Clearance: 11 mL/min (A) (based on SCr of 9.4 mg/dL (H)).  Vitals:    01/26/17 1830 01/28/17 0629 01/28/17 1130   Weight: 112.4 kg (247 lb 12.8 oz) 111.2 kg (245 lb 2.4 oz) 108.4 kg (238 lb 15.7 oz)    No results for input(s): PHART, PO2ART in the last 72 hours.    Invalid input(s): PC02A

## 2017-01-28 NOTE — Case Management (ED)
Medicaid Transportation Summary for [employer]lem[employer]    Requesting Social Worker: Donny Piqueewey Warkentin, [place] [facility] LLCRNCM  Date: 01/28/2017     Medicaid Transportation Coordinator: [employer] ([place]) (P: [phone])  Medicaid Transportation Representative: Nicole Cellaorothy  Destination: [address], [place] [id number]  DME: None  Additional Travelers: Yes: Mom  Transportation Arrival Window: 1:16 P.M. - 4:16 P.M  Driver Instructed to Contact Unit 53 (P: [phone]) 15 Minutes Prior to Arrival: Yes  Patient to be Waiting in LobbyValentino Hue: Yes  Trip Reservation Number: [phone]    Notified Unit Secretary Dondra SpryGail and provided update.    Updated SWCM.    Darolyn RuaJanelle Muskan Bolla  Case Management Assistant  For additional assistance, please contact Donny PiqueDewey Warkentin, RNCM [phone]

## 2017-01-28 NOTE — Progress Notes
CLINICAL NUTRITION                                                        Clinical Nutrition Follow-Up Summary     NAME:Debra Lam             MRN: [id number]             DOB:1995/04/06          AGE: 21 y.o.  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 On 12/12 patient developed increasing abdominal pain and was seen on CT abdomen with right pararenal space hematoma with active bleed and hypotension requiring rapid response, IR intervention, and transfer to MICU for further care. IVC filter placement and tunneled HD catheter placement 12/13, and s/p HD 12/14, 12/17. Noted very poor PO intake 12/11-12/14. Refusing PO intake today and sleeping soundly/lethargic at last RD visit 12/14 but appears to have had good PO intake earlier in admit. Seen by outpatient RD in October and noted good appetite, noncompliance with renal diet. Patient sleeping soundly today, snoring. Spoke with family at bedside 12/18, she reports patient consuming ~ 1 meal per day and not liking nutrition supplements. RD offered education to family but not interested as patient has adequate education. RD continued to encourage PO intake, family understood importance. Pt with 2 stage ll pressure injuries, left buttocks PI resolved 12/15, stage 2 still active. Renal MVI and renvela on board. BLE trace edema noted. +BM 12/20. Today, RD noted that d/c orders complete, patient to d/c within the hour. Spoke with mom at bedside as patient sleeping. Mom reports patient has not been taking vitamin D supplements and does not know of a hx of low vit d levels. RD encouraged mom to follow up with nephrologist regarding vitamin D lab levels and supplement if low. These recs were provided in d/c orders. RD will follow-up with patient if patient remains inpatient status.     Recommendation:  ??? REC cont renal diet; please offer [employer] Renal supplements.   ??? If PO intake does not improve, REC to liberalize diet (if medically able) to low-sodium.   ??? Please enocurage patient to consume a protein source at each meal.                               ??? Continue nephlex supplementation    Intervention / Plan:  Monitor weight, labs, meds, GI symptoms, PO intake  Spoke with mother regarding need for vitamin d labs/possible supplementation if low.        Nutrition Diagnosis:  Inadequate protein-energy intake  Etiology: medical status, altered GI function   Signs &  Symptoms: pt, RN report, EMR                       Goals:  Patient to consume >50% of meals  Time Frame: Within 48 Hours  Status: Not met;Ongoing    Konrad Saha, MS, RD, LD    Phone: [phone] - Pager: [phone]*

## 2017-01-31 ENCOUNTER — Encounter: Admit: 2017-01-31 | Discharge: 2017-01-31 | Payer: MEDICARE

## 2017-01-31 NOTE — Telephone Encounter
Infectious Diseases reconciliation note from [place] discharge on 01/28/17    Per Dr. Nelda SevereWaller,    1. Antibiotic orders:  Cefazolin 2g IV after HD on Tuesdays                2g IV after HD on Thursdays                3g IV after HD on Saturdays     end date ~03/14/17. Confirmed with Darl PikesSusan at [facility].    2. Weekly labs to be drawn every Tuesday , with start date 01/31/17: CBC w/Diff, CMP. Fax all lab results to Dr. Nelda SevereWaller ID at [phone]. Confirmed with Darl PikesSusan at [facility].     3. Weekly HD Cath care per protocol.     4. Follow up: TBD.

## 2017-02-02 ENCOUNTER — Encounter: Admit: 2017-02-02 | Discharge: 2017-02-02 | Payer: MEDICARE

## 2017-02-02 MED FILL — SEVELAMER CARBONATE 800 MG PO TAB: 800 mg | ORAL | 80 days supply | Qty: 270 | Fill #2 | Status: CP

## 2017-02-02 MED FILL — APIXABAN 2.5 MG PO TAB: 2.5 mg | ORAL | 81 days supply | Qty: 180 | Fill #2 | Status: CP

## 2017-02-02 NOTE — Telephone Encounter
02/02/17 - Records have been requested per scheduler. Records are being requested from PCP. sjg    Dr. Wilford GristBrandon Tackett (F) [phone]

## 2017-02-04 ENCOUNTER — Encounter: Admit: 2017-02-04 | Discharge: 2017-02-04 | Payer: MEDICARE

## 2017-02-04 NOTE — Telephone Encounter
[facility]. Only a cbc was drawn this week, starting 02/07/17 a cbc w/diff and cmp will be drawn weekly every Tuesday thereafter.

## 2017-02-06 ENCOUNTER — Ambulatory Visit: Admit: 2017-02-06 | Discharge: 2017-02-06 | Payer: MEDICARE

## 2017-02-07 ENCOUNTER — Encounter: Admit: 2017-02-07 | Discharge: 2017-02-07 | Payer: MEDICARE

## 2017-02-07 MED ORDER — APIXABAN 2.5 MG PO TAB
2.5 mg | ORAL_TABLET | Freq: Two times a day (BID) | ORAL | 0 refills | Status: CN
Start: 2017-02-07 — End: ?

## 2017-02-10 ENCOUNTER — Encounter: Admit: 2017-02-10 | Discharge: 2017-02-10 | Payer: MEDICARE

## 2017-02-10 DIAGNOSIS — K279 Peptic ulcer, site unspecified, unspecified as acute or chronic, without hemorrhage or perforation: ICD-10-CM

## 2017-02-10 DIAGNOSIS — N19 Unspecified kidney failure: Principal | ICD-10-CM

## 2017-02-10 DIAGNOSIS — Z9009 Acquired absence of other part of head and neck: ICD-10-CM

## 2017-02-10 DIAGNOSIS — G4730 Sleep apnea, unspecified: ICD-10-CM

## 2017-02-10 DIAGNOSIS — S301XXD Contusion of abdominal wall, subsequent encounter: ICD-10-CM

## 2017-02-10 DIAGNOSIS — F5089 Other specified eating disorder: ICD-10-CM

## 2017-02-10 DIAGNOSIS — K611 Rectal abscess: ICD-10-CM

## 2017-02-10 DIAGNOSIS — F329 Major depressive disorder, single episode, unspecified: ICD-10-CM

## 2017-02-10 DIAGNOSIS — I10 Essential (primary) hypertension: ICD-10-CM

## 2017-02-10 DIAGNOSIS — I2699 Other pulmonary embolism without acute cor pulmonale: ICD-10-CM

## 2017-02-10 MED ORDER — EPOETIN ALFA 10,000 UNIT/ML IJ SOLN
10000 [IU] | SUBCUTANEOUS | 0 refills | Status: DC
Start: 2017-02-10 — End: 2017-02-13
  Administered 2017-02-11 – 2017-02-13 (×2): 10000 [IU] via SUBCUTANEOUS

## 2017-02-10 MED ORDER — PROMETHAZINE 25 MG PO TAB
25 mg | ORAL | 0 refills | Status: DC | PRN
Start: 2017-02-10 — End: 2017-02-13

## 2017-02-10 MED ORDER — SODIUM CHLORIDE 0.9 % IJ SOLN
50 mL | Freq: Once | INTRAVENOUS | 0 refills | Status: CP
Start: 2017-02-10 — End: ?
  Administered 2017-02-11: 04:00:00 50 mL via INTRAVENOUS

## 2017-02-10 MED ORDER — CEFAZOLIN 1 GRAM IJ SOLR
2 g | Freq: Once | INTRAVENOUS | 0 refills | Status: CP
Start: 2017-02-10 — End: ?
  Administered 2017-02-11 (×2): 2000 mg via INTRAVENOUS

## 2017-02-10 MED ORDER — HYDROCODONE-ACETAMINOPHEN 5-325 MG PO TAB
1 | ORAL | 0 refills | Status: DC | PRN
Start: 2017-02-10 — End: 2017-02-13
  Administered 2017-02-11 – 2017-02-13 (×4): 1 via ORAL

## 2017-02-10 MED ORDER — PANTOPRAZOLE 40 MG PO TBEC
40 mg | Freq: Two times a day (BID) | ORAL | 0 refills | Status: DC
Start: 2017-02-10 — End: 2017-02-13
  Administered 2017-02-11 – 2017-02-13 (×5): 40 mg via ORAL

## 2017-02-10 MED ORDER — CALCITRIOL 0.5 MCG PO CAP
1 ug | Freq: Every day | ORAL | 0 refills | Status: DC
Start: 2017-02-10 — End: 2017-02-13
  Administered 2017-02-11 – 2017-02-13 (×3): 1 ug via ORAL

## 2017-02-10 MED ORDER — SEVELAMER CARBONATE 800 MG PO TAB
800 mg | Freq: Three times a day (TID) | ORAL | 0 refills | Status: DC
Start: 2017-02-10 — End: 2017-02-13
  Administered 2017-02-12 – 2017-02-13 (×4): 800 mg via ORAL

## 2017-02-10 MED ORDER — CETIRIZINE 10 MG PO TAB
5 mg | Freq: Every morning | ORAL | 0 refills | Status: DC
Start: 2017-02-10 — End: 2017-02-13
  Administered 2017-02-11 – 2017-02-13 (×3): 5 mg via ORAL

## 2017-02-10 MED ORDER — IOHEXOL 350 MG IODINE/ML IV SOLN
75 mL | Freq: Once | INTRAVENOUS | 0 refills | Status: CP
Start: 2017-02-10 — End: ?
  Administered 2017-02-11: 04:00:00 75 mL via INTRAVENOUS

## 2017-02-11 LAB — COMPREHENSIVE METABOLIC PANEL
Lab: 0.3 mg/dL (ref 0.3–1.2)
Lab: 100 U/L (ref 25–110)
Lab: 103 mg/dL — ABNORMAL HIGH (ref 70–100)
Lab: 12 U/L (ref 7–40)
Lab: 132 MMOL/L — ABNORMAL LOW (ref 137–147)
Lab: 25 MMOL/L (ref 21–30)
Lab: 3.6 g/dL (ref 3.5–5.0)
Lab: 5 mL/min — ABNORMAL LOW (ref >60)
Lab: 5 mL/min — ABNORMAL LOW (ref >60)
Lab: 8.3 g/dL — ABNORMAL HIGH (ref 6.0–8.0)
Lab: 9 (ref 3–12)
Lab: 9.2 mg/dL — ABNORMAL HIGH (ref 8.5–10.6)
Lab: <3 U/L — ABNORMAL LOW (ref 7–56)
Lab: 130 MMOL/L — ABNORMAL LOW (ref 137–147)

## 2017-02-11 LAB — CBC
Lab: 19 % — ABNORMAL HIGH (ref 11–15)
Lab: 2.9 M/UL — ABNORMAL LOW (ref 4.0–5.0)
Lab: 28 % — ABNORMAL LOW (ref 36–45)
Lab: 286 10*3/uL (ref 150–400)
Lab: 30 pg (ref 26–34)
Lab: 31 g/dL — ABNORMAL LOW (ref 32.0–36.0)
Lab: 6.5 10*3/uL (ref 4.5–11.0)
Lab: 6.9 10*3/uL (ref 4.5–11.0)
Lab: 7.1 10*3/uL — ABNORMAL LOW (ref 4.5–11.0)
Lab: 7.3 FL (ref 7–11)
Lab: 9 g/dL — ABNORMAL LOW (ref 12.0–15.0)

## 2017-02-11 LAB — PTT (APTT): Lab: 27 s — ABNORMAL LOW (ref 24.0–36.5)

## 2017-02-11 LAB — RETICULOCYTE COUNT
Lab: 4.6 %
Lab: 7.2 % — ABNORMAL HIGH (ref 0.5–2.0)

## 2017-02-11 LAB — PHOSPHORUS: Lab: 5.3 mg/dL — ABNORMAL HIGH (ref 2.0–4.5)

## 2017-02-11 LAB — BETA-HCG: Lab: 1 U/L — ABNORMAL HIGH (ref <5)

## 2017-02-11 LAB — PROTIME INR (PT): Lab: 1.3 MMOL/L — ABNORMAL HIGH (ref 0.8–1.2)

## 2017-02-11 MED ORDER — CEFAZOLIN INJ 1GM IVP
2 g | INTRAVENOUS | 0 refills | Status: DC
Start: 2017-02-11 — End: 2017-02-13

## 2017-02-11 MED ORDER — CEFAZOLIN IVPB
3 g | INTRAVENOUS | 0 refills | Status: DC
Start: 2017-02-11 — End: 2017-02-13
  Administered 2017-02-13 (×2): 3 g via INTRAVENOUS

## 2017-02-11 MED ORDER — DIALYSATE W/1.5% DEX ULTRA 2000ML
2 L | INTRAPERITONEAL | 0 refills | Status: CP
Start: 2017-02-11 — End: ?
  Administered 2017-02-12: 01:00:00 2 L via INTRAPERITONEAL

## 2017-02-12 LAB — COMPREHENSIVE METABOLIC PANEL
Lab: 131 MMOL/L — ABNORMAL LOW (ref >60)
Lab: 94 mg/dL (ref 70–100)

## 2017-02-12 LAB — CBC: Lab: 6.5 K/UL (ref 4.5–11.0)

## 2017-02-12 MED ORDER — SODIUM CHLORIDE 0.9 % IV SOLP
300 mL | INTRAVENOUS | 0 refills | Status: CP | PRN
Start: 2017-02-12 — End: ?
  Administered 2017-02-12: 16:00:00 300 mL via INTRAVENOUS

## 2017-02-12 MED ORDER — ANTICOAG SODIUM CITRATE SOLN 5ML
1-5 mL | Freq: Once | 0 refills | Status: CP
Start: 2017-02-12 — End: ?

## 2017-02-12 MED ORDER — SODIUM CHLORIDE 0.9 % IV SOLP
1000 mL | INTRAVENOUS | 0 refills | Status: DC | PRN
Start: 2017-02-12 — End: 2017-02-12

## 2017-02-12 MED ORDER — SODIUM CHLORIDE 0.9 % IV SOLP
250 mL | INTRAVENOUS | 0 refills | Status: DC | PRN
Start: 2017-02-12 — End: 2017-02-12

## 2017-02-12 MED ORDER — ALTEPLASE 2 MG IK SOLR
4 mg | Freq: Once | 0 refills | Status: CP
Start: 2017-02-12 — End: ?
  Administered 2017-02-12: 17:00:00 4 mg

## 2017-02-12 MED ORDER — HEPARIN (PORCINE) 1,000 UNIT/ML IJ SOLN
10000 [IU] | Freq: Once | 0 refills | Status: CP
Start: 2017-02-12 — End: ?
  Administered 2017-02-12: 18:00:00 10000 [IU]

## 2017-02-13 ENCOUNTER — Inpatient Hospital Stay: Admit: 2017-02-11 | Discharge: 2017-02-11 | Payer: MEDICARE

## 2017-02-13 ENCOUNTER — Inpatient Hospital Stay
Admit: 2017-02-10 | Discharge: 2017-02-13 | Disposition: A | Discharge status: Home / Self Care | Payer: MEDICARE | Source: Ambulatory Visit

## 2017-02-13 ENCOUNTER — Encounter: Admit: 2017-02-13 | Discharge: 2017-02-13 | Payer: MEDICARE

## 2017-02-13 ENCOUNTER — Ambulatory Visit: Admit: 2017-02-10 | Discharge: 2017-02-10 | Payer: MEDICARE

## 2017-02-13 DIAGNOSIS — K661 Hemoperitoneum: ICD-10-CM

## 2017-02-13 DIAGNOSIS — Q604 Renal hypoplasia, bilateral: ICD-10-CM

## 2017-02-13 DIAGNOSIS — R109 Unspecified abdominal pain: ICD-10-CM

## 2017-02-13 DIAGNOSIS — Z992 Dependence on renal dialysis: ICD-10-CM

## 2017-02-13 DIAGNOSIS — B373 Candidiasis of vulva and vagina: ICD-10-CM

## 2017-02-13 DIAGNOSIS — T8612 Kidney transplant failure: ICD-10-CM

## 2017-02-13 DIAGNOSIS — R000 Tachycardia, unspecified: ICD-10-CM

## 2017-02-13 DIAGNOSIS — Z7901 Long term (current) use of anticoagulants: ICD-10-CM

## 2017-02-13 DIAGNOSIS — Z86711 Personal history of pulmonary embolism: ICD-10-CM

## 2017-02-13 DIAGNOSIS — G4733 Obstructive sleep apnea (adult) (pediatric): ICD-10-CM

## 2017-02-13 DIAGNOSIS — R0602 Shortness of breath: ICD-10-CM

## 2017-02-13 DIAGNOSIS — I120 Hypertensive chronic kidney disease with stage 5 chronic kidney disease or end stage renal disease: ICD-10-CM

## 2017-02-13 DIAGNOSIS — Z86718 Personal history of other venous thrombosis and embolism: ICD-10-CM

## 2017-02-13 DIAGNOSIS — Z6839 Body mass index (BMI) 39.0-39.9, adult: ICD-10-CM

## 2017-02-13 DIAGNOSIS — D649 Anemia, unspecified: Principal | ICD-10-CM

## 2017-02-13 DIAGNOSIS — K219 Gastro-esophageal reflux disease without esophagitis: ICD-10-CM

## 2017-02-13 DIAGNOSIS — S301XXA Contusion of abdominal wall, initial encounter: ICD-10-CM

## 2017-02-13 DIAGNOSIS — R319 Hematuria, unspecified: ICD-10-CM

## 2017-02-13 DIAGNOSIS — R110 Nausea: ICD-10-CM

## 2017-02-13 DIAGNOSIS — I953 Hypotension of hemodialysis: ICD-10-CM

## 2017-02-13 DIAGNOSIS — R34 Anuria and oliguria: ICD-10-CM

## 2017-02-13 DIAGNOSIS — N186 End stage renal disease: ICD-10-CM

## 2017-02-13 DIAGNOSIS — N926 Irregular menstruation, unspecified: ICD-10-CM

## 2017-02-13 DIAGNOSIS — F329 Major depressive disorder, single episode, unspecified: ICD-10-CM

## 2017-02-13 DIAGNOSIS — R7881 Bacteremia: ICD-10-CM

## 2017-02-13 LAB — CBC
Lab: 19 % — ABNORMAL HIGH (ref 11–15)
Lab: 3.2 M/UL — ABNORMAL LOW (ref 4.0–5.0)
Lab: 30 % — ABNORMAL LOW (ref 36–45)
Lab: 30 pg (ref 26–34)
Lab: 32 g/dL (ref 32.0–36.0)
Lab: 7 FL (ref 7–11)
Lab: 7.9 10*3/uL (ref 4.5–11.0)
Lab: 7.9 10*3/uL — ABNORMAL LOW (ref >60)
Lab: 19 % — ABNORMAL HIGH (ref 11–15)
Lab: 2.9 M/UL — ABNORMAL LOW (ref 4.0–5.0)
Lab: 28 % — ABNORMAL LOW (ref 36–45)
Lab: 291 10*3/uL (ref 150–400)
Lab: 32 pg (ref 26–34)
Lab: 33 g/dL (ref 32.0–36.0)
Lab: 7.4 10*3/uL (ref 4.5–11.0)
Lab: 7.4 FL (ref 7–11)
Lab: 96 FL (ref 80–100)

## 2017-02-13 LAB — COMPREHENSIVE METABOLIC PANEL: Lab: 133 MMOL/L — ABNORMAL LOW (ref >60)

## 2017-02-13 LAB — D-DIMER: Lab: 120 ng{FEU}/mL — ABNORMAL HIGH (ref <500)

## 2017-02-13 MED ORDER — APIXABAN 2.5 MG PO TAB
2.5 mg | ORAL_TABLET | Freq: Two times a day (BID) | ORAL | 0 refills | Status: AC
Start: 2017-02-13 — End: ?

## 2017-02-13 MED ORDER — HYDROCODONE-ACETAMINOPHEN 5-325 MG PO TAB
1 | ORAL_TABLET | ORAL | 0 refills | 30.00000 days | Status: AC | PRN
Start: 2017-02-13 — End: ?
  Filled 2017-02-13 (×2): qty 42, 7d supply, fill #1

## 2017-02-15 ENCOUNTER — Encounter: Admit: 2017-02-15 | Discharge: 2017-02-15 | Payer: MEDICARE

## 2017-02-15 ENCOUNTER — Ambulatory Visit: Admit: 2017-02-15 | Discharge: 2017-02-15 | Payer: MEDICARE

## 2017-02-15 DIAGNOSIS — N186 End stage renal disease: Principal | ICD-10-CM

## 2017-02-15 MED ORDER — MIDAZOLAM 1 MG/ML IJ SOLN
0 refills | Status: AC
Start: 2017-02-15 — End: ?
  Administered 2017-02-15: 18:00:00 1 mg via INTRAVENOUS

## 2017-02-15 MED ORDER — FENTANYL CITRATE (PF) 50 MCG/ML IJ SOLN
0 refills | Status: AC
Start: 2017-02-15 — End: ?

## 2017-02-15 MED ORDER — ANTICOAG SODIUM CITRATE SOLN 5ML
2.5 mL | Freq: Once | 0 refills | Status: AC
Start: 2017-02-15 — End: ?

## 2017-02-15 MED ORDER — IOPAMIDOL 61 % IV SOLN
5 mL | Freq: Once | INTRA_ARTERIAL | 0 refills | Status: CP
Start: 2017-02-15 — End: ?
  Administered 2017-02-15: 18:00:00 5 mL via INTRA_ARTERIAL

## 2017-02-15 MED ORDER — FENTANYL CITRATE (PF) 50 MCG/ML IJ SOLN
0 refills | Status: AC
Start: 2017-02-15 — End: ?
  Administered 2017-02-15: 18:00:00 25 ug via INTRAVENOUS

## 2017-02-15 MED ORDER — MIDAZOLAM 1 MG/ML IJ SOLN
1-2 mg | Freq: Once | INTRAVENOUS | 0 refills | Status: CP
Start: 2017-02-15 — End: ?
  Administered 2017-02-15: 17:00:00 1 mg via INTRAVENOUS

## 2017-02-15 MED ORDER — HEPARIN, PORCINE (PF) 100 UNIT/ML IV SYRG
500 [IU] | Freq: Once | INTRAVENOUS | 0 refills | Status: AC
Start: 2017-02-15 — End: ?

## 2017-02-15 MED ORDER — FENTANYL CITRATE (PF) 50 MCG/ML IJ SOLN
50 ug | Freq: Once | INTRAVENOUS | 0 refills | Status: CP
Start: 2017-02-15 — End: ?
  Administered 2017-02-15: 17:00:00 50 ug via INTRAVENOUS

## 2017-02-17 LAB — BUN: Lab: 51

## 2017-02-17 LAB — ALK PHOS TOTAL: Lab: 93

## 2017-02-17 LAB — CREATININE: Lab: 11

## 2017-02-17 LAB — AST (SGOT): Lab: 14

## 2017-02-17 LAB — HEMOGLOBIN: Lab: 9

## 2017-02-17 LAB — ALT (SGPT): Lab: <3

## 2017-02-22 LAB — POTASSIUM: Lab: 5

## 2017-02-22 LAB — BUN: Lab: 45

## 2017-02-22 LAB — ALT (SGPT): Lab: <3

## 2017-02-22 LAB — AST (SGOT): Lab: 13

## 2017-02-24 ENCOUNTER — Encounter: Admit: 2017-02-24 | Discharge: 2017-02-24 | Payer: MEDICARE

## 2017-02-25 LAB — CBC: Lab: 6.8

## 2017-02-25 LAB — PLATELET COUNT: Lab: 228

## 2017-02-25 LAB — HEMOGLOBIN: Lab: 8.7

## 2017-02-28 ENCOUNTER — Encounter: Admit: 2017-02-28 | Discharge: 2017-02-28 | Payer: MEDICARE

## 2017-03-01 ENCOUNTER — Encounter: Admit: 2017-03-01 | Discharge: 2017-03-01 | Payer: MEDICARE

## 2017-03-01 MED ORDER — CARVEDILOL 6.25 MG PO TAB
6.25 mg | ORAL_TABLET | Freq: Two times a day (BID) | ORAL | 0 refills | 90.00000 days | Status: AC
Start: 2017-03-01 — End: ?
  Filled 2017-03-01: qty 60, 30d supply

## 2017-03-02 ENCOUNTER — Encounter: Admit: 2017-03-02 | Discharge: 2017-03-02 | Payer: MEDICARE

## 2017-03-03 ENCOUNTER — Encounter: Admit: 2017-03-03 | Discharge: 2017-03-03 | Payer: MEDICARE

## 2017-03-03 DIAGNOSIS — F5089 Other specified eating disorder: ICD-10-CM

## 2017-03-03 DIAGNOSIS — N19 Unspecified kidney failure: Principal | ICD-10-CM

## 2017-03-03 DIAGNOSIS — Z9009 Acquired absence of other part of head and neck: ICD-10-CM

## 2017-03-03 DIAGNOSIS — I2699 Other pulmonary embolism without acute cor pulmonale: ICD-10-CM

## 2017-03-03 DIAGNOSIS — G4730 Sleep apnea, unspecified: ICD-10-CM

## 2017-03-03 DIAGNOSIS — K611 Rectal abscess: ICD-10-CM

## 2017-03-03 DIAGNOSIS — F329 Major depressive disorder, single episode, unspecified: ICD-10-CM

## 2017-03-03 DIAGNOSIS — K279 Peptic ulcer, site unspecified, unspecified as acute or chronic, without hemorrhage or perforation: ICD-10-CM

## 2017-03-03 DIAGNOSIS — I10 Essential (primary) hypertension: ICD-10-CM

## 2017-03-03 LAB — PLATELET COUNT: Lab: 352

## 2017-03-03 LAB — CBC: Lab: 3.5

## 2017-03-08 LAB — ALT (SGPT): Lab: <3

## 2017-03-08 LAB — AST (SGOT): Lab: 12

## 2017-03-08 LAB — CREATININE: Lab: 11

## 2017-03-08 LAB — POTASSIUM: Lab: 5.1

## 2017-03-08 LAB — ALK PHOS TOTAL: Lab: 115

## 2017-03-08 LAB — BUN: Lab: 43

## 2017-03-08 LAB — HEMOGLOBIN: Lab: 8.9

## 2017-03-09 ENCOUNTER — Encounter: Admit: 2017-03-09 | Discharge: 2017-03-09 | Payer: MEDICARE

## 2017-03-09 ENCOUNTER — Ambulatory Visit: Admit: 2017-03-09 | Discharge: 2017-03-09 | Payer: MEDICARE

## 2017-03-11 ENCOUNTER — Encounter: Admit: 2017-03-11 | Discharge: 2017-03-11 | Payer: MEDICARE

## 2017-03-15 ENCOUNTER — Encounter: Admit: 2017-03-15 | Discharge: 2017-03-15 | Payer: MEDICARE

## 2017-03-15 LAB — ALT (SGPT): Lab: <3

## 2017-03-15 LAB — POTASSIUM: Lab: 4.7 10*3/uL (ref 0–0.20)

## 2017-03-15 LAB — CREATININE: Lab: 11 mL/min (ref >60)

## 2017-03-15 LAB — AST (SGOT): Lab: 8

## 2017-03-15 LAB — ALK PHOS TOTAL: Lab: 106 10*3/uL (ref 0–0.45)

## 2017-03-15 LAB — HEMOGLOBIN: Lab: 8.6 mL/min (ref >60)

## 2017-03-15 LAB — BUN: Lab: 69

## 2017-03-17 ENCOUNTER — Encounter: Admit: 2017-03-17 | Discharge: 2017-03-17 | Payer: MEDICARE

## 2017-03-31 ENCOUNTER — Encounter: Admit: 2017-03-31 | Discharge: 2017-03-31 | Payer: MEDICARE

## 2017-04-01 ENCOUNTER — Encounter: Admit: 2017-04-01 | Discharge: 2017-04-01 | Payer: MEDICARE

## 2017-04-07 ENCOUNTER — Ambulatory Visit: Admit: 2017-04-07 | Discharge: 2017-04-07 | Payer: MEDICARE

## 2017-05-10 ENCOUNTER — Encounter: Admit: 2017-05-10 | Discharge: 2017-05-10 | Payer: MEDICARE

## 2017-05-11 ENCOUNTER — Encounter: Admit: 2017-05-11 | Discharge: 2017-05-11 | Payer: MEDICARE

## 2017-05-12 ENCOUNTER — Encounter: Admit: 2017-05-12 | Discharge: 2017-05-12 | Payer: MEDICARE

## 2017-05-12 DIAGNOSIS — I2699 Other pulmonary embolism without acute cor pulmonale: ICD-10-CM

## 2017-05-12 DIAGNOSIS — Z9009 Acquired absence of other part of head and neck: ICD-10-CM

## 2017-05-12 DIAGNOSIS — G4730 Sleep apnea, unspecified: ICD-10-CM

## 2017-05-12 DIAGNOSIS — I10 Essential (primary) hypertension: ICD-10-CM

## 2017-05-12 DIAGNOSIS — K279 Peptic ulcer, site unspecified, unspecified as acute or chronic, without hemorrhage or perforation: ICD-10-CM

## 2017-05-12 DIAGNOSIS — K611 Rectal abscess: ICD-10-CM

## 2017-05-12 DIAGNOSIS — N19 Unspecified kidney failure: Principal | ICD-10-CM

## 2017-05-12 DIAGNOSIS — F5089 Other specified eating disorder: ICD-10-CM

## 2017-05-12 DIAGNOSIS — F329 Major depressive disorder, single episode, unspecified: ICD-10-CM

## 2017-06-22 ENCOUNTER — Encounter: Admit: 2017-06-22 | Discharge: 2017-06-22 | Payer: MEDICARE

## 2017-07-29 ENCOUNTER — Encounter: Admit: 2017-07-29 | Discharge: 2017-07-29 | Payer: MEDICARE

## 2017-08-16 ENCOUNTER — Encounter: Admit: 2017-08-16 | Discharge: 2017-08-16 | Payer: MEDICARE

## 2017-08-19 ENCOUNTER — Encounter: Admit: 2017-08-19 | Discharge: 2017-08-19 | Payer: MEDICARE

## 2017-08-19 DIAGNOSIS — Z9009 Acquired absence of other part of head and neck: ICD-10-CM

## 2017-08-19 DIAGNOSIS — F5089 Other specified eating disorder: ICD-10-CM

## 2017-08-19 DIAGNOSIS — N19 Unspecified kidney failure: Principal | ICD-10-CM

## 2017-08-19 DIAGNOSIS — F329 Major depressive disorder, single episode, unspecified: ICD-10-CM

## 2017-08-19 DIAGNOSIS — K279 Peptic ulcer, site unspecified, unspecified as acute or chronic, without hemorrhage or perforation: ICD-10-CM

## 2017-08-19 DIAGNOSIS — I10 Essential (primary) hypertension: ICD-10-CM

## 2017-08-19 DIAGNOSIS — K611 Rectal abscess: ICD-10-CM

## 2017-08-19 DIAGNOSIS — I2699 Other pulmonary embolism without acute cor pulmonale: ICD-10-CM

## 2017-08-19 DIAGNOSIS — G4730 Sleep apnea, unspecified: ICD-10-CM

## 2017-09-07 ENCOUNTER — Encounter: Admit: 2017-09-07 | Discharge: 2017-09-07 | Payer: MEDICARE

## 2017-09-21 ENCOUNTER — Encounter: Admit: 2017-09-21 | Discharge: 2017-09-21 | Payer: MEDICARE

## 2017-09-26 ENCOUNTER — Encounter: Admit: 2017-09-26 | Discharge: 2017-09-26 | Payer: MEDICARE

## 2017-09-27 ENCOUNTER — Encounter: Admit: 2017-09-27 | Discharge: 2017-09-27 | Payer: MEDICARE

## 2017-10-25 ENCOUNTER — Encounter: Admit: 2017-10-25 | Discharge: 2017-10-25 | Payer: MEDICARE

## 2017-10-27 ENCOUNTER — Encounter: Admit: 2017-10-27 | Discharge: 2017-10-27 | Payer: MEDICARE

## 2018-01-21 IMAGING — US [PERSON_NAME]
1 series · 14 of 16 positions shown · non-contrast
Comparison: none

[Series 1: us lower extrem right · 14 of 20 slices shown]
[im 1/20]
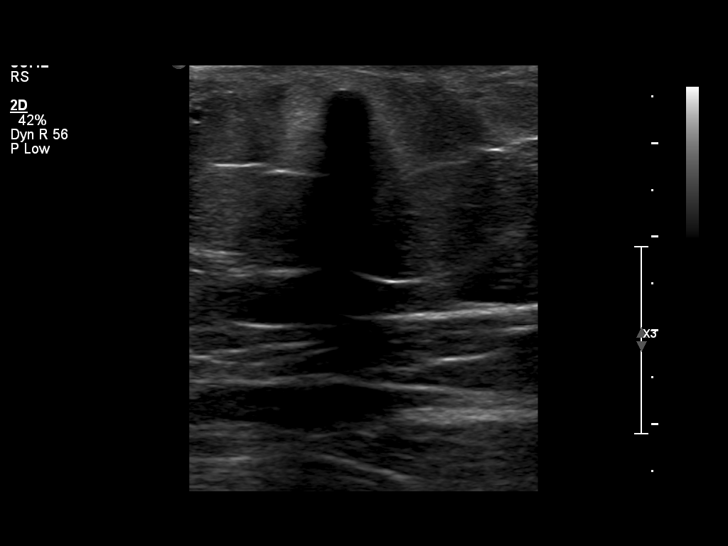
[im 2/20]
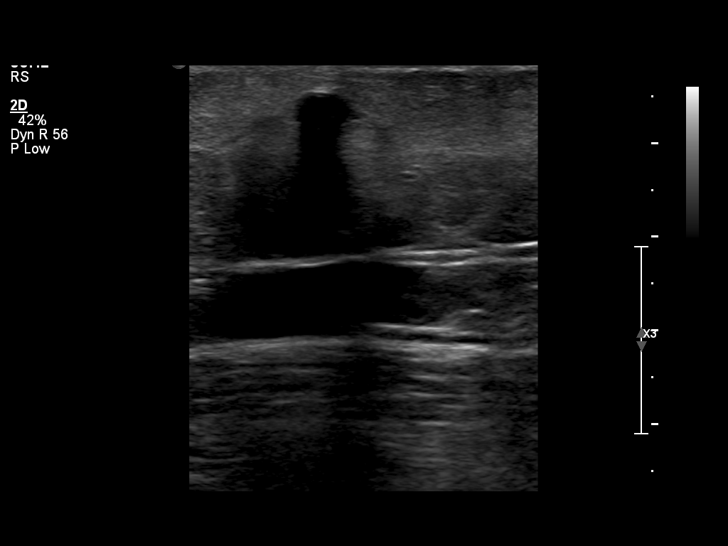
[im 3/20]
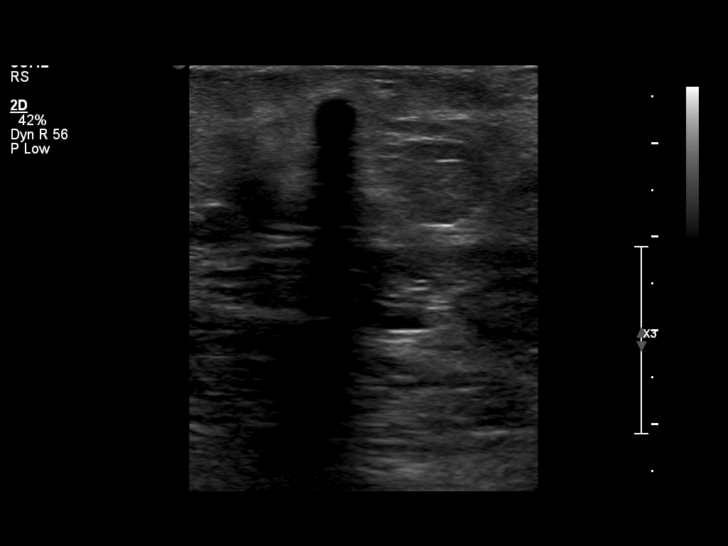
[im 6/20]
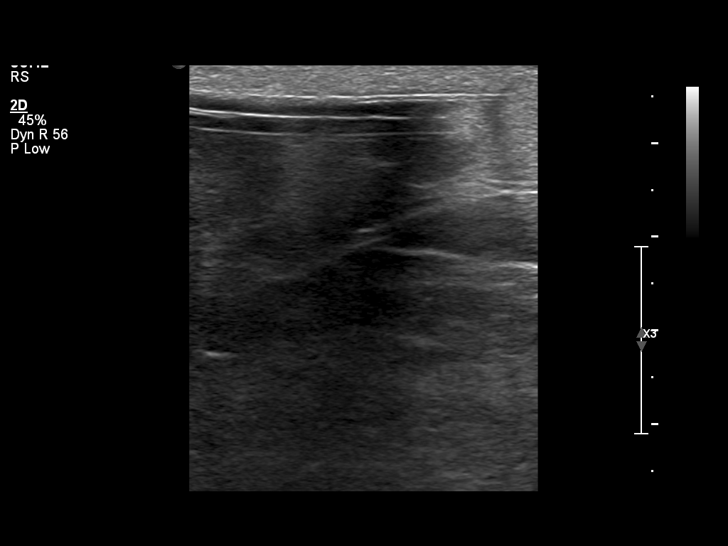
[im 7/20]
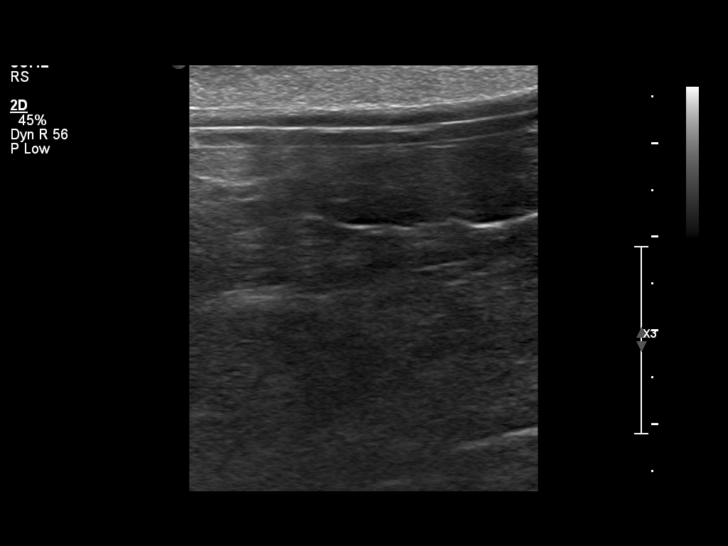
[im 8/20]
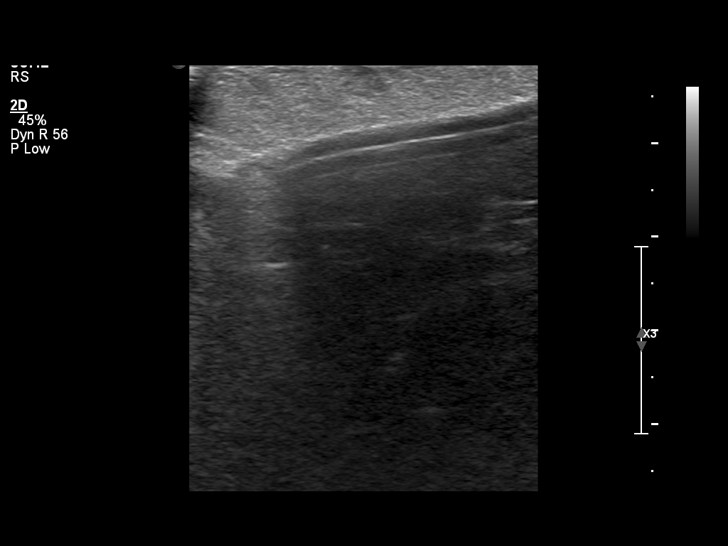
[im 9/20]
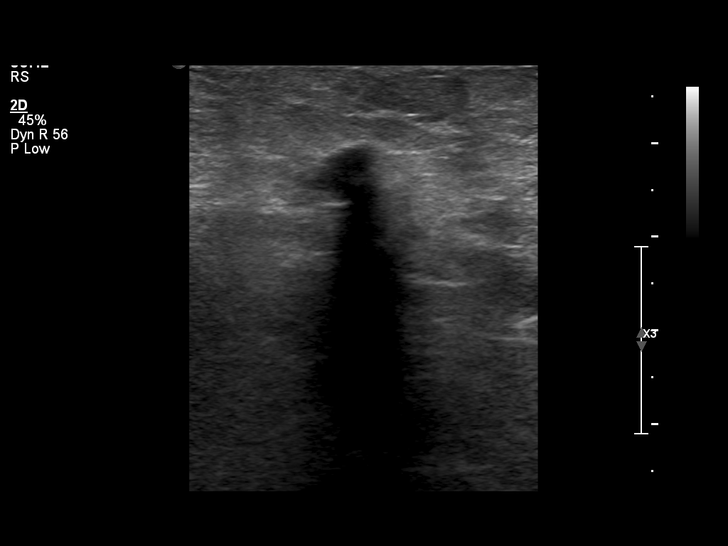
[im 11/20]
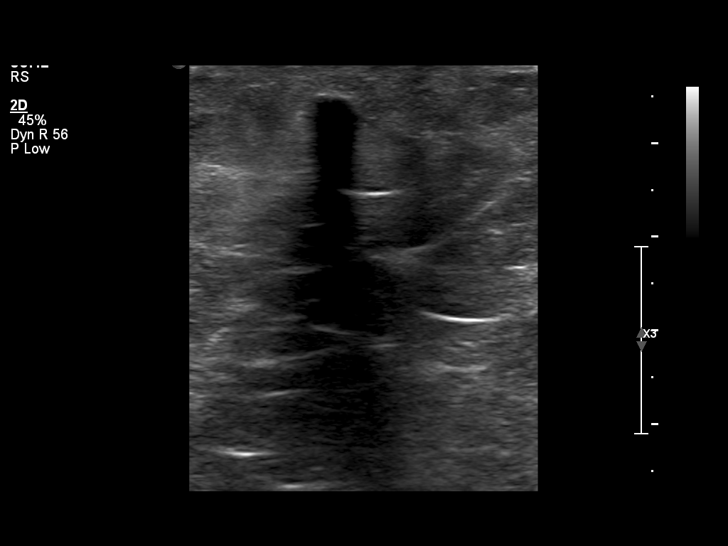
[im 12/20]
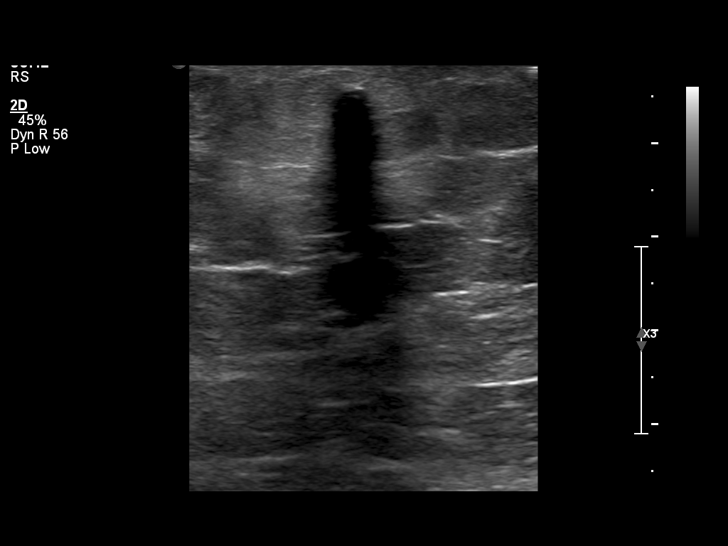
[im 13/20]
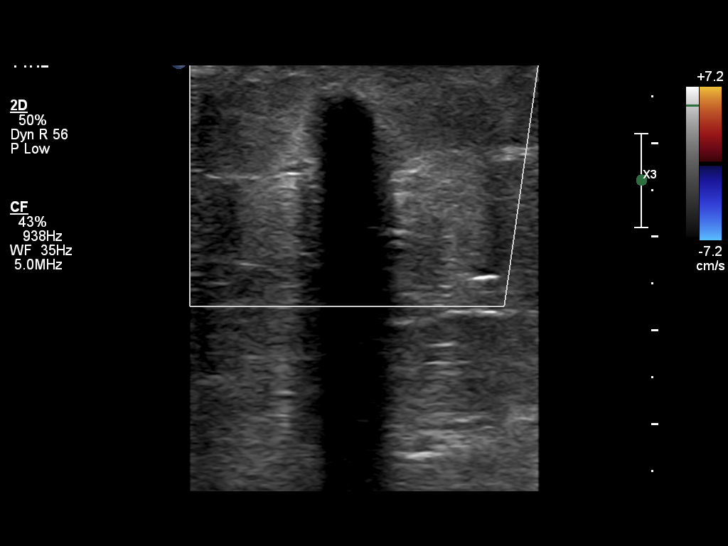
[im 16/20]
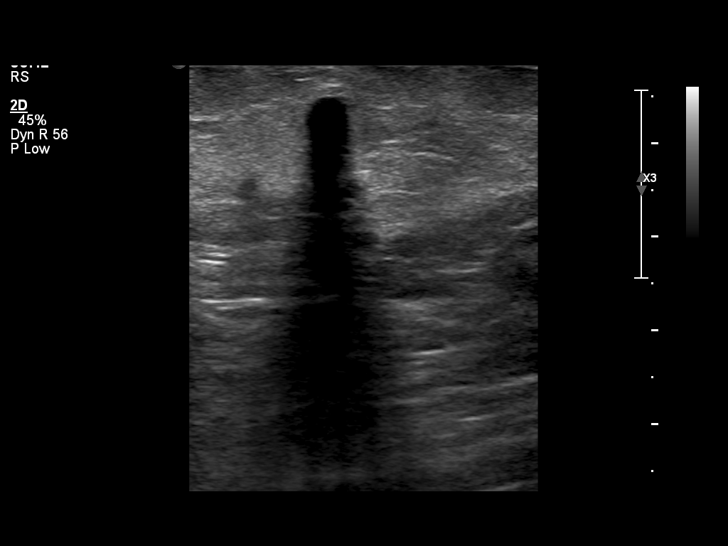
[im 17/20]
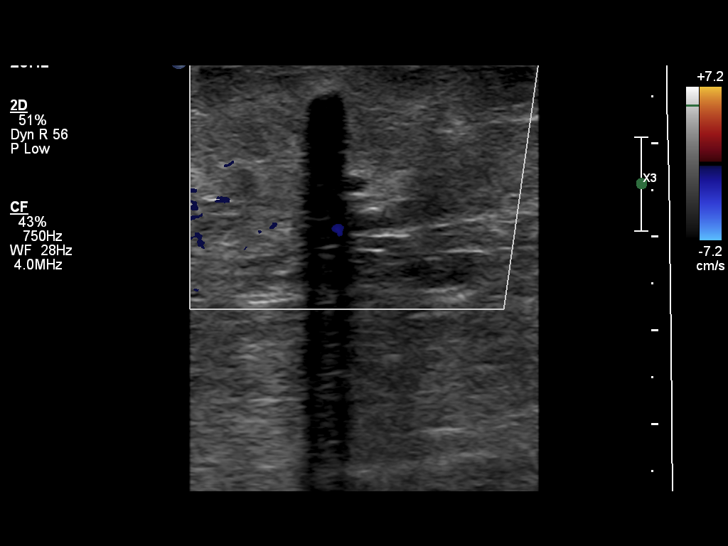
[im 18/20]
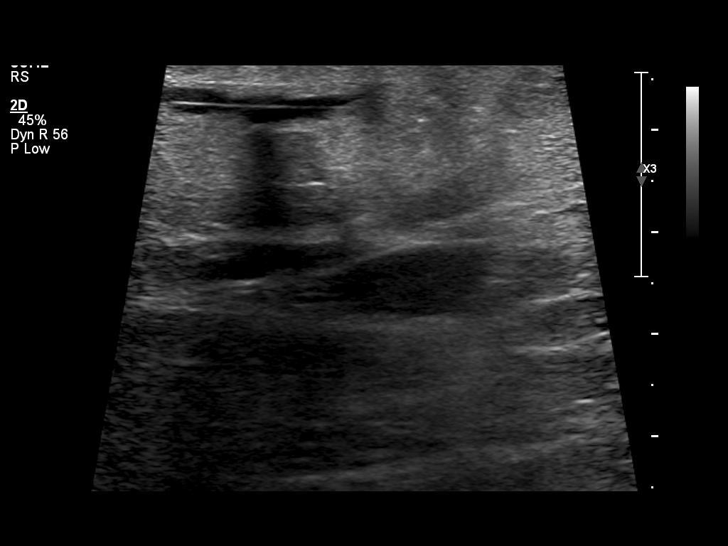
[im 20/20]
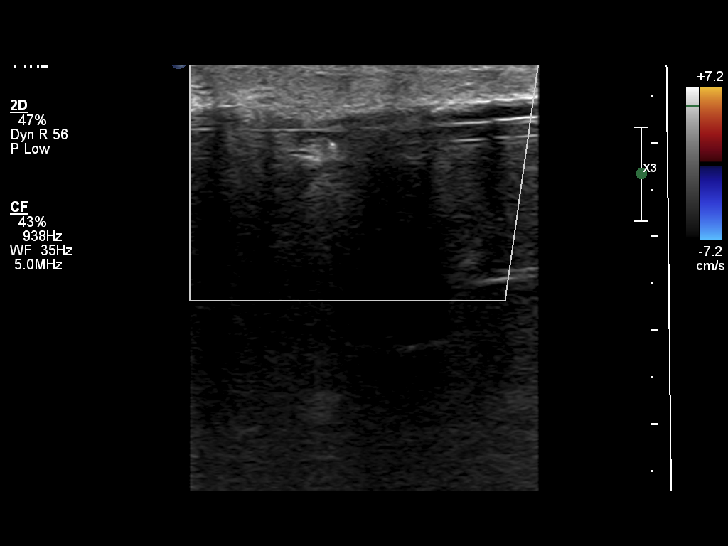

[14 of 16 positions shown; findings below may reference images not displayed]

ULTRASOUND REPORT

DIAGNOSTIC STUDIES

EXAM

Right lower extremity soft tissue ultrasound

INDICATION

wound dehiscense, hematoma over dialysis graft

TECHNIQUE

Real-time grayscale evaluation was performed

COMPARISONS

No prior studies are available for comparison.

FINDINGS

Soft tissues demonstrate no free fluid or hematoma.

IMPRESSION

No free fluid or hematoma is identified on this examination. Repeat exam as clinically needed.

## 2018-01-24 IMAGING — US LEVEINR
1 series · 14 of 16 positions shown · non-contrast
Comparison: none

[Series 1: duplex low extrem veins right · 14 of 21 slices shown]
[im 1/21]
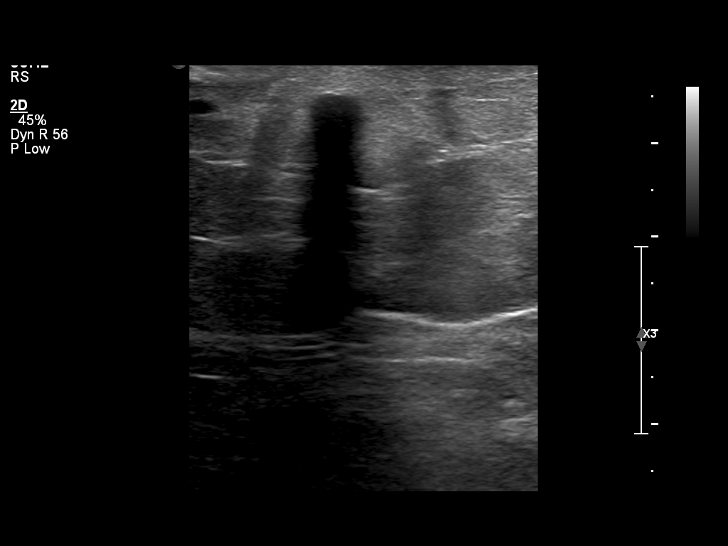
[im 2/21]
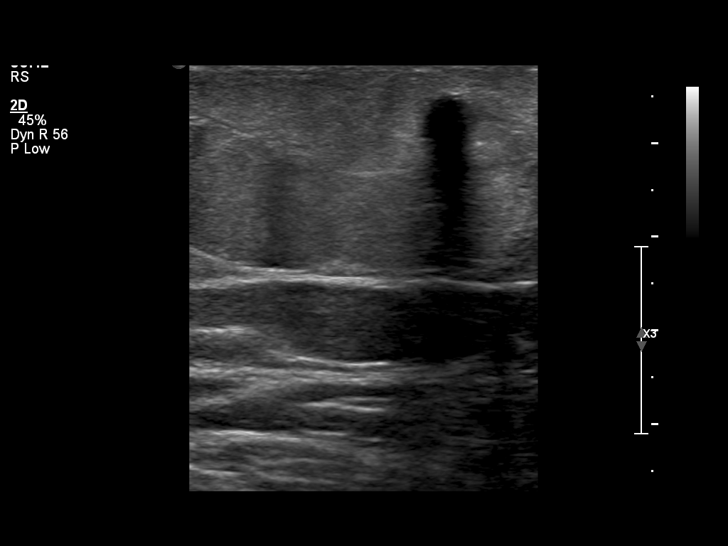
[im 3/21]
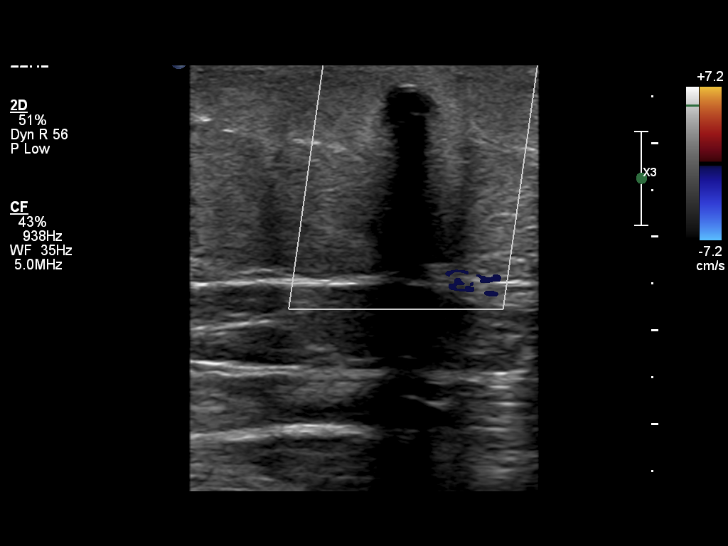
[im 6/21]
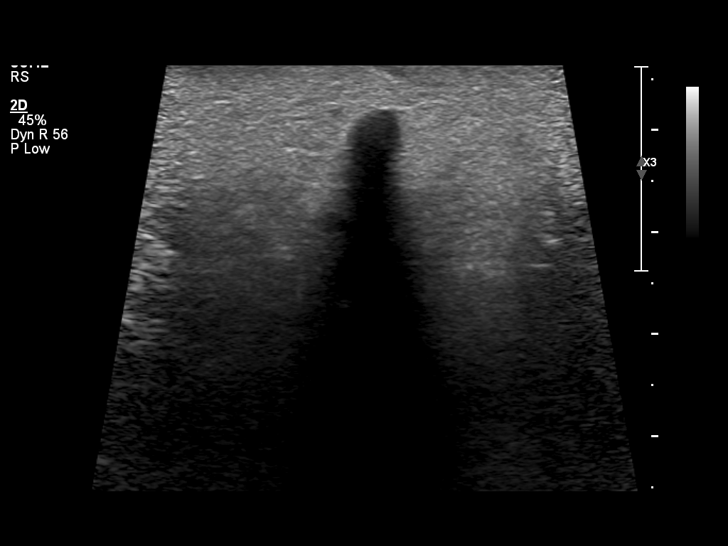
[im 7/21]
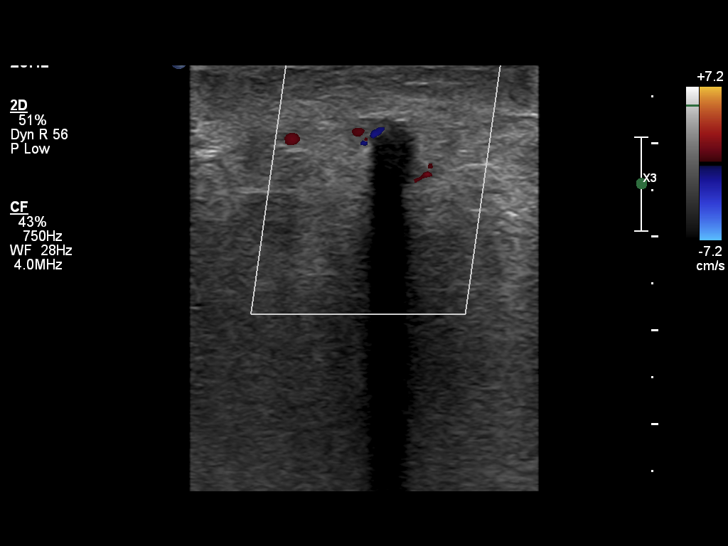
[im 9/21]
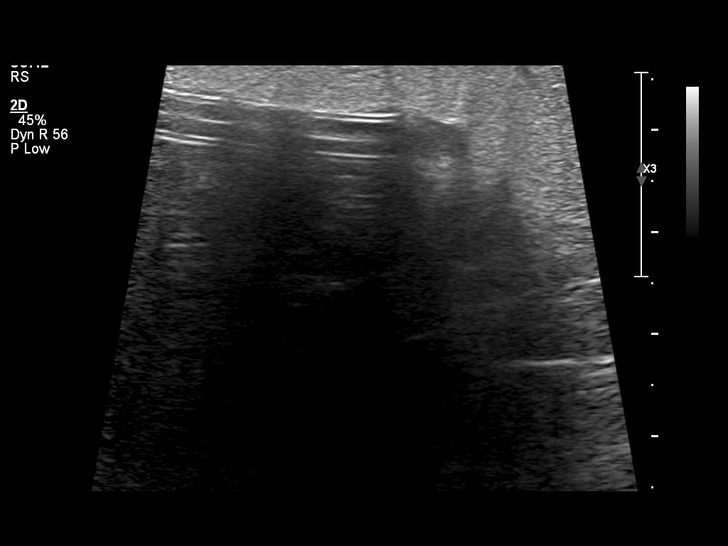
[im 10/21]
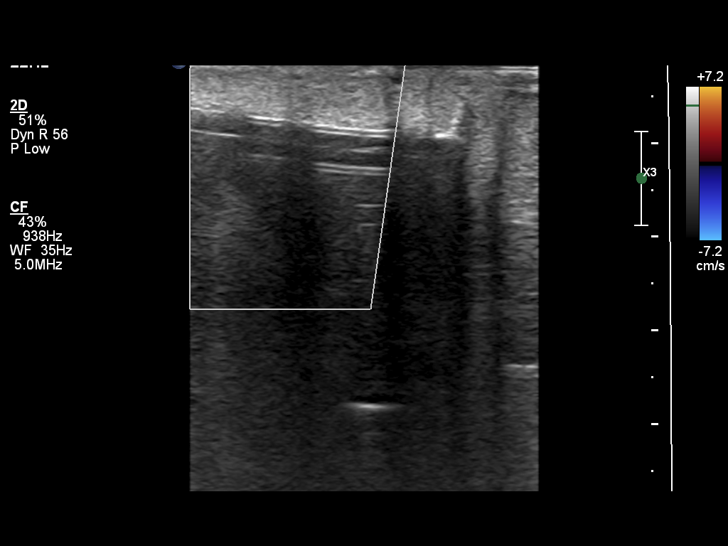
[im 11/21]
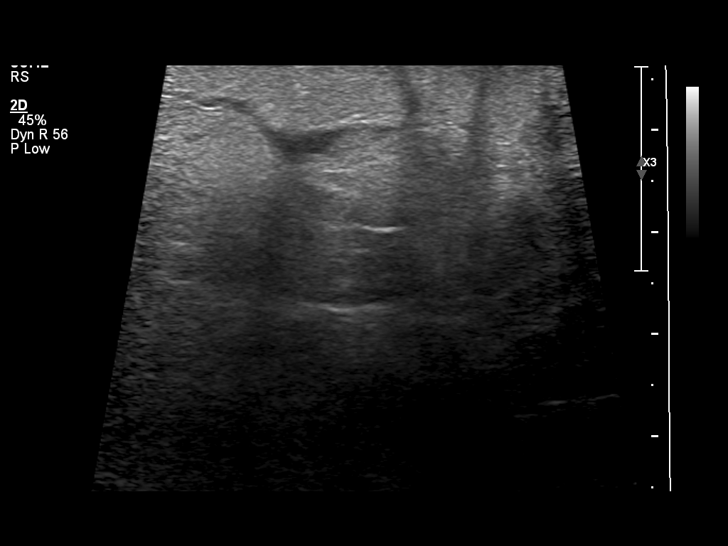
[im 13/21]
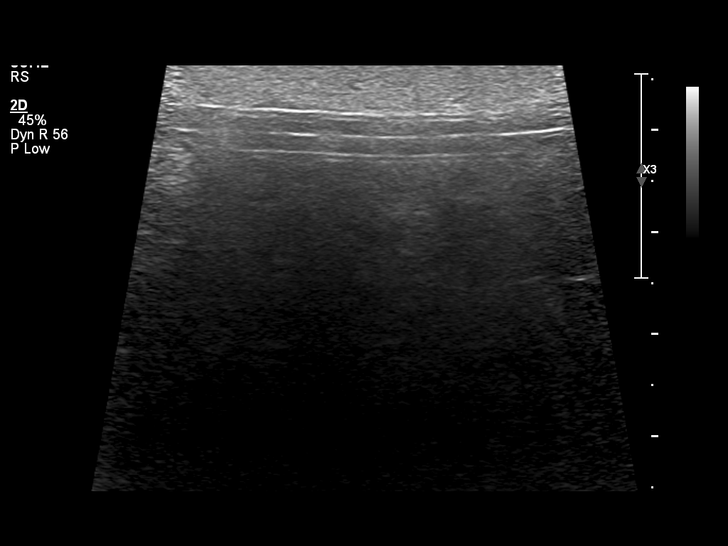
[im 14/21]
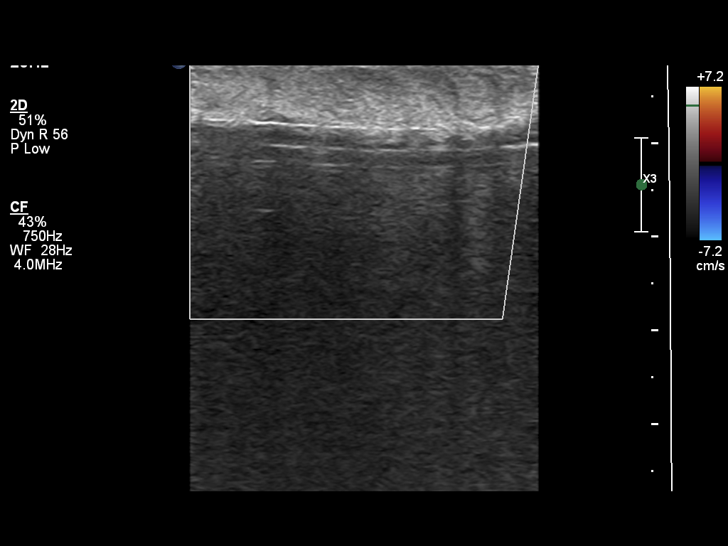
[im 17/21]
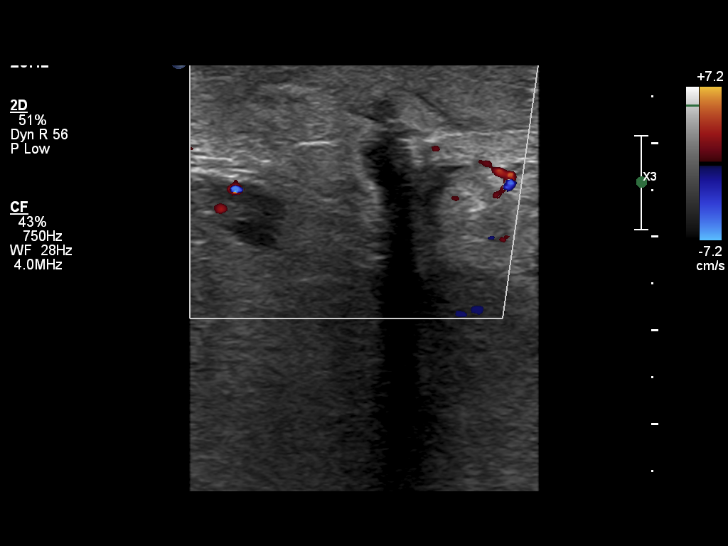
[im 18/21]
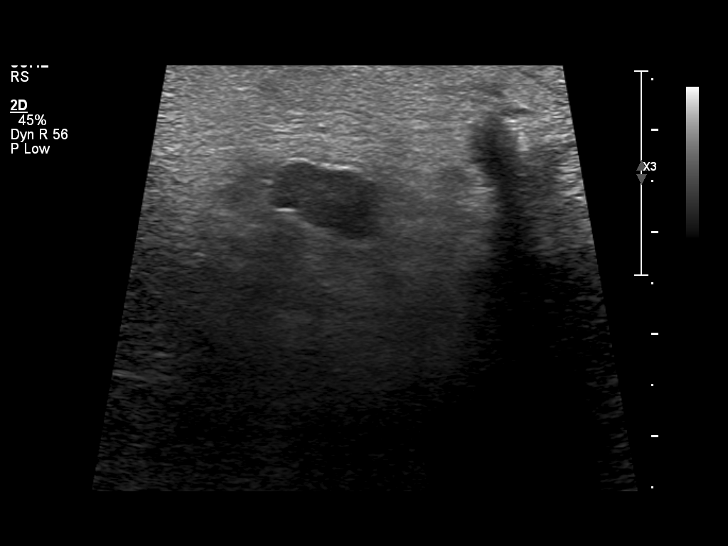
[im 19/21]
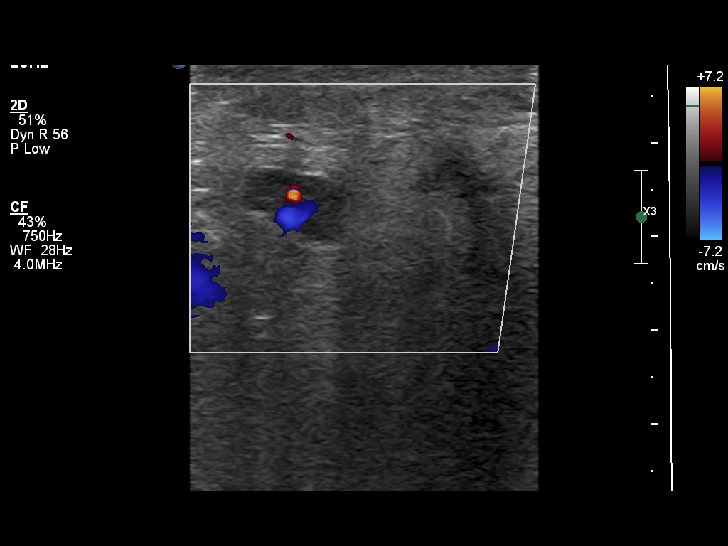
[im 21/21]
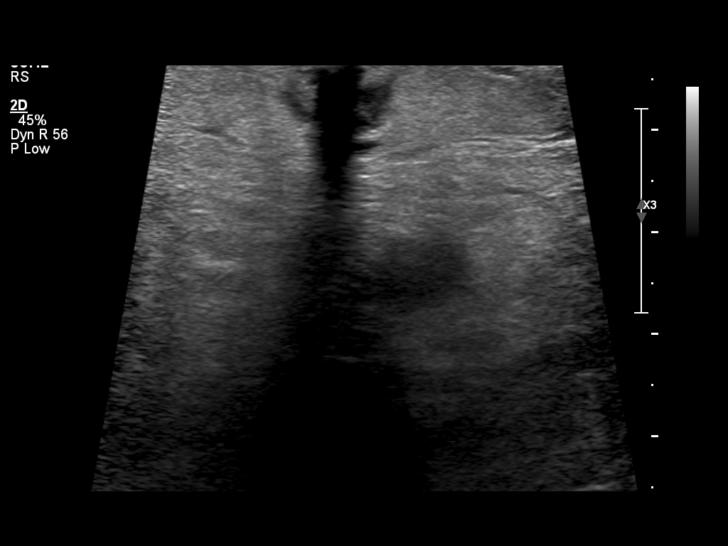

[14 of 16 positions shown; findings below may reference images not displayed]

ULTRASOUND REPORT

DIAGNOSTIC STUDIES

EXAM

INDICATION

dialysis cath R upper ant. thigh since [DATE]. Now pain and swelli

TECHNIQUE

Spiral images through the upper thighs

COMPARISONS

Previous ultrasound 01/09/2017

FINDINGS

Examination the soft tissues demonstrates no free fluid or hematoma. There is a 1.2 centimeter
normal right groin lymph node incidentally identified.

IMPRESSION

No free fluid or hematoma. Exam unchanged compared to the prior exam 01/09/2017.
